# Patient Record
Sex: Female | Born: 1985 | Race: White | Hispanic: No | Marital: Married | Smoking: Never smoker
Health system: Southern US, Community
[De-identification: ages and names within clinical notes are randomized; demographics above are authoritative.]

## PROBLEM LIST (undated history)

## (undated) ENCOUNTER — Inpatient Hospital Stay (HOSPITAL_COMMUNITY): Payer: Self-pay

## (undated) DIAGNOSIS — IMO0002 Reserved for concepts with insufficient information to code with codable children: Secondary | ICD-10-CM

## (undated) DIAGNOSIS — O09299 Supervision of pregnancy with other poor reproductive or obstetric history, unspecified trimester: Secondary | ICD-10-CM

## (undated) DIAGNOSIS — O1490 Unspecified pre-eclampsia, unspecified trimester: Secondary | ICD-10-CM

## (undated) DIAGNOSIS — R87629 Unspecified abnormal cytological findings in specimens from vagina: Secondary | ICD-10-CM

## (undated) DIAGNOSIS — B977 Papillomavirus as the cause of diseases classified elsewhere: Secondary | ICD-10-CM

## (undated) DIAGNOSIS — F419 Anxiety disorder, unspecified: Secondary | ICD-10-CM

## (undated) HISTORY — DX: Unspecified pre-eclampsia, unspecified trimester: O14.90

## (undated) HISTORY — DX: Papillomavirus as the cause of diseases classified elsewhere: B97.7

## (undated) HISTORY — DX: Supervision of pregnancy with other poor reproductive or obstetric history, unspecified trimester: O09.299

## (undated) HISTORY — PX: WISDOM TOOTH EXTRACTION: SHX21

## (undated) HISTORY — PX: NO PAST SURGERIES: SHX2092

## (undated) HISTORY — DX: Unspecified abnormal cytological findings in specimens from vagina: R87.629

## (undated) HISTORY — DX: Anxiety disorder, unspecified: F41.9

## (undated) HISTORY — DX: Reserved for concepts with insufficient information to code with codable children: IMO0002

---
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
## 2007-02-06 ENCOUNTER — Other Ambulatory Visit: Admission: RE | Admit: 2007-02-06 | Discharge: 2007-02-06 | Payer: Self-pay | Admitting: Otolaryngology

## 2007-07-27 IMAGING — CT CT CHEST W/O CM
2 of 8 series · 14 of 38 positions shown, 18 images · IV contrast (CONTRAST)
Comparison: NONE

CLINICAL DATA: Shortness of breath.  Pain with deep inhalation 
times  12 hours. 

CT CHEST WITH INTRAVENOUS CONTRAST FOR EVALUATION OF PULMONARY 
EMBOLI
TECHNIQUE: Axial scans were obtained from the apices through the 
hemidiaphragms after administration of intravenous contrast 
material.  Coronal and oblique reconstructed images were 
performed.

[Series 3: arterial 2x2 · axial · arterial · 0.61mm/px · z∈[+29,+271]mm · 13 of 144 slices shown, 17 images]
[im 12/144  mediastinal]
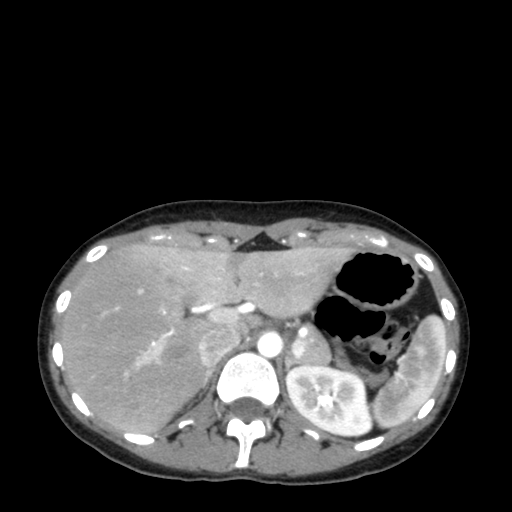
[im 12/144  lung]
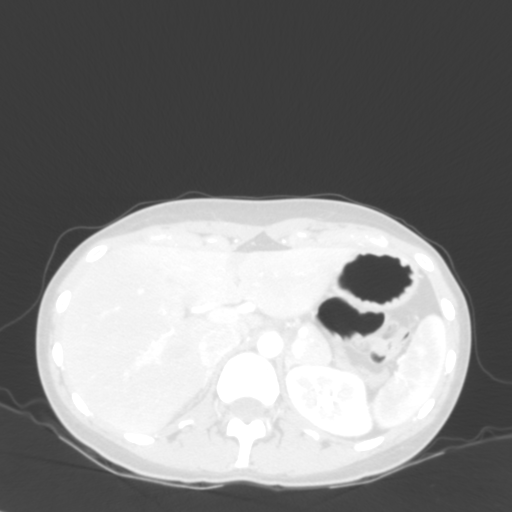
[im 23/144  lung]
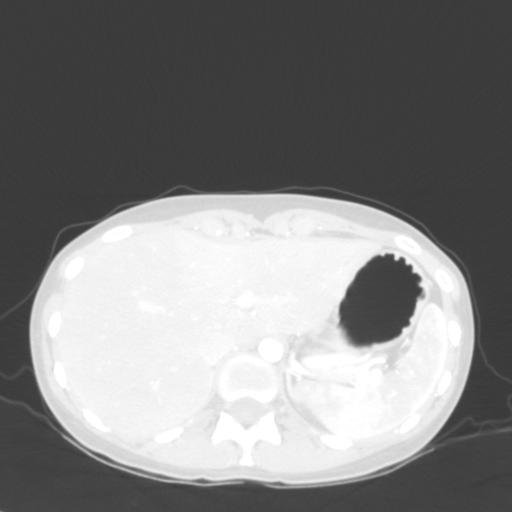
[im 34/144  lung]
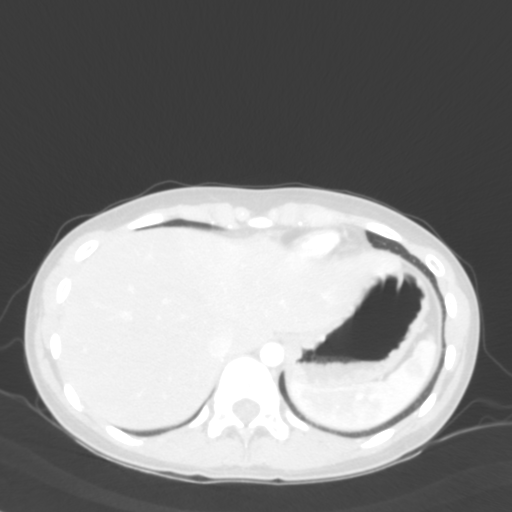
[im 45/144  lung]
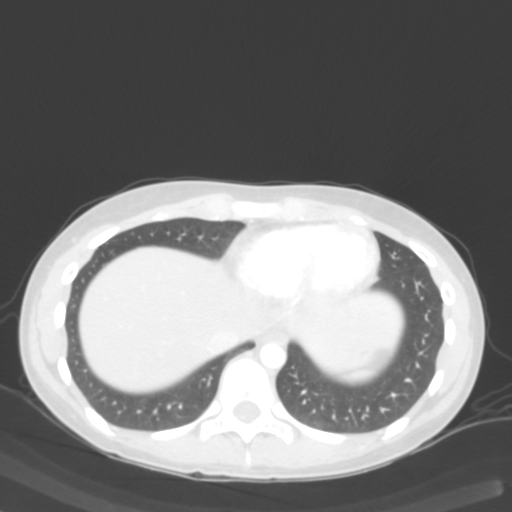
[im 56/144  mediastinal]
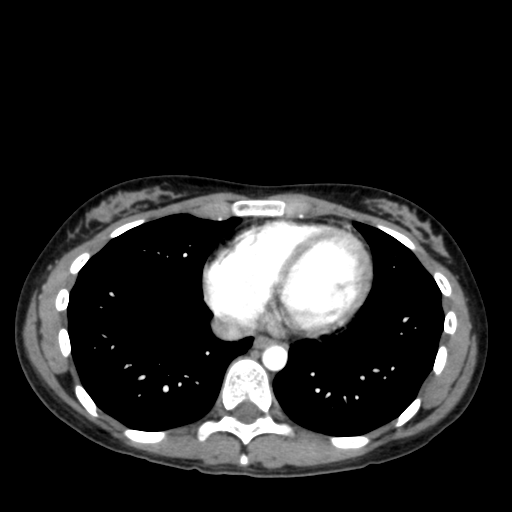
[im 56/144  lung]
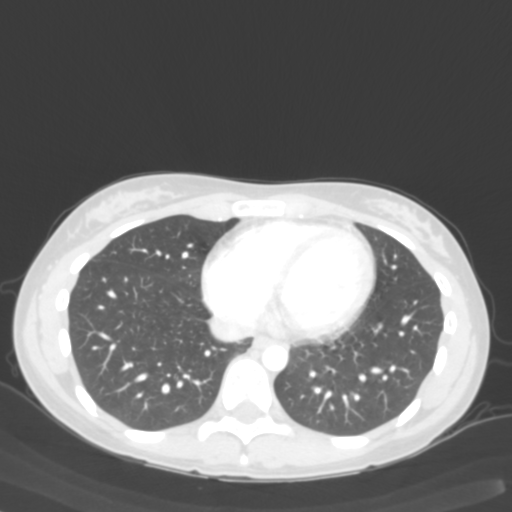
[im 67/144  lung]
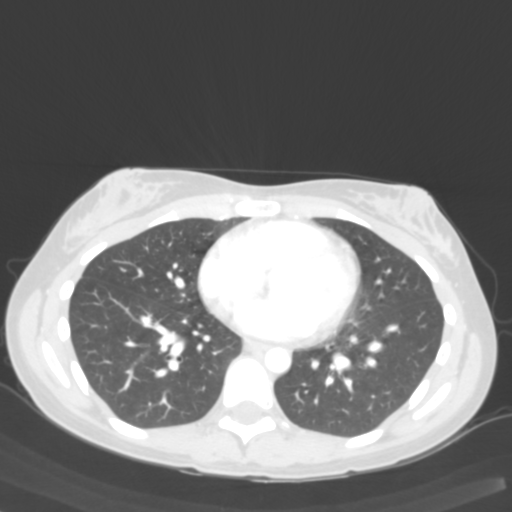
[im 71/144  lung]
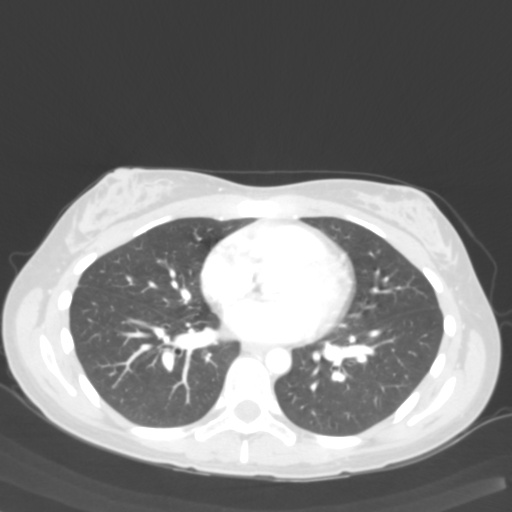
[im 78/144  lung]
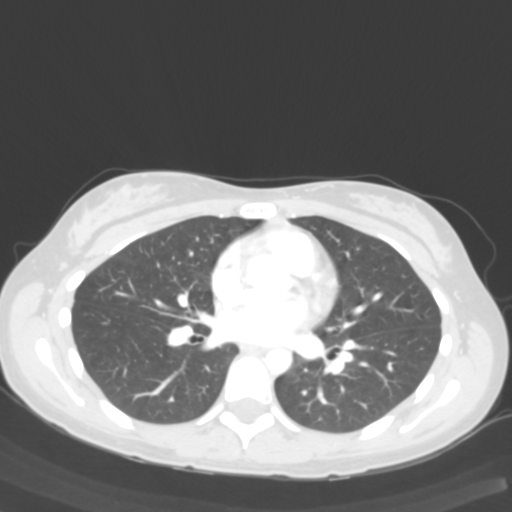
[im 89/144  mediastinal]
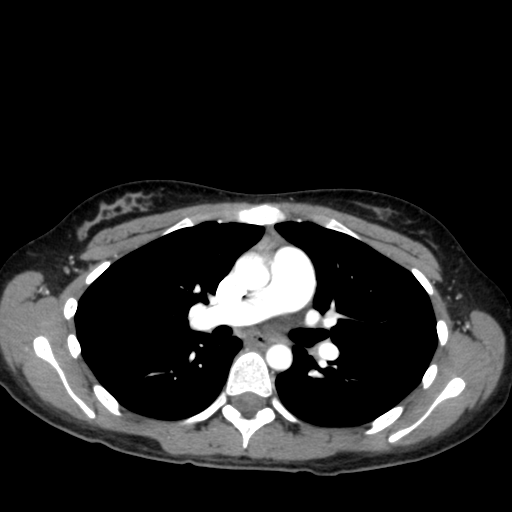
[im 89/144  lung]
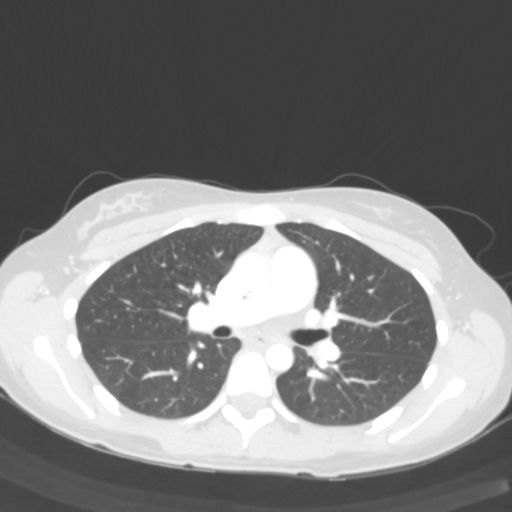
[im 100/144  lung]
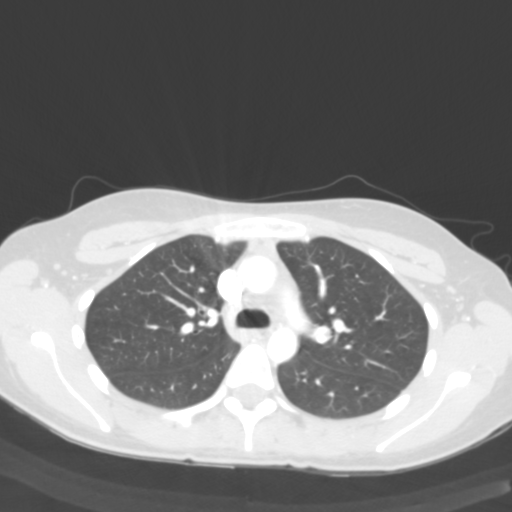
[im 111/144  lung]
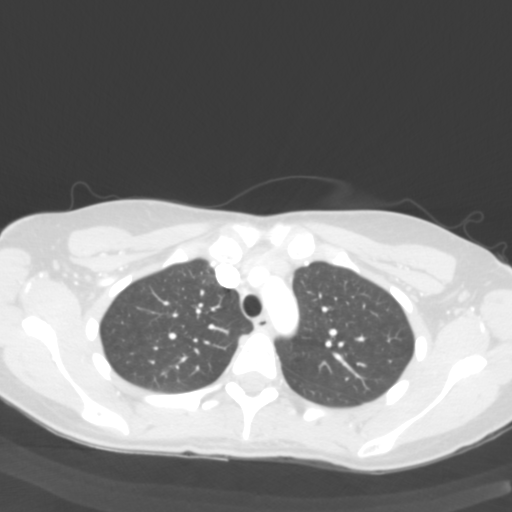
[im 122/144  lung]
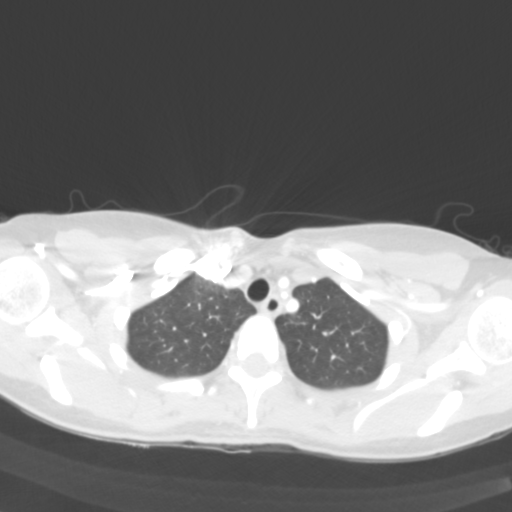
[im 133/144  mediastinal]
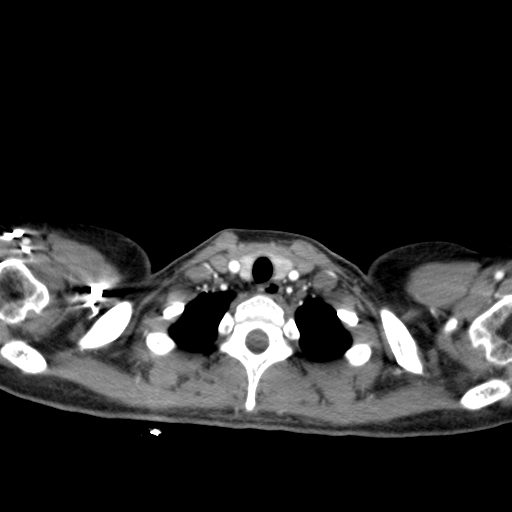
[im 133/144  lung]
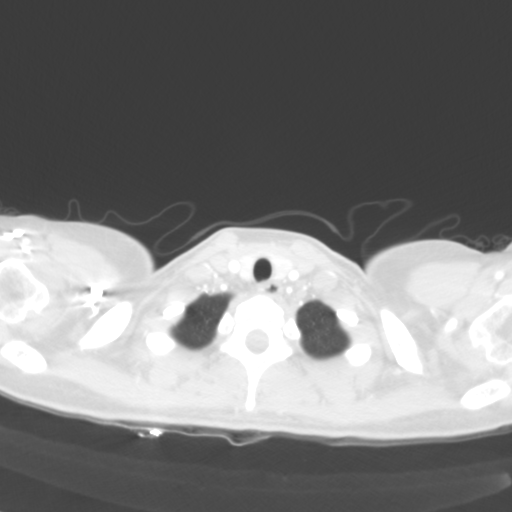

[coronals · coronal · 0.61mm/px · 1 of 71 slices shown]
[im 36/71  lung]
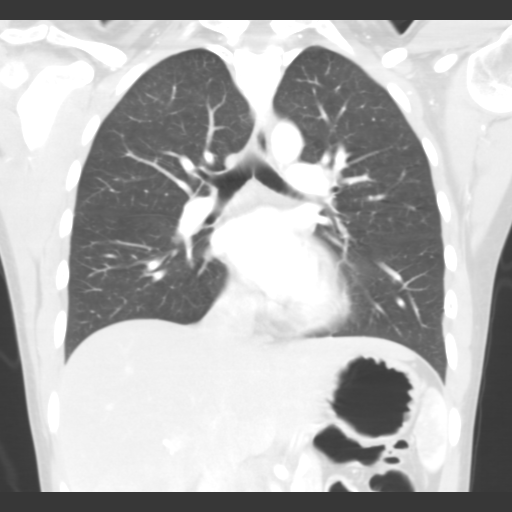

[14 of 38 positions shown; findings below may reference images not displayed]

FINDINGS: No intraluminal filling defects are identified to 
suggest pulmonary thromboembolism.  No hilar or mediastinal 
lymphadenopathy is identified.  Heart size is normal.  Lungs are 
clear of infiltrates, masses, nodules.  No pleural effusions are 
identified.  Those portions of the abdominal viscera visualized 
are grossly normal.
IMPRESSION: Normal CTA chest. Rogatien Guea M.D.

## 2008-03-13 ENCOUNTER — Other Ambulatory Visit: Admission: RE | Admit: 2008-03-13 | Discharge: 2008-03-13 | Payer: Self-pay | Admitting: Gynecology

## 2008-04-03 ENCOUNTER — Ambulatory Visit (HOSPITAL_COMMUNITY): Admission: RE | Admit: 2008-04-03 | Discharge: 2008-04-03 | Payer: Self-pay | Admitting: Family Medicine

## 2008-04-03 IMAGING — CT CT HEAD W/O CM
1 series · 16 of 30 positions shown, 20 images · non-contrast
Comparison: No priors

CLINICAL DATA: Right sided headache/facial and hand paresthesia

CT HEAD WITHOUT CONTRAST
TECHNIQUE: Contiguous axial images were obtained from the base of
the skull through the vertex without contrast.

[Series 2: headseq 4.8 h45s · axial · 0.43mm/px · z∈[-149,-21]mm · 16 of 30 slices shown, 20 images]
[im 2/30  brain]
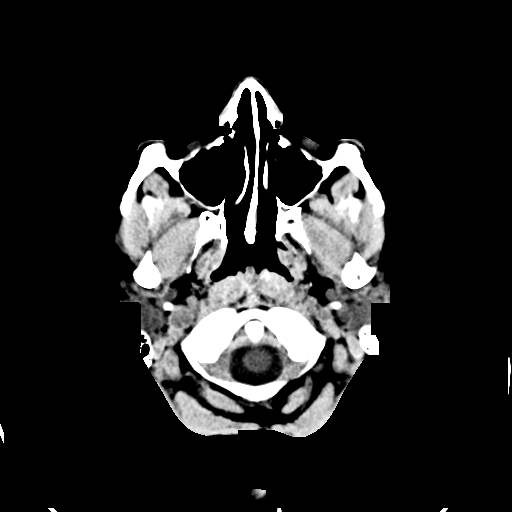
[im 2/30  bone]
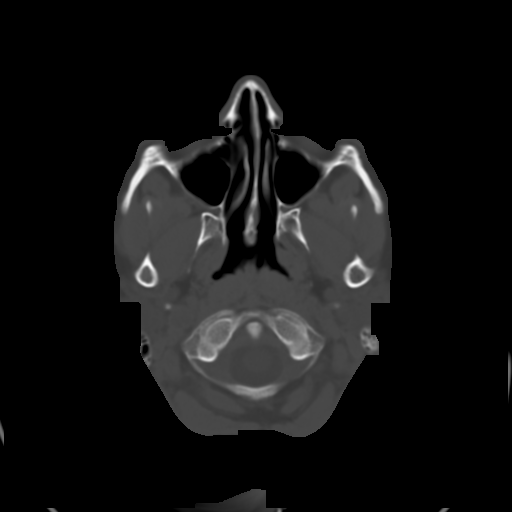
[im 4/30  brain]
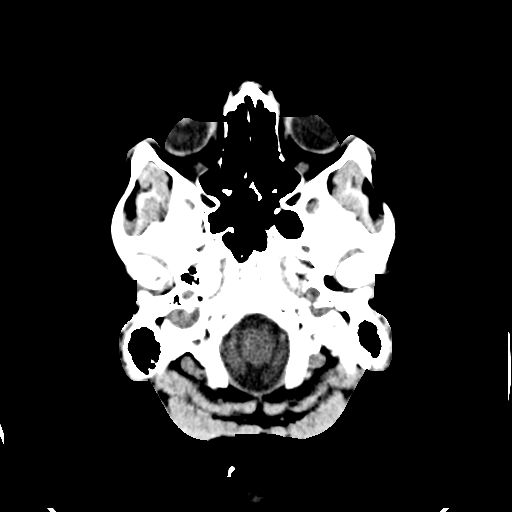
[im 6/30  brain]
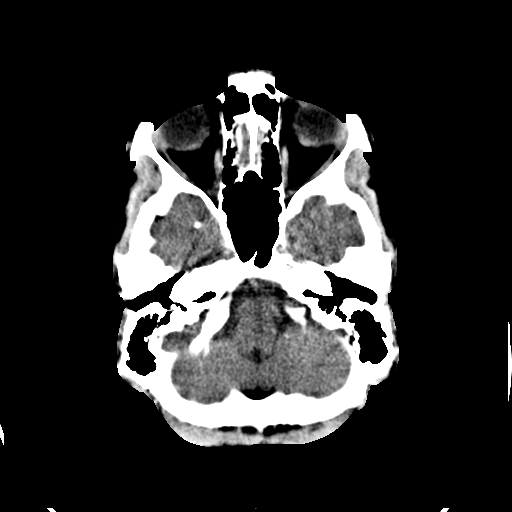
[im 8/30  brain]
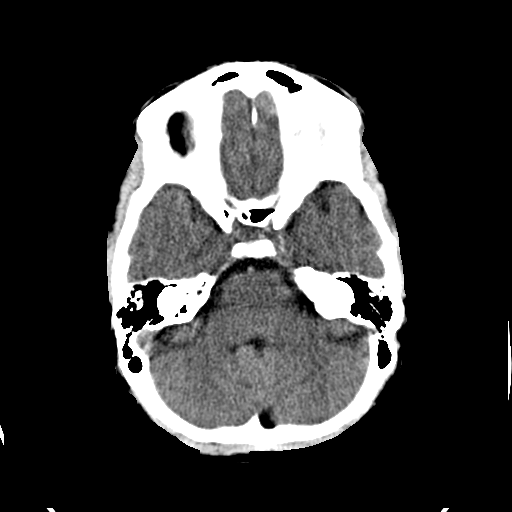
[im 9/30  brain]
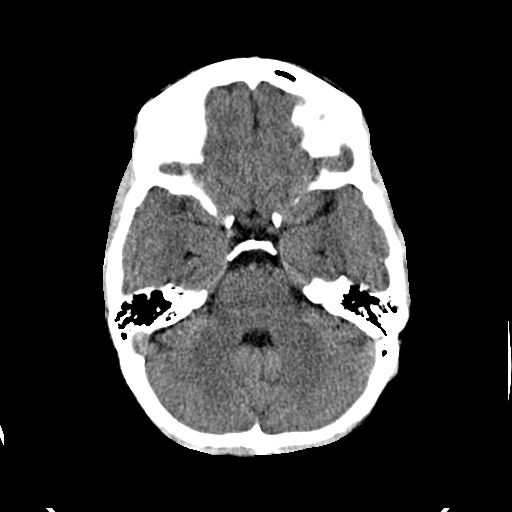
[im 9/30  bone]
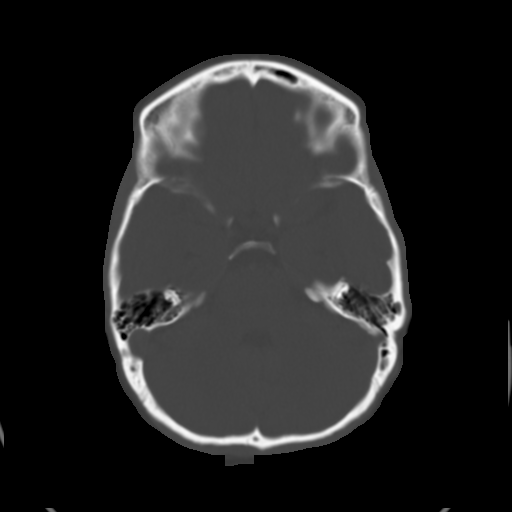
[im 11/30  brain]
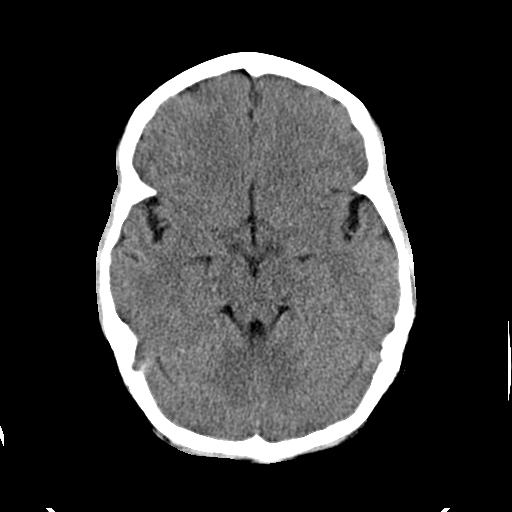
[im 13/30  brain]
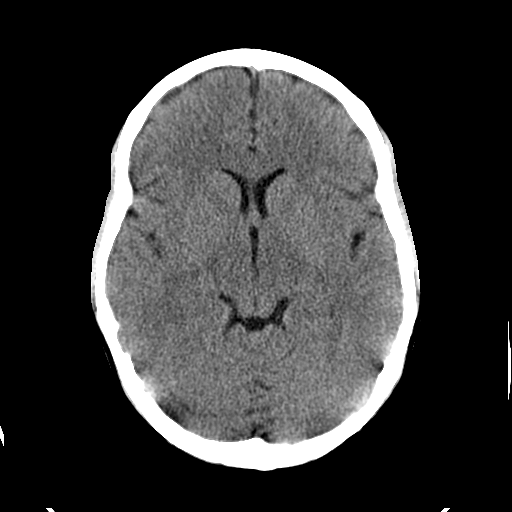
[im 15/30  brain]
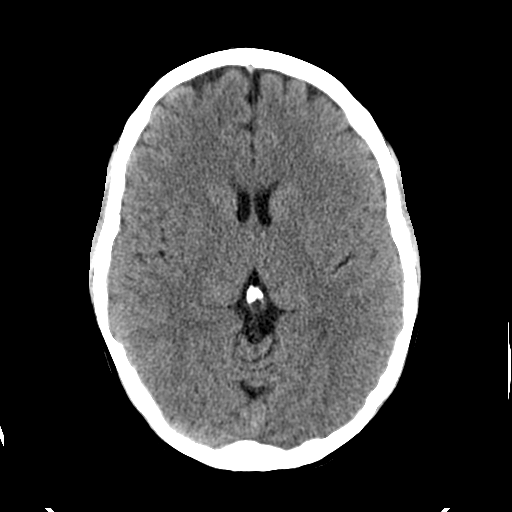
[im 16/30  brain]
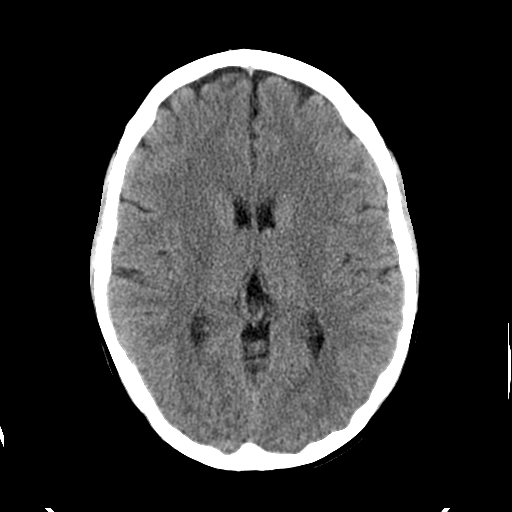
[im 16/30  bone]
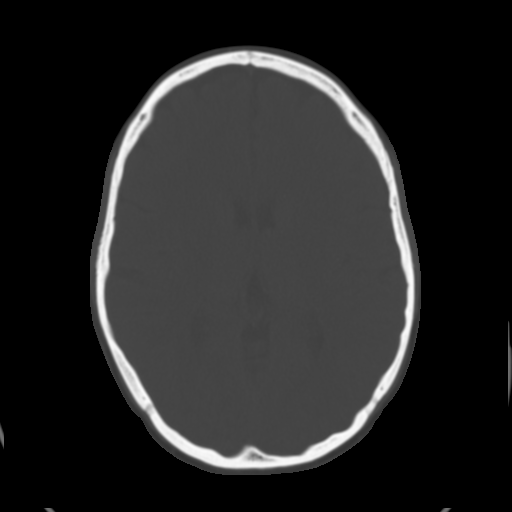
[im 18/30  brain]
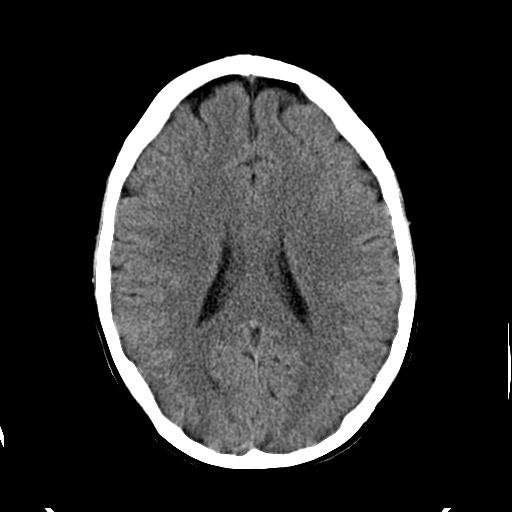
[im 20/30  brain]
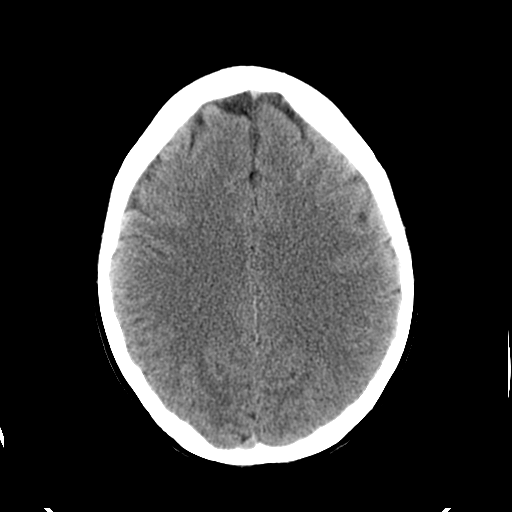
[im 22/30  brain]
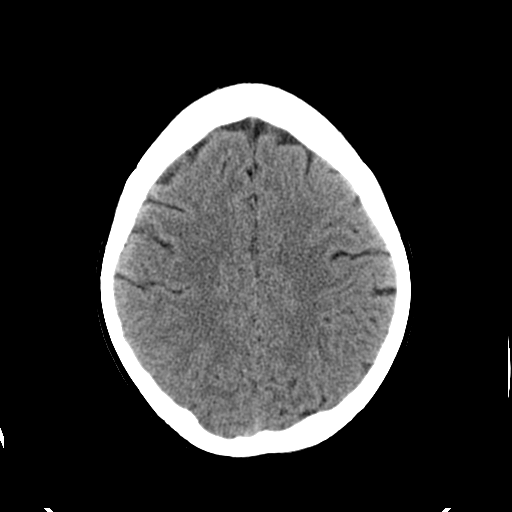
[im 23/30  brain]
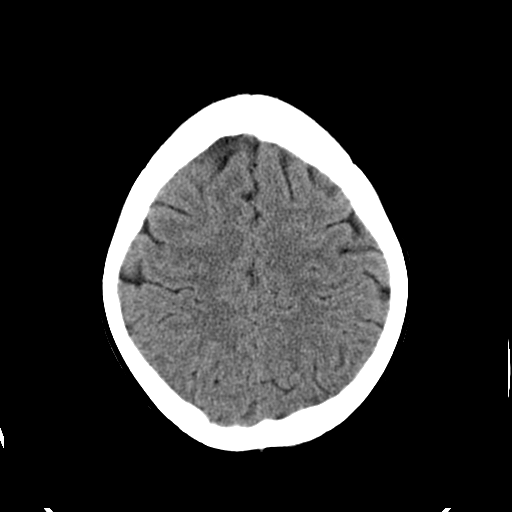
[im 23/30  bone]
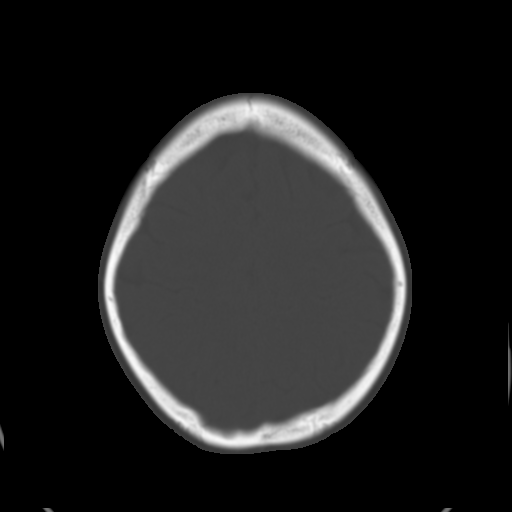
[im 25/30  brain]
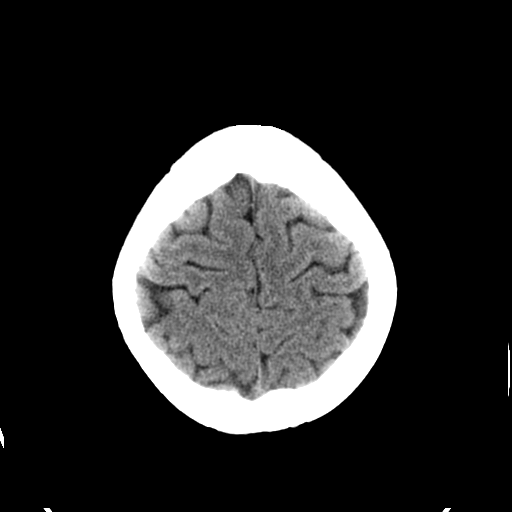
[im 27/30  brain]
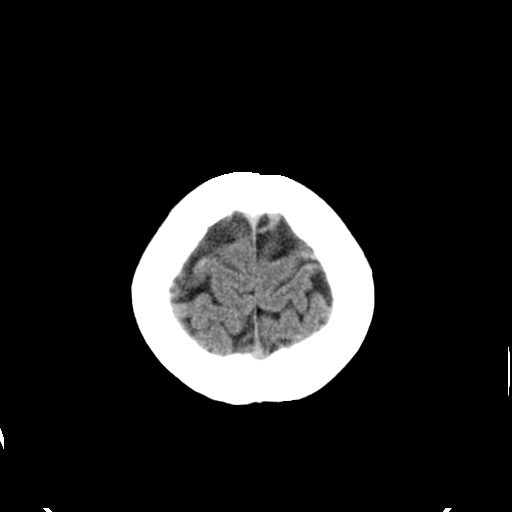
[im 29/30  brain]
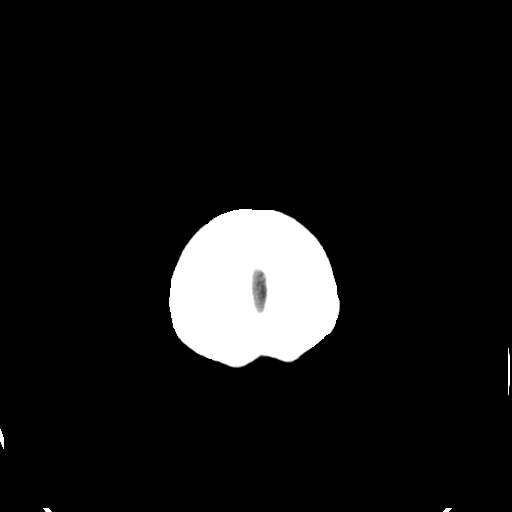

[16 of 30 positions shown; findings below may reference images not displayed]

FINDINGS: [Ventricular size and CSF spaces within normal limits. No
evidence for acute infarct or bleed.  No mass effect. Calvarium
intact.  No fluid in the sinuses visualized.
IMPRESSION: No acute or focal abnormality.

## 2008-06-20 ENCOUNTER — Ambulatory Visit (HOSPITAL_COMMUNITY): Admission: RE | Admit: 2008-06-20 | Discharge: 2008-06-20 | Payer: Self-pay | Admitting: Nephrology

## 2008-06-20 IMAGING — CT CT ANGIO ABDOMEN
3 of 9 series · 12 of 46 positions shown, 18 images · IV contrast (APPLIED)
Comparison: None

CTA ABDOMEN

CLINICAL DATA: Elevated blood pressure 10 years.

CT ANGIOGRAPHY  ABDOMEN AND PELVIS
TECHNIQUE: Multidetector CT imaging through the abdomen and pelvis
using the standard protocol during bolus administration of
intravenous contrast. Multiplanar reconstructed images obtained and
reviewed to evaluate the vascular anatomy. 100 ml Omnipaque 350

[Series 5: abd/ w/o 5.0 b30f st · axial · non-contrast · 0.64mm/px · z∈[-600,-335]mm · 4 of 89 slices shown]
[im 18/89  soft-tissue]
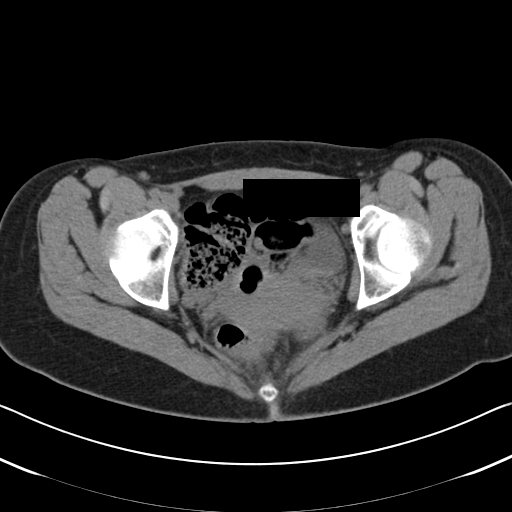
[im 36/89  soft-tissue]
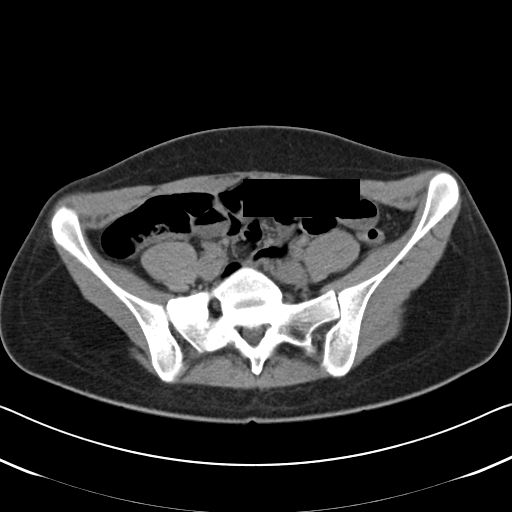
[im 53/89  soft-tissue]
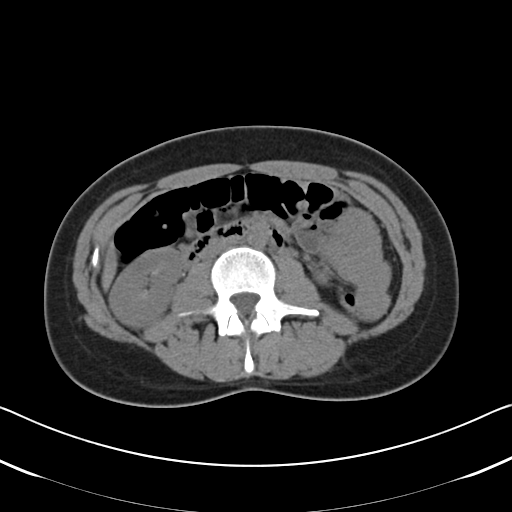
[im 71/89  soft-tissue]
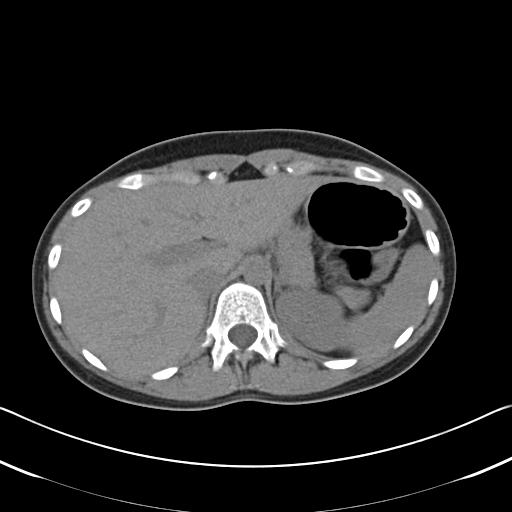

[Series 8: abd with 5.0 b30f st · axial · 0.66mm/px · z∈[-615,-320]mm · 5 of 89 slices shown, 10 images]
[im 15/89  soft-tissue]
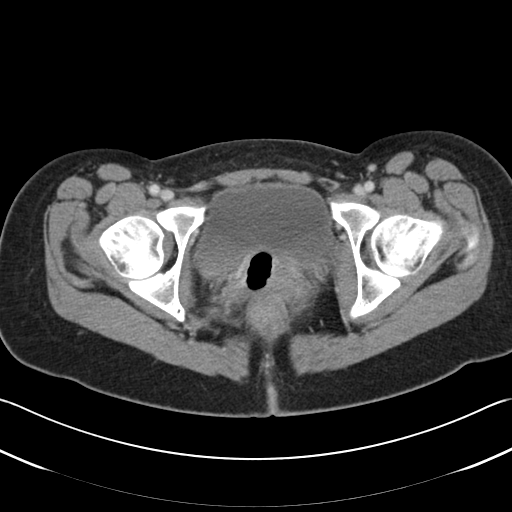
[im 15/89  bone]
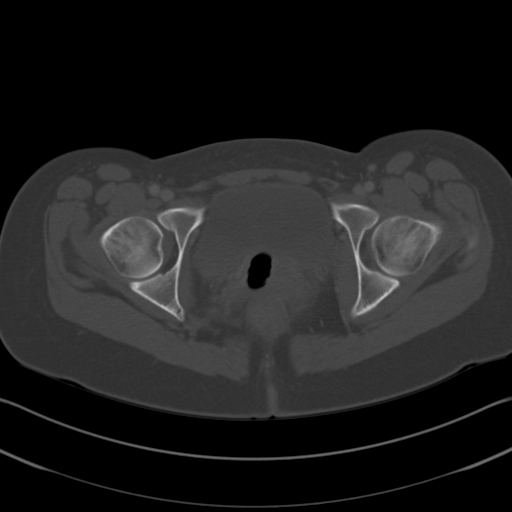
[im 30/89  soft-tissue]
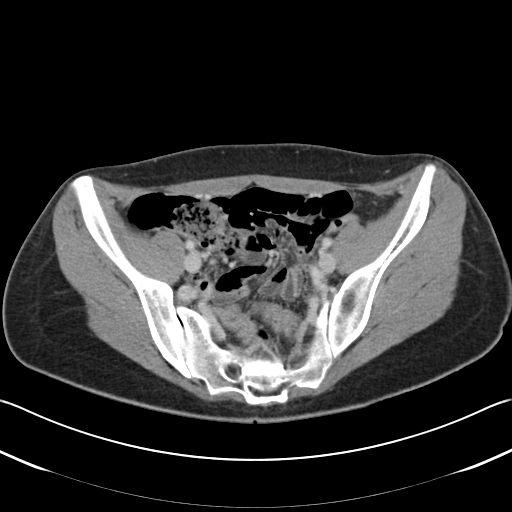
[im 30/89  lung]
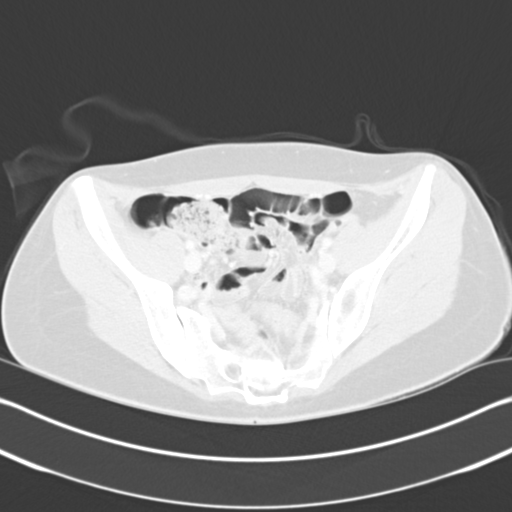
[im 45/89  soft-tissue]
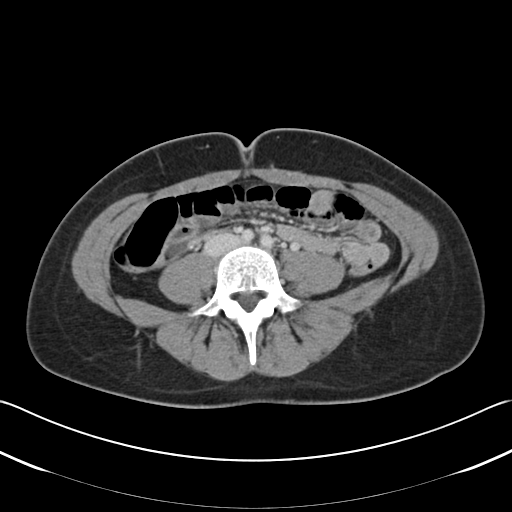
[im 45/89  lung]
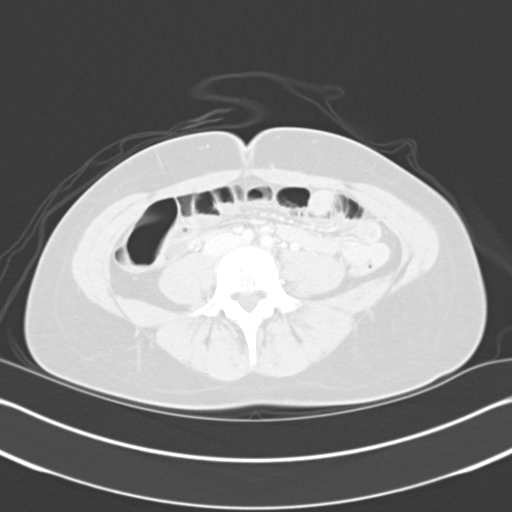
[im 59/89  soft-tissue]
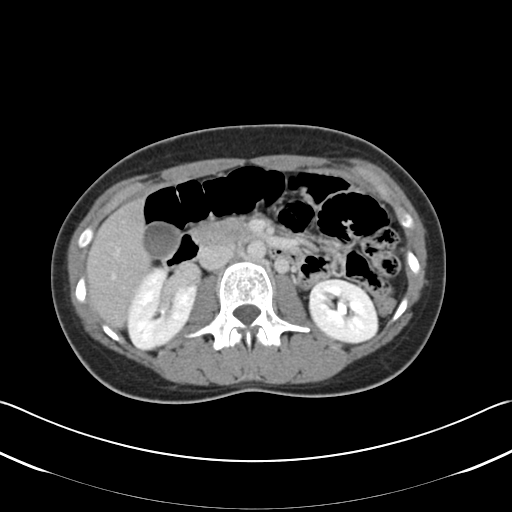
[im 59/89  lung]
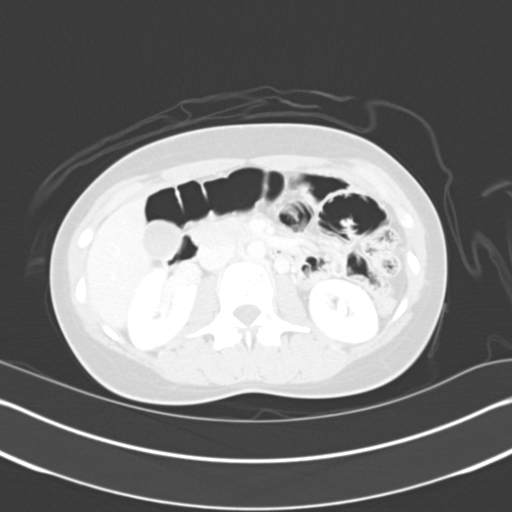
[im 74/89  soft-tissue]
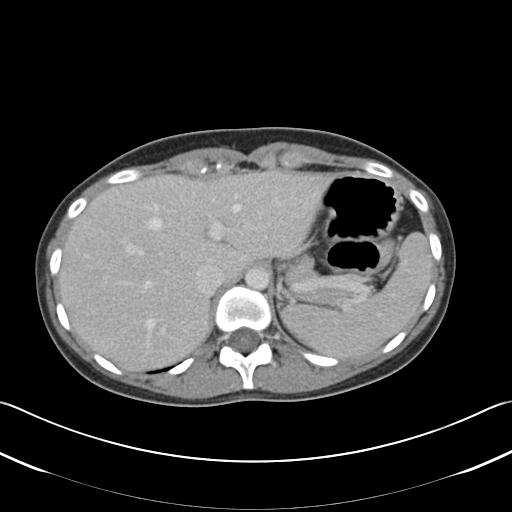
[im 74/89  lung]
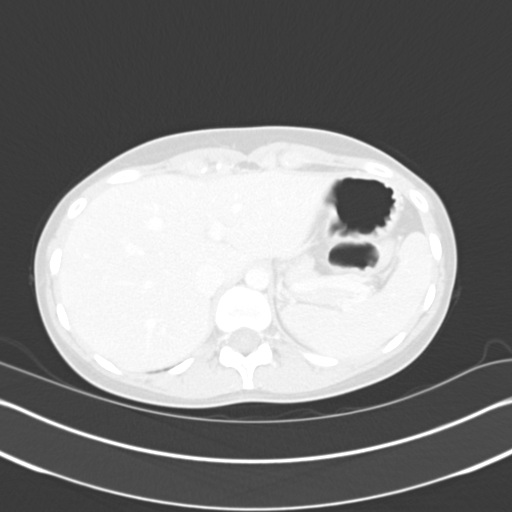

[Series 605: mpr cor ven · coronal · 0.87mm/px · 3 of 82 slices shown, 4 images]
[im 21/82  soft-tissue]
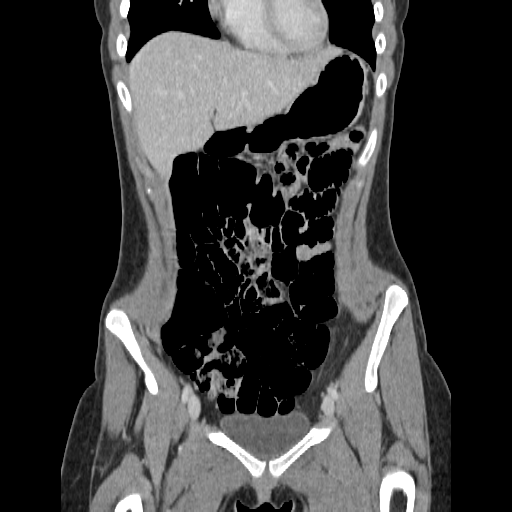
[im 41/82  soft-tissue]
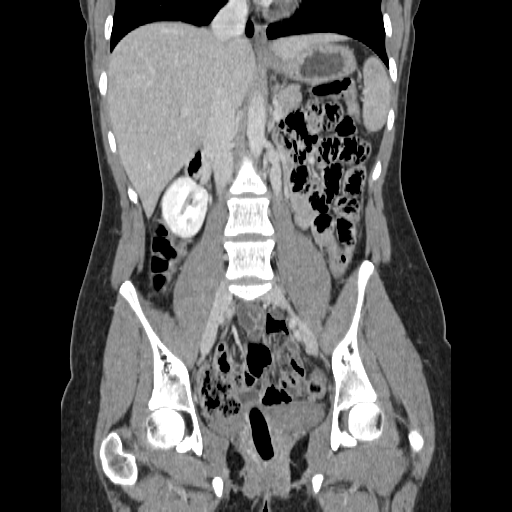
[im 41/82  bone]
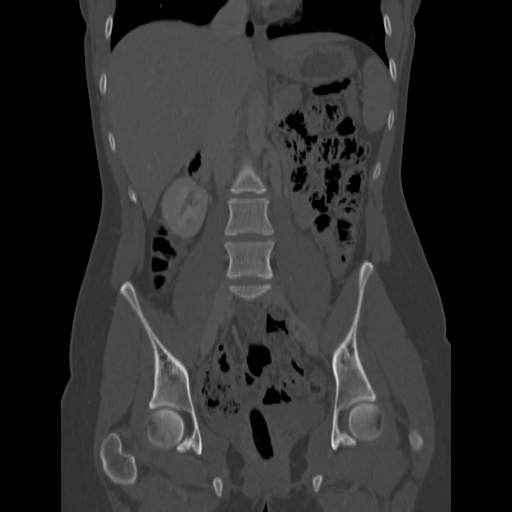
[im 61/82  soft-tissue]
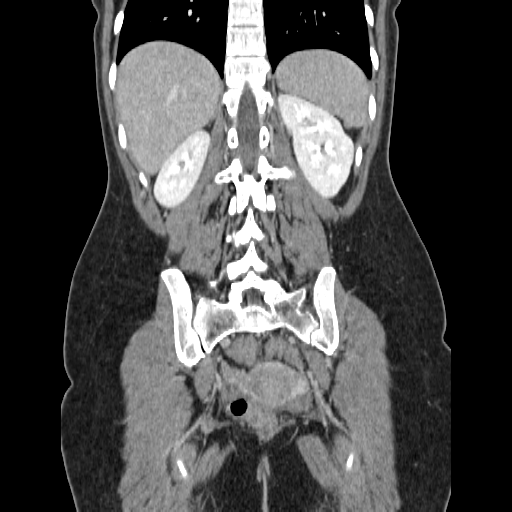

[12 of 46 positions shown; findings below may reference images not displayed]

FINDINGS: Single renal arteries are widely patent.  There is no
beading of the vessels to suggest vasculitis or fibromuscular
dysplasia.  The aorta is nonaneurysmal and patent.  The celiac
axis, SMA, and IMA are patent.  There is a prominent left ovarian
vein which fills with contrast consistent with venous
insufficiency.  The liver, gallbladder, spleen, kidneys, adrenal
glands, and pancreas are within normal limits.  Renal veins are
patent.  The IVC and portal vein are patent.  Negative free fluid
or abnormal adenopathy.  An L4-5 central disc herniation is
present.
IMPRESSION: No evidence of renal artery stenosis.

Venous insufficiency in the left ovarian vein.  This can be
associated with pelvic congestion syndrome.

L4-5 disc herniation.

CTA PELVIS
FINDINGS: Bilateral common iliac, external iliac, and internal
iliac arteries are patent.  Contrast filling the left ovarian vein
is noted again consistent with venous insufficiency.  The bladder,
uterus, and adnexa are within normal limits.  A tampon is present
in the vagina.  The right side of L5 is sacralized with a
pseudoarthrosis.
IMPRESSION: No arterial occlusive disease of the iliac arteries.

## 2008-12-31 ENCOUNTER — Ambulatory Visit: Payer: Self-pay | Admitting: Women's Health

## 2009-01-05 ENCOUNTER — Ambulatory Visit: Payer: Self-pay | Admitting: Women's Health

## 2009-03-09 ENCOUNTER — Ambulatory Visit: Payer: Self-pay | Admitting: Women's Health

## 2009-03-25 ENCOUNTER — Ambulatory Visit: Payer: Self-pay | Admitting: Women's Health

## 2009-03-25 ENCOUNTER — Other Ambulatory Visit: Admission: RE | Admit: 2009-03-25 | Discharge: 2009-03-25 | Payer: Self-pay | Admitting: Gynecology

## 2009-03-25 ENCOUNTER — Encounter: Payer: Self-pay | Admitting: Women's Health

## 2009-04-15 ENCOUNTER — Emergency Department (HOSPITAL_COMMUNITY): Admission: EM | Admit: 2009-04-15 | Discharge: 2009-04-15 | Payer: Self-pay | Admitting: Emergency Medicine

## 2009-04-15 IMAGING — CR DG NECK SOFT TISSUE
2 series · 2 of 2 positions shown · non-contrast
Comparison: None

CLINICAL DATA: Swelling

NECK SOFT TISSUES - 1+ VIEW

[w soft tissue neck *]
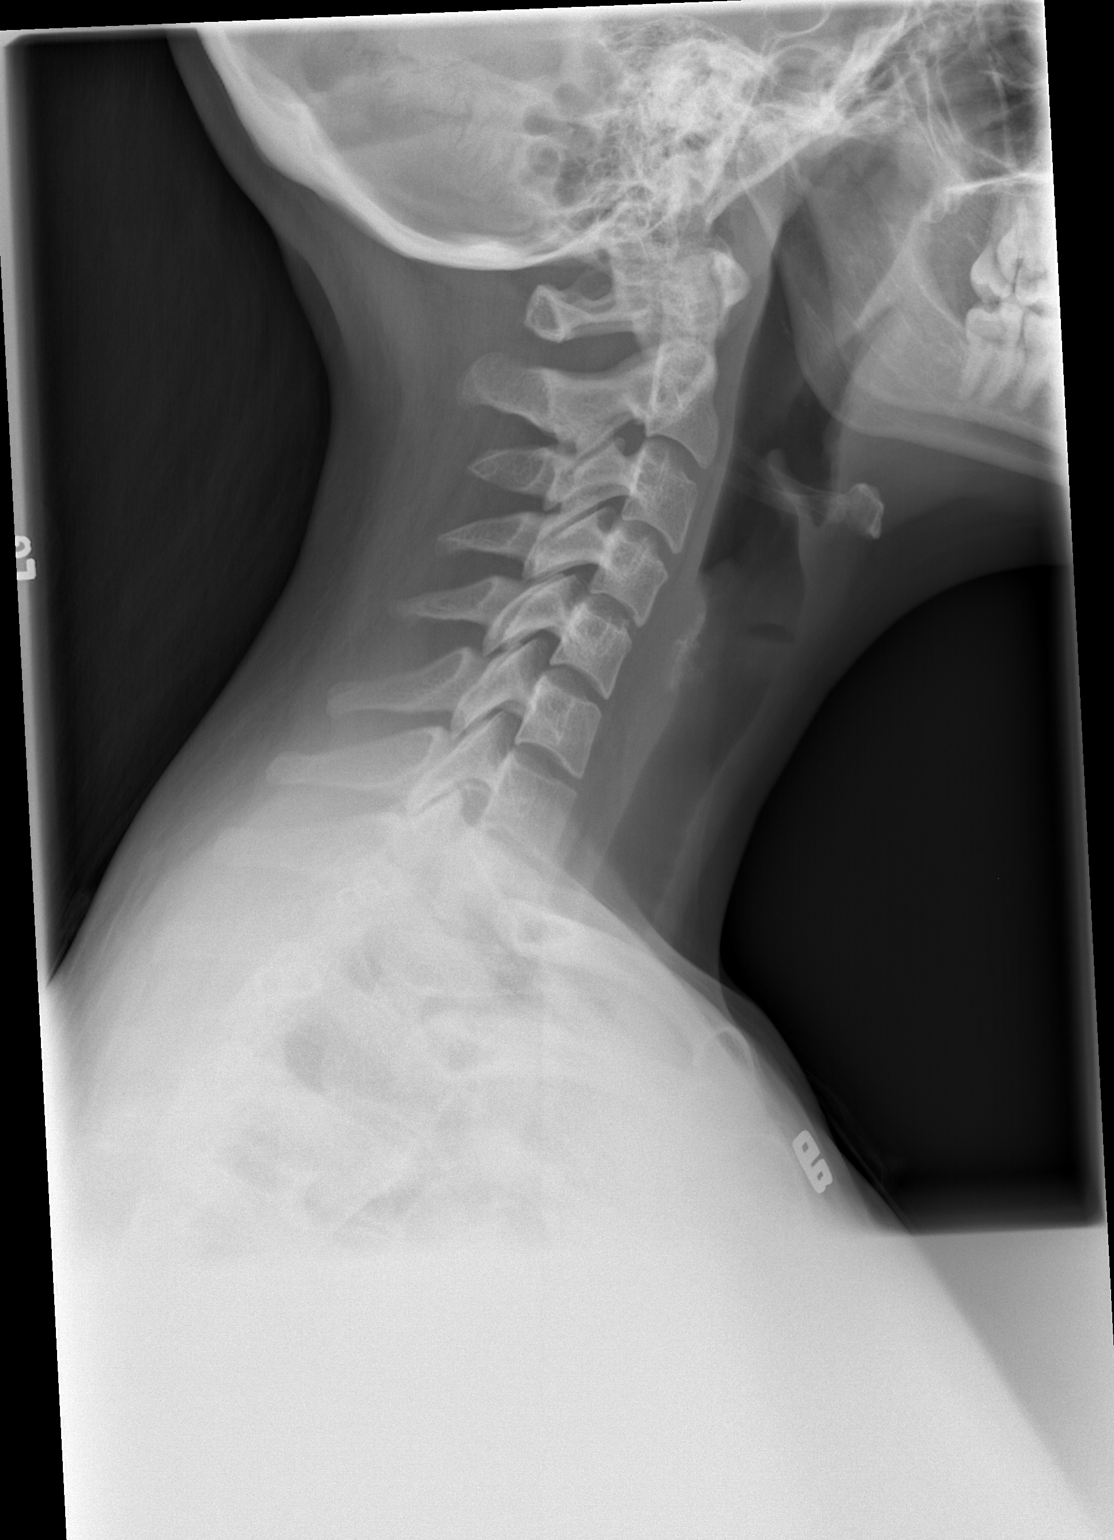

[w soft tissue neck]
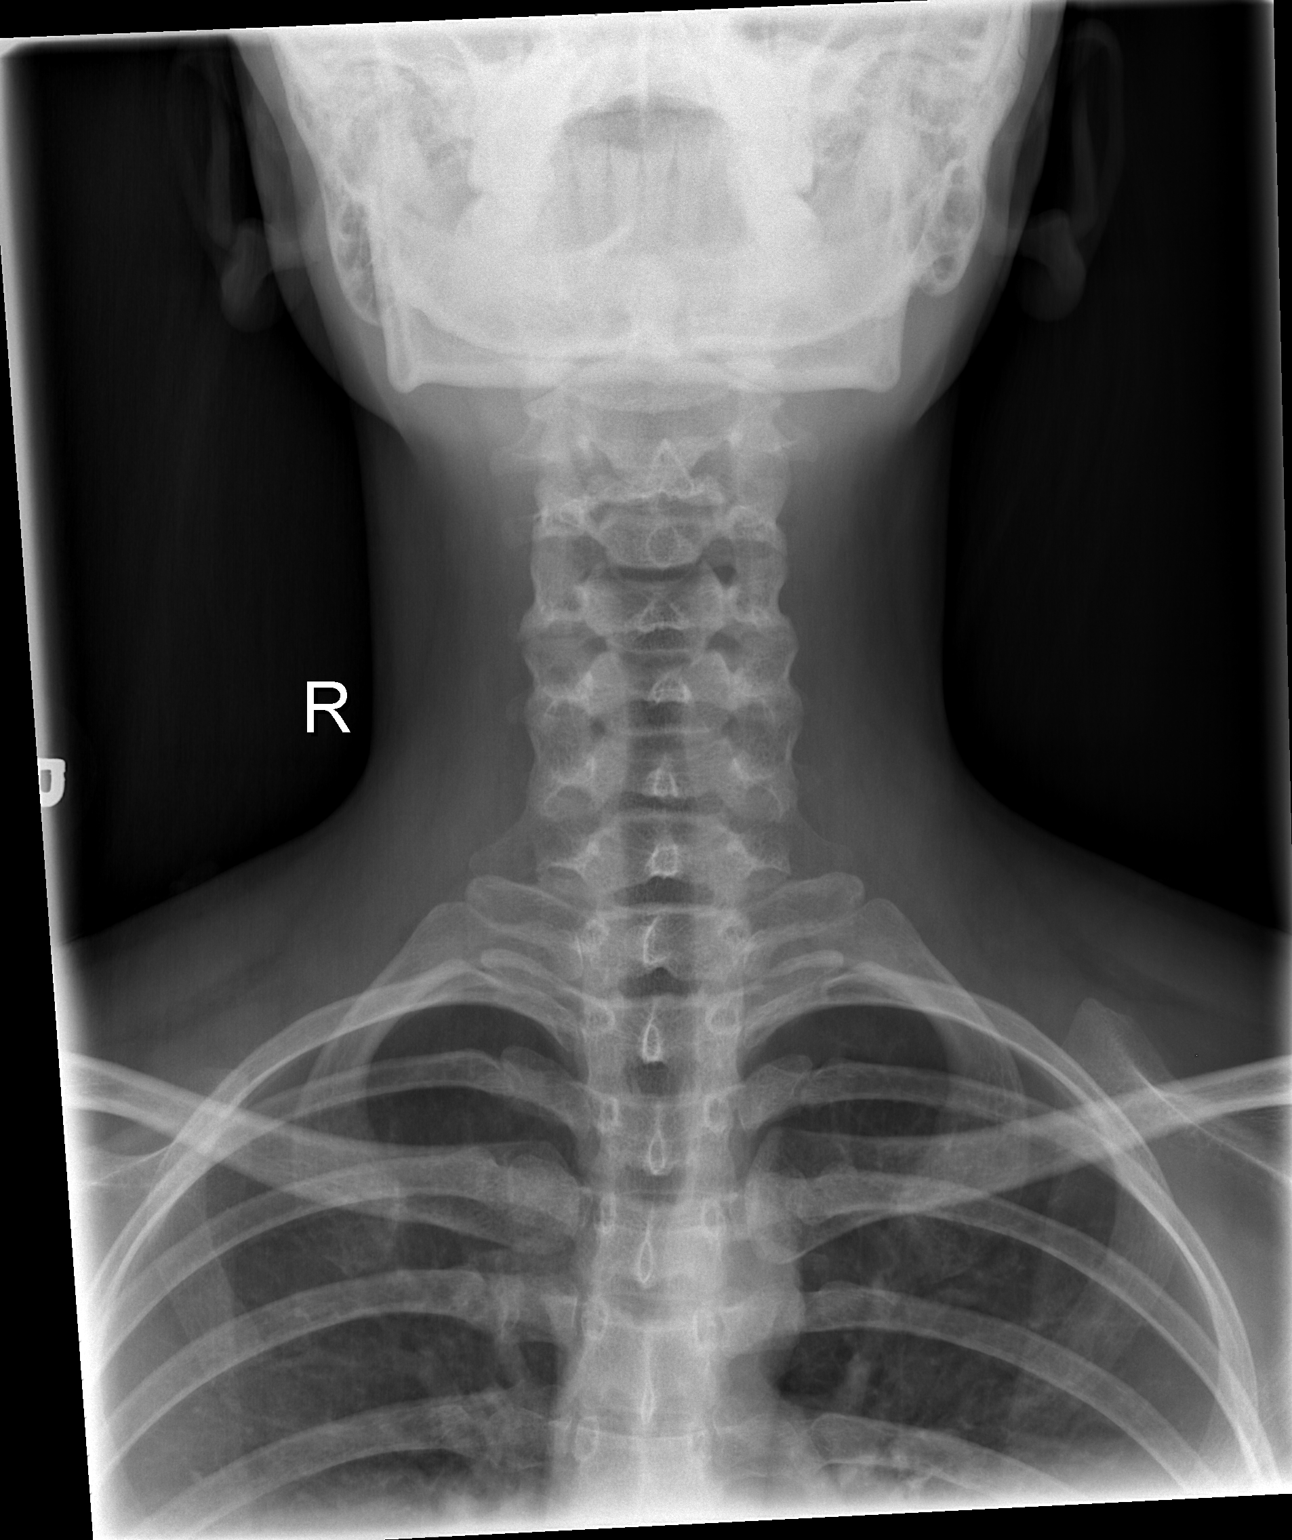

[2 of 2 positions shown; findings below may reference images not displayed]

FINDINGS: Aryepiglottic folds unremarkable.  No prevertebral soft
tissue swelling or retropharyngeal gas.  The airway is
unremarkable.  Visualized bones unremarkable.
IMPRESSION: Negative.

## 2009-04-29 ENCOUNTER — Emergency Department (HOSPITAL_COMMUNITY): Admission: EM | Admit: 2009-04-29 | Discharge: 2009-04-30 | Payer: Self-pay | Admitting: Emergency Medicine

## 2009-04-29 IMAGING — CR DG FOOT COMPLETE 3+V*R*
3 series · 3 of 3 positions shown · non-contrast
Comparison: None

CLINICAL DATA: Foot injury.  Lateral foot pain and swelling.

RIGHT FOOT COMPLETE - 3+ VIEW

[t foot ap right]
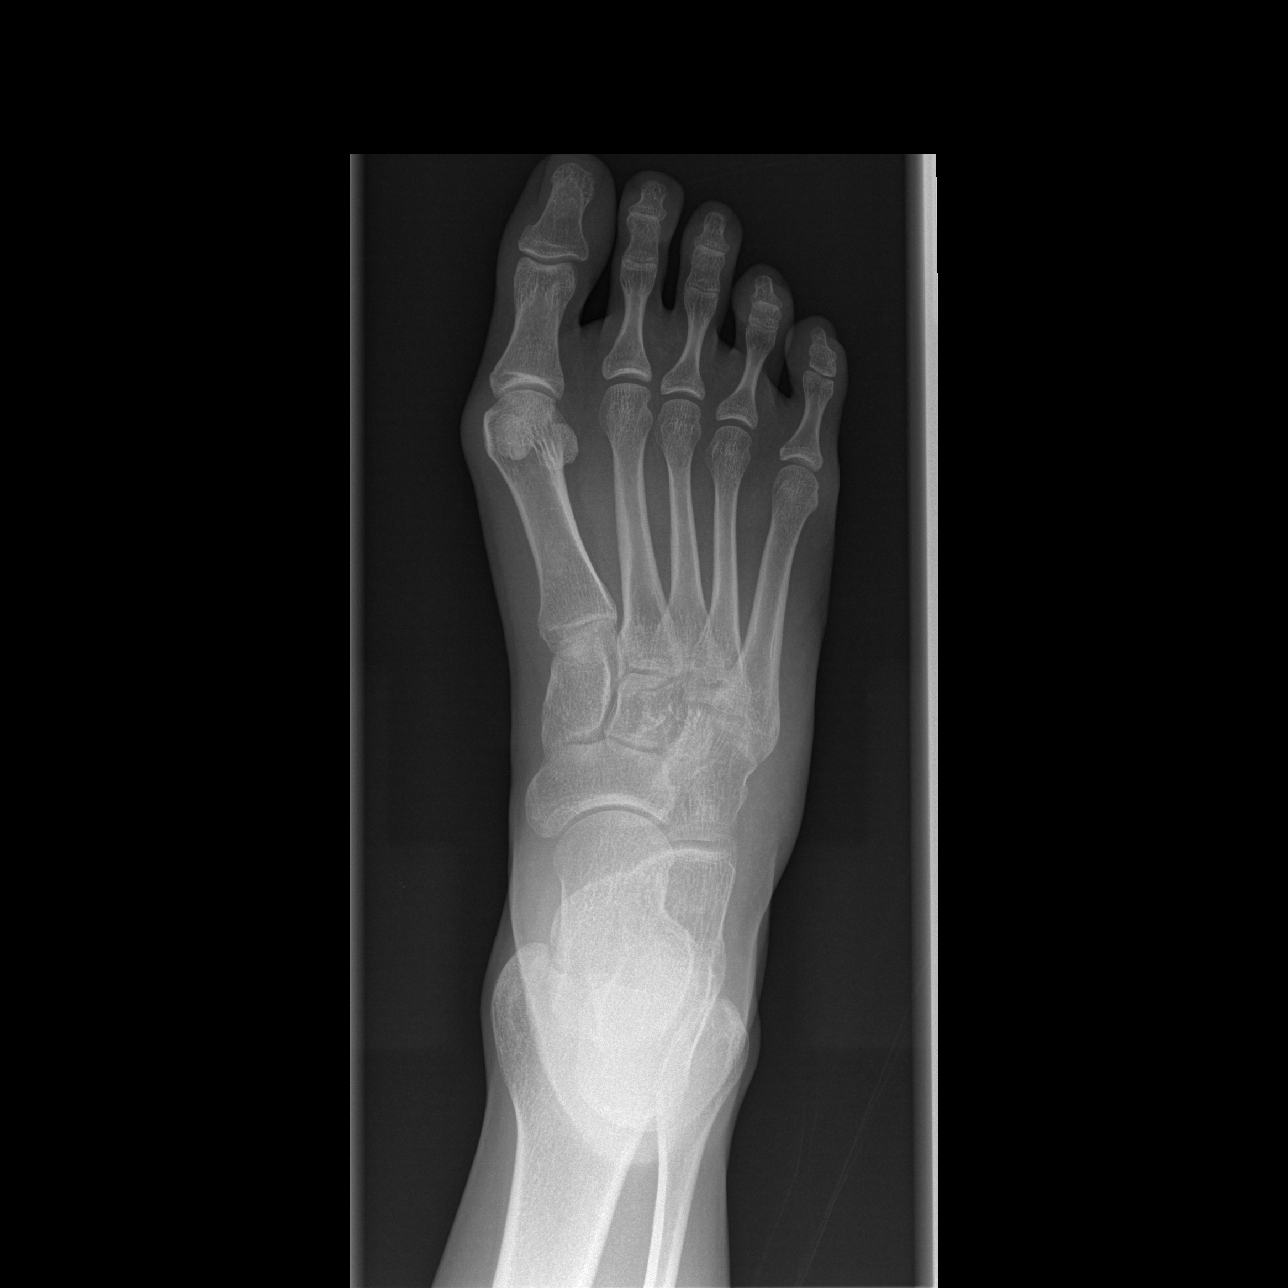

[t foot oblique right]
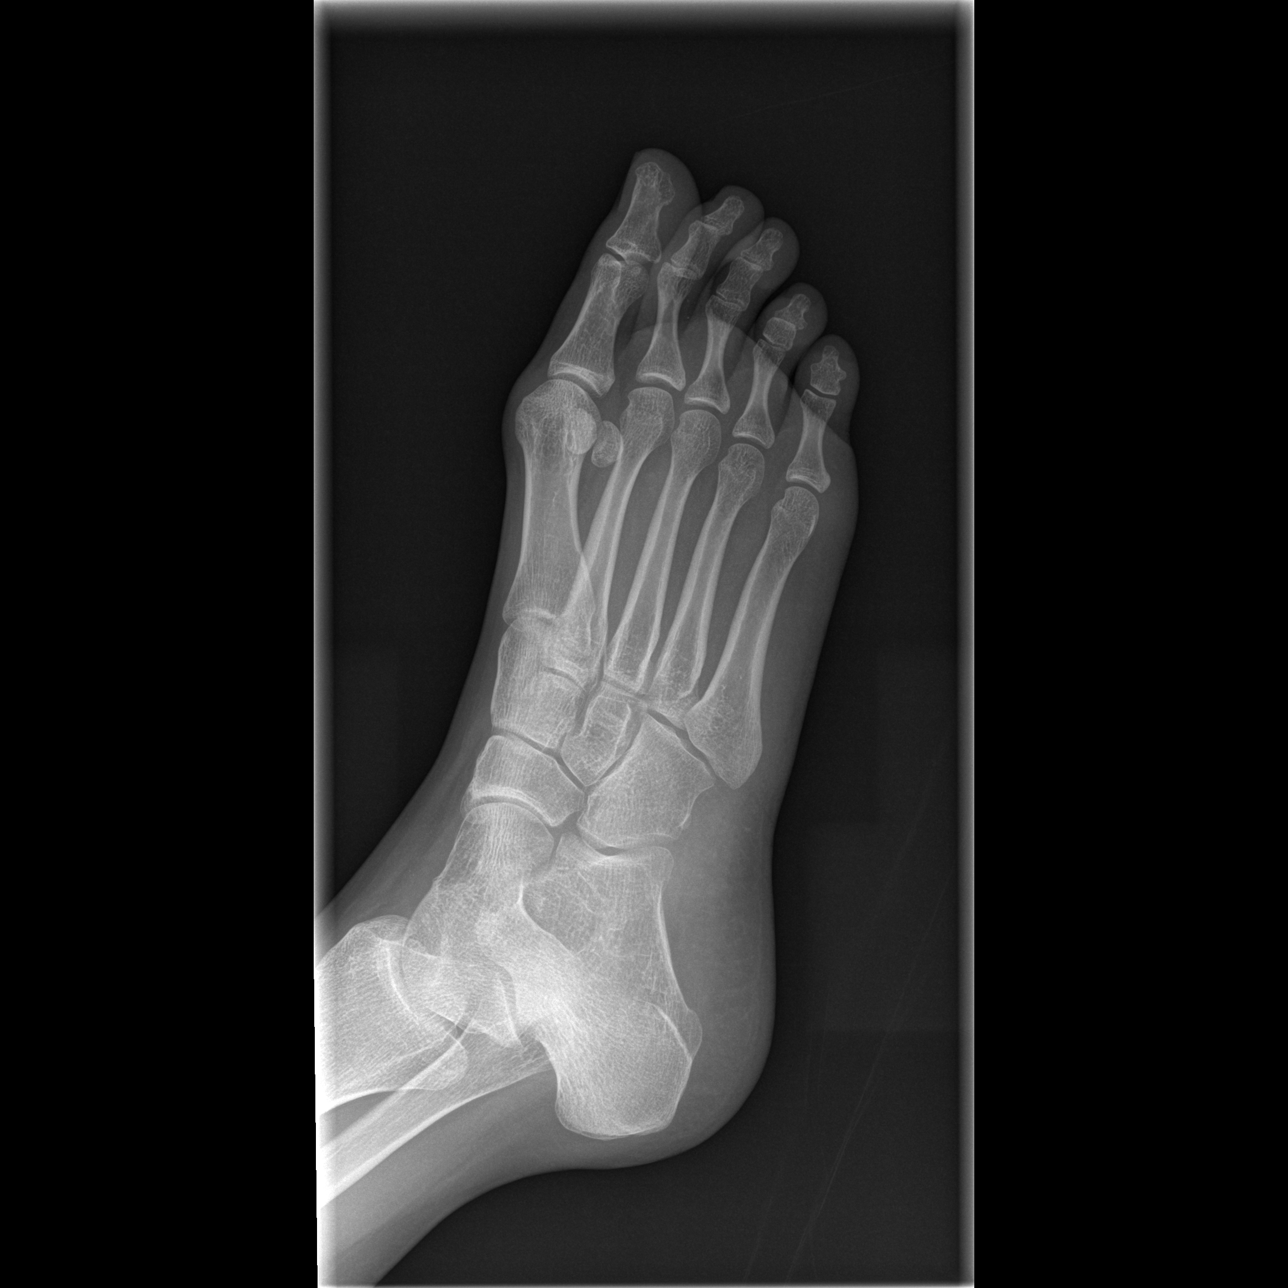

[t foot lat right]
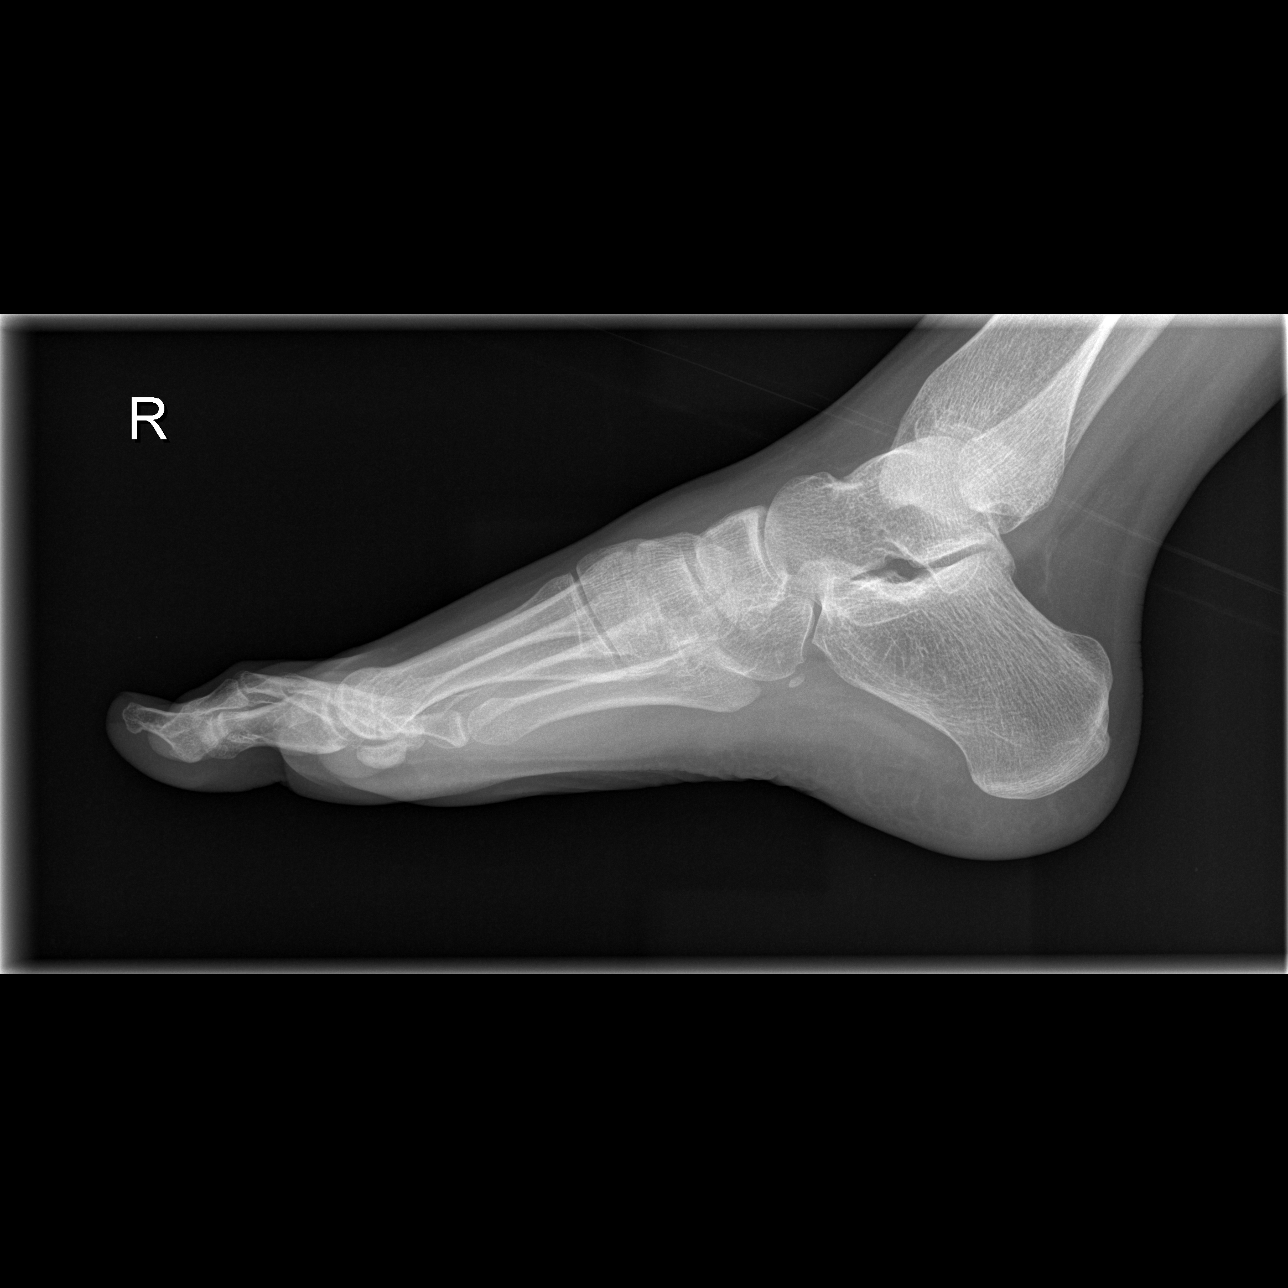

[3 of 3 positions shown; findings below may reference images not displayed]

FINDINGS: There is no evidence of fracture or dislocation.  Mild
hallux valgus deformity with bunion formation noted.
IMPRESSION: 1.  No acute findings.
2.  Mild bunion.

## 2009-05-27 ENCOUNTER — Encounter: Admission: RE | Admit: 2009-05-27 | Discharge: 2009-07-06 | Payer: Self-pay | Admitting: Sports Medicine

## 2009-07-14 ENCOUNTER — Emergency Department (HOSPITAL_COMMUNITY): Admission: EM | Admit: 2009-07-14 | Discharge: 2009-07-14 | Payer: Self-pay | Admitting: Emergency Medicine

## 2010-03-29 ENCOUNTER — Ambulatory Visit: Payer: Self-pay | Admitting: Women's Health

## 2010-03-29 ENCOUNTER — Other Ambulatory Visit: Admission: RE | Admit: 2010-03-29 | Discharge: 2010-03-29 | Payer: Self-pay | Admitting: Gynecology

## 2010-07-13 ENCOUNTER — Ambulatory Visit: Payer: Self-pay | Admitting: Gynecology

## 2010-09-28 ENCOUNTER — Ambulatory Visit (HOSPITAL_COMMUNITY)
Admission: RE | Admit: 2010-09-28 | Discharge: 2010-09-28 | Payer: Self-pay | Source: Home / Self Care | Attending: Psychiatry | Admitting: Psychiatry

## 2011-01-02 ENCOUNTER — Emergency Department (HOSPITAL_COMMUNITY): Payer: Medicaid Other

## 2011-01-02 ENCOUNTER — Emergency Department (HOSPITAL_COMMUNITY)
Admission: EM | Admit: 2011-01-02 | Discharge: 2011-01-02 | Disposition: A | Discharge status: Home / Self Care | Payer: Medicaid Other | Attending: Emergency Medicine | Admitting: Emergency Medicine

## 2011-01-02 DIAGNOSIS — N949 Unspecified condition associated with female genital organs and menstrual cycle: Secondary | ICD-10-CM | POA: Insufficient documentation

## 2011-01-02 DIAGNOSIS — R109 Unspecified abdominal pain: Secondary | ICD-10-CM | POA: Insufficient documentation

## 2011-01-02 DIAGNOSIS — I10 Essential (primary) hypertension: Secondary | ICD-10-CM | POA: Insufficient documentation

## 2011-01-02 DIAGNOSIS — F411 Generalized anxiety disorder: Secondary | ICD-10-CM | POA: Insufficient documentation

## 2011-01-02 LAB — CBC
MCH: 30.3 pg (ref 26.0–34.0)
MCV: 92.5 fL (ref 78.0–100.0)
Platelets: 281 10*3/uL (ref 150–400)
RDW: 13.7 % (ref 11.5–15.5)
WBC: 8.6 10*3/uL (ref 4.0–10.5)

## 2011-01-02 LAB — URINALYSIS, ROUTINE W REFLEX MICROSCOPIC
Glucose, UA: NEGATIVE mg/dL
Ketones, ur: NEGATIVE mg/dL
Protein, ur: NEGATIVE mg/dL

## 2011-01-02 LAB — WET PREP, GENITAL: Yeast Wet Prep HPF POC: NONE SEEN

## 2011-01-02 LAB — BASIC METABOLIC PANEL
BUN: 11 mg/dL (ref 6–23)
Calcium: 9.2 mg/dL (ref 8.4–10.5)
Creatinine, Ser: 0.77 mg/dL (ref 0.4–1.2)
GFR calc non Af Amer: >60 mL/min (ref >60)
Potassium: 3.9 mEq/L (ref 3.5–5.1)

## 2011-01-02 LAB — DIFFERENTIAL
Eosinophils Absolute: 0.2 10*3/uL (ref 0.0–0.7)
Eosinophils Relative: 2 % (ref 0–5)
Lymphs Abs: 2.8 10*3/uL (ref 0.7–4.0)

## 2011-01-02 LAB — POCT PREGNANCY, URINE: Preg Test, Ur: NEGATIVE

## 2011-01-02 IMAGING — CT CT ABD-PELV W/ CM
2 of 4 series · 17 of 46 positions shown, 19 images · IV contrast (APPLIED)
Comparison: 06/20/2008

CLINICAL DATA: Abdominal pain.  Pelvic pain for 2 weeks.  History
of alcohol and can this use.

CT ABDOMEN AND PELVIS WITH CONTRAST
TECHNIQUE: Multidetector CT imaging of the abdomen and pelvis was
performed following the standard protocol during bolus
administration of intravenous contrast.
Contrast: 125 ml [id number]-555

[Series 2: abd_pel 5.0 b40f st · axial · 0.72mm/px · z∈[-484,-44]mm · 14 of 96 slices shown, 16 images]
[im 4/96  soft-tissue]
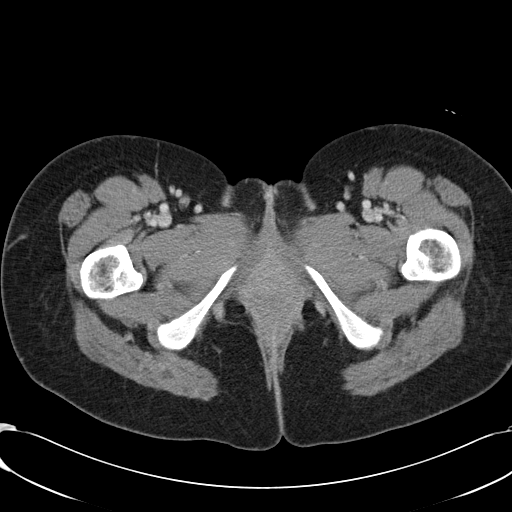
[im 4/96  bone]
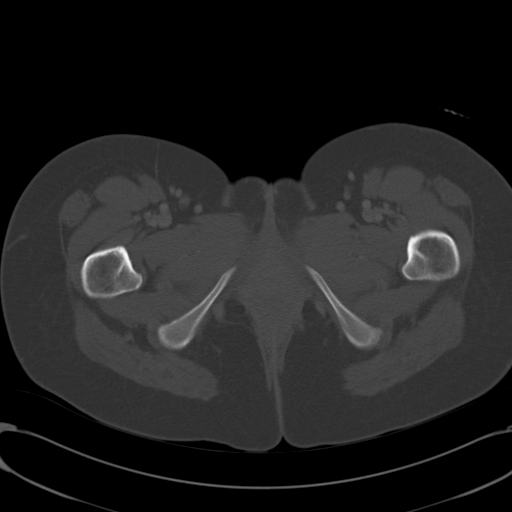
[im 12/96  soft-tissue]
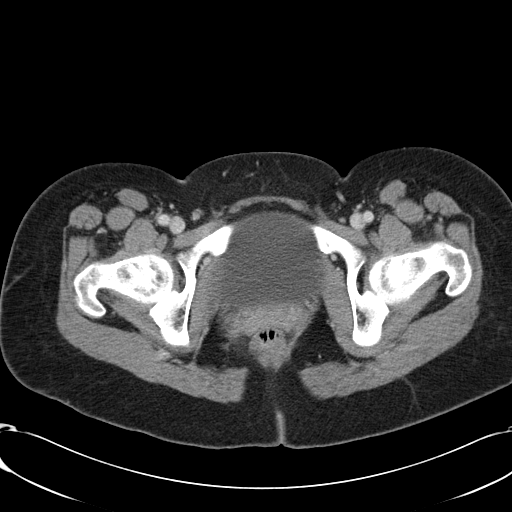
[im 20/96  soft-tissue]
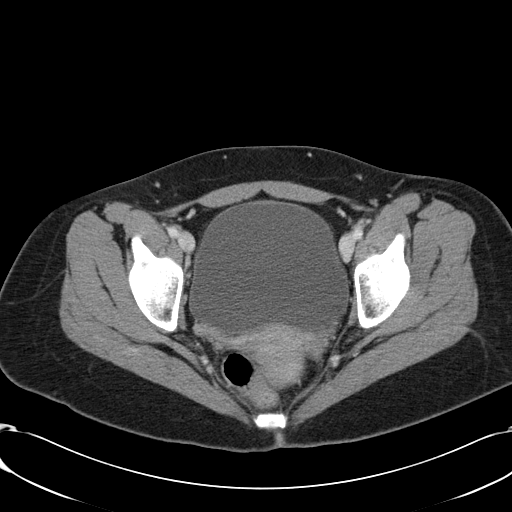
[im 27/96  soft-tissue]
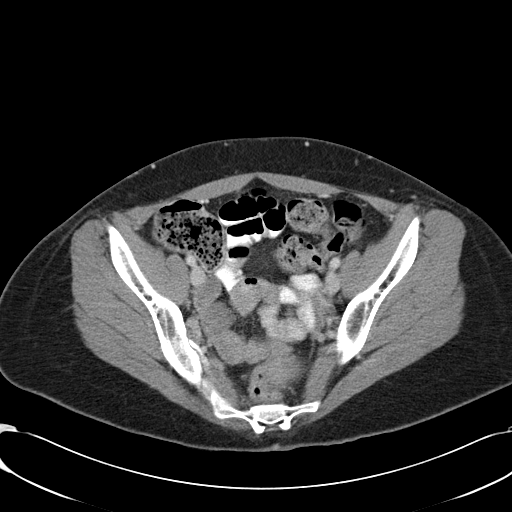
[im 31/96  soft-tissue]
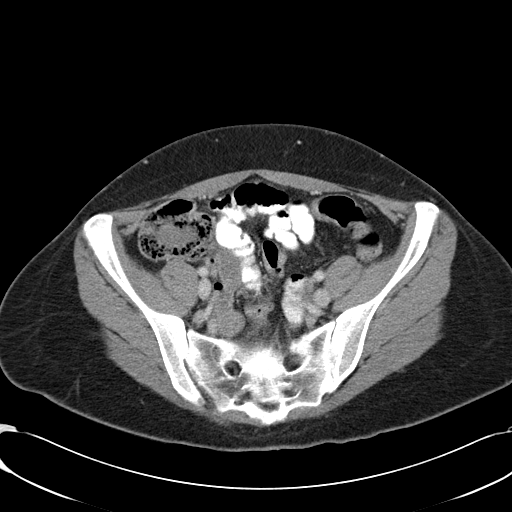
[im 39/96  soft-tissue]
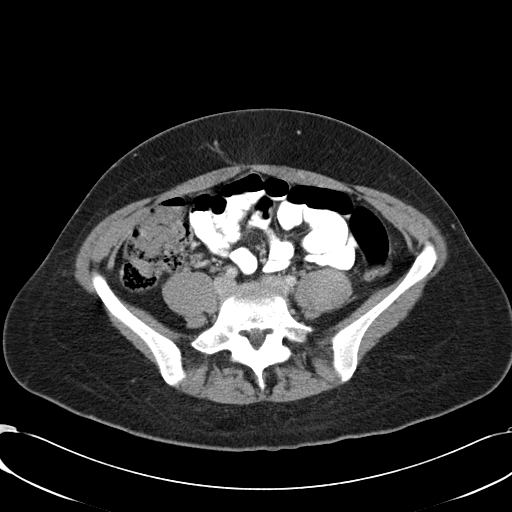
[im 46/96  soft-tissue]
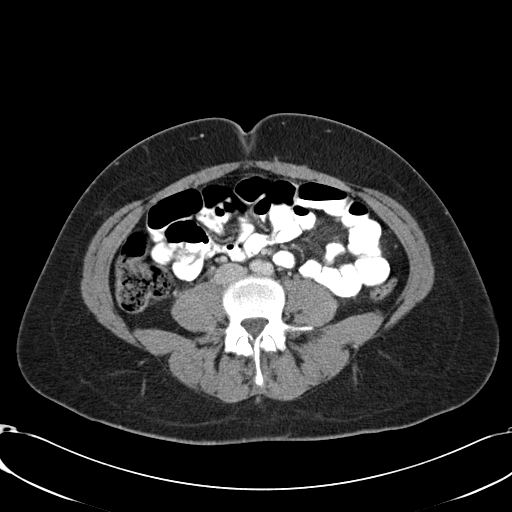
[im 50/96  soft-tissue]
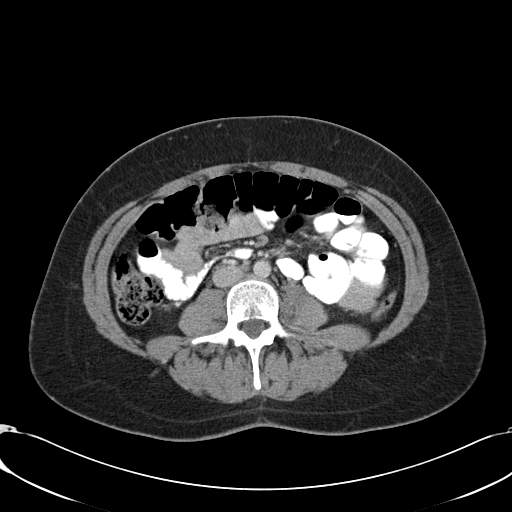
[im 58/96  soft-tissue]
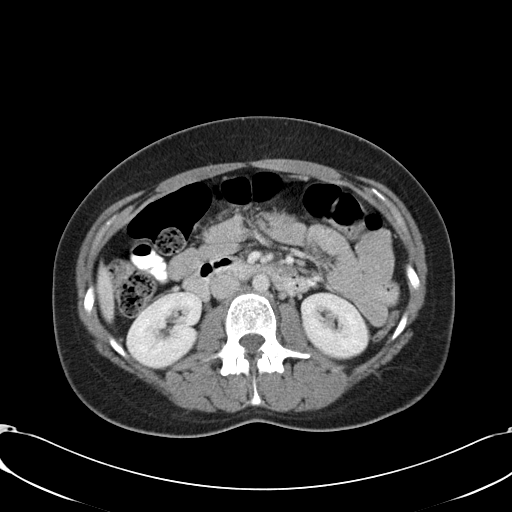
[im 58/96  bone]
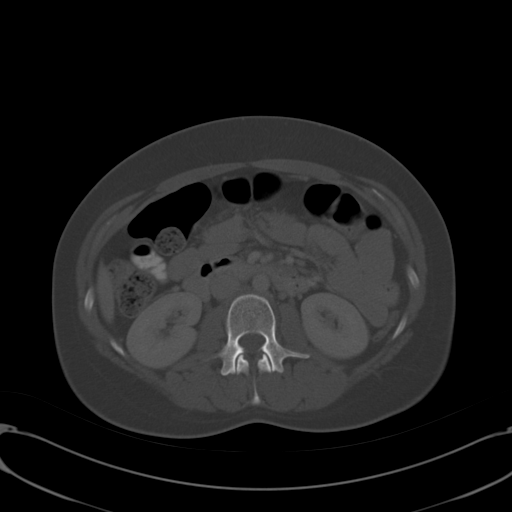
[im 65/96  soft-tissue]
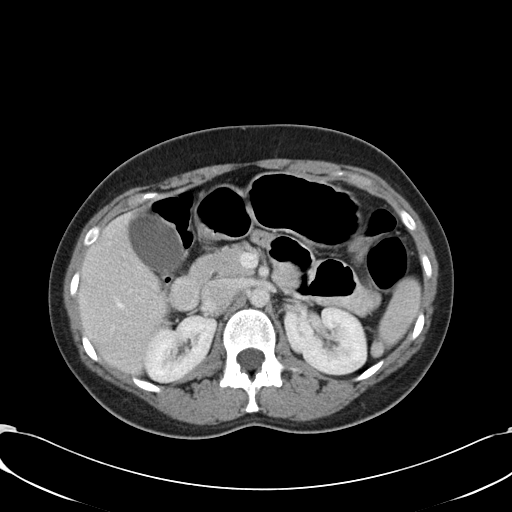
[im 73/96  soft-tissue]
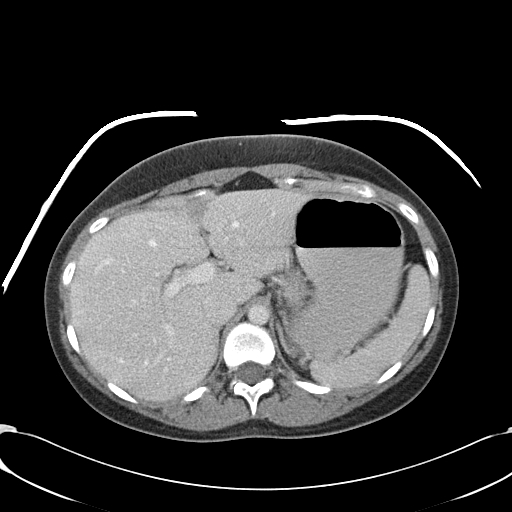
[im 77/96  soft-tissue]
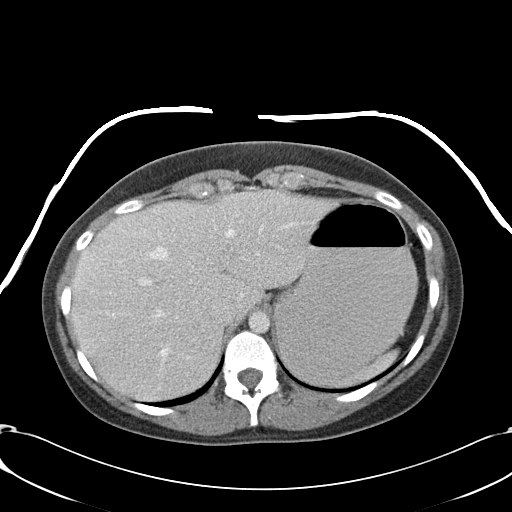
[im 84/96  soft-tissue]
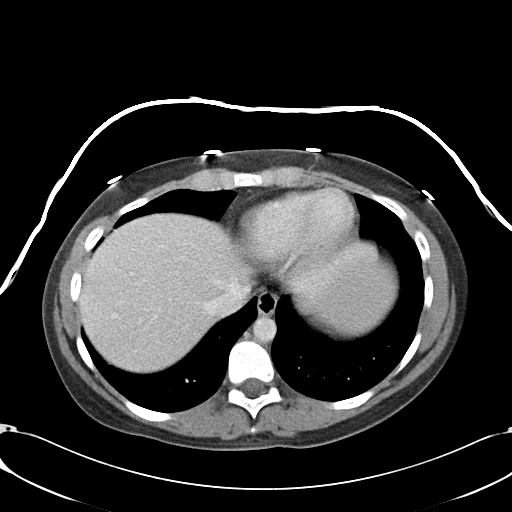
[im 92/96  soft-tissue]
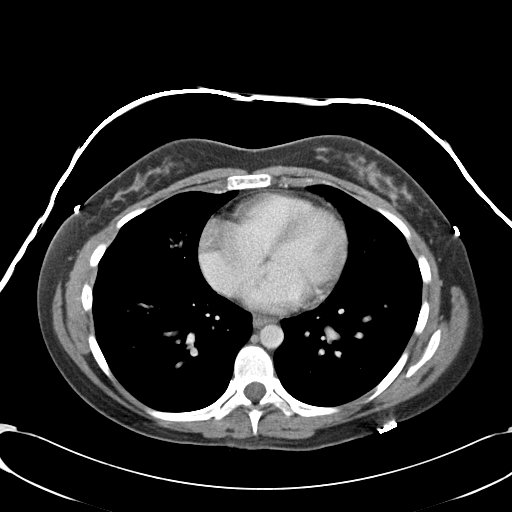

[Series 602: coronal abdomen · coronal · 0.97mm/px · 3 of 108 slices shown]
[im 36/108  soft-tissue]
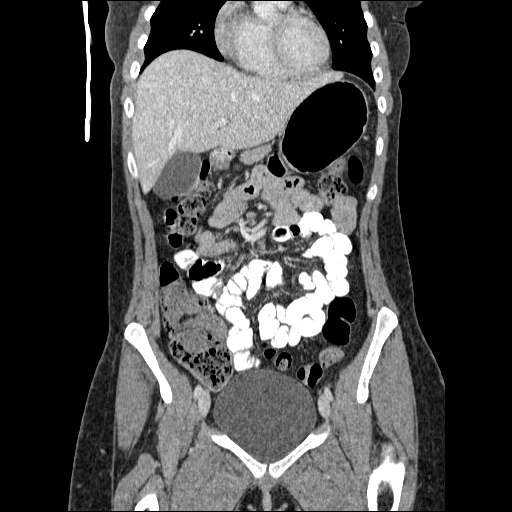
[im 48/108  soft-tissue]
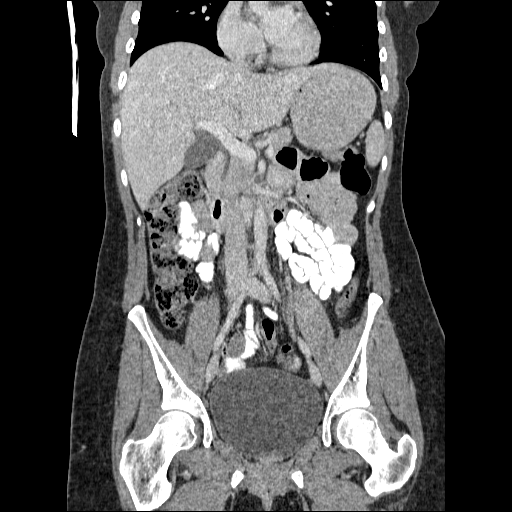
[im 60/108  soft-tissue]
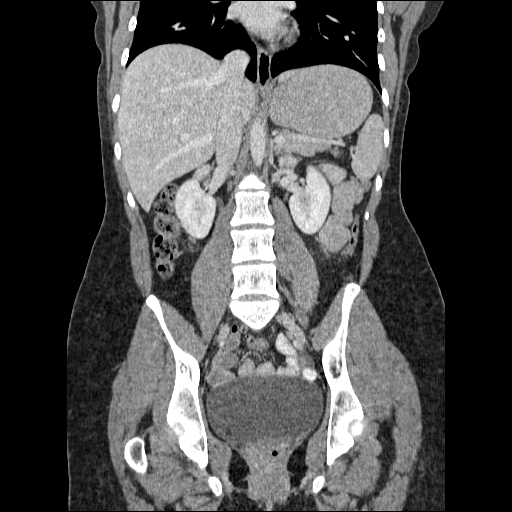

[17 of 46 positions shown; findings below may reference images not displayed]

FINDINGS: The lung bases are clear.  The liver, spleen,
gallbladder, pancreas, bile ducts, aorta, stomach, and small bowel
are unremarkable.  Small accessory spleen.  No adrenal gland
nodules.  The kidneys are symmetrical without solid mass or
hydronephrosis.  Normal homogeneous nephrograms.  No
retroperitoneal lymphadenopathy.  No abdominal ascites.

Pelvis:  Uterus and adnexal structures are not significantly
enlarged.  No pelvic lymphadenopathy.  No pelvic free or loculated
fluid collections.  The appendix is normal.
IMPRESSION: No acute abnormality demonstrated in the abdomen or pelvis.

## 2011-01-02 MED ORDER — IOHEXOL 300 MG/ML  SOLN
125.0000 mL | Freq: Once | INTRAMUSCULAR | Status: AC | PRN
  Administered 2011-01-02: 125 mL via INTRAVENOUS

## 2011-01-10 ENCOUNTER — Ambulatory Visit: Payer: Medicaid Other | Admitting: Women's Health

## 2011-01-14 ENCOUNTER — Ambulatory Visit: Payer: Medicaid Other | Admitting: Women's Health

## 2011-03-04 NOTE — H&P (Signed)
Gabrielle Zavala, Gabrielle Zavala                 ACCOUNT NO.:  [id number]   MEDICAL RECORD NO.:  [id number]          PATIENT TYPE:  MAT   LOCATION:  [facility]                          FACILITY:  [facility]   PHYSICIAN:  Timothy P. Fontaine, M.D.DATE OF BIRTH:  1986-04-19   DATE OF ADMISSION:  08/19/2006  DATE OF DISCHARGE:                                HISTORY & PHYSICAL   CHIEF COMPLAINT:  Immediate postpartum headache, elevated blood pressure.   HISTORY OF PRESENT ILLNESS:  A 25 year old G74, P54 female status post a  vaginal delivery August 14, 2006 who was admitted at 36 weeks being  followed for gestational diabetes with elevated blood pressures in the  150/100 range.  Her PIH labs were obtained and were normal.  She  subsequently underwent induction due to her elevated blood pressure.  Progressed through labor on magnesium sulfate and underwent a spontaneous  vaginal delivery of a female infant.  The patient's postpartum course was  uncomplicated.  She had her magnesium sulfate discontinued postpartum and  her blood pressures at the time of discharge were in the 130/80 range.  The  patient did well until today when her neighbor took her blood pressure.  It  was in the 150/100 range and she subsequently developed a pounding frontal  headache and presents for evaluation.   The patient denies visual changes, swelling, epigastric pain, dizziness or  other symptoms.   PAST MEDICAL HISTORY:  Uncomplicated.   PAST SURGICAL HISTORY:  Uncomplicated.   ALLERGIES:  No medications.   CURRENT MEDICATIONS:  Vitamins.   REVIEW OF SYSTEMS:  Noncontributory.   FAMILY HISTORY:  Noncontributory.   SOCIAL HISTORY:  Noncontributory.   PHYSICAL EXAMINATION:  Blood pressure 150 to 160/100 to 110 range.  Vital  signs otherwise stable, noting pulse of 80.  Respirations normal.  HEENT is  normal.  Funduscopic exam without gross evidence of papilledema.  Lungs are  clear.  Cardiac regular rate.  No murmurs, gallops, or  rubs.  Abdominal exam  is benign.  Fundus difficult to palpate.  Nontender.  Extremities without  edema.  DTRs 1+ without clonus.   ASSESSMENT/PLAN:  A 25 year old G1, P64 female postpartum within the week of  delivery admitted with elevated blood pressures.  Underwent induction and  delivery on magnesium.  Subsequently discontinued.  Discharged to home with  blood pressures in the 130s/80s range.  Her pregnancy-induced hypertension  labs were normal during her hospitalization.  She presents now with  complaints of a headache and elevated blood pressure without other signs or  symptoms of such as visual changes, epigastric pain, peripheral edema.  We  will treat blood pressure now with labetalol 10 mg IV.  Repeat as needed to  decrease diastolic less than 100.  We will place on oral labetalol for  continued management and we will plan on maintaining her on an oral  hypertensive over the next several weeks through her postpartum period.  The  pregnancy-induced hypertension labs were obtained.  If  abnormal, consider magnesium sulfate.  Cath urinalysis was also obtained.  We will treat headache p.r.n. with narcotic  medication.  Plan was discussed  with the patient and her mother who both agree and understand that she will  be hospitalized for blood pressure control, possible magnesium sulfate.      Timothy P. Fontaine, M.D.  Electronically Signed     TPF/MEDQ  D:  08/19/2006  T:  08/19/2006  Job:  [id number]

## 2011-03-04 NOTE — Discharge Summary (Signed)
[name], [name]                 ACCOUNT NO.:  [id number]   MEDICAL RECORD NO.:  [id number]          PATIENT TYPE:  INP   LOCATION:  9308                          FACILITY:  [facility]   PHYSICIAN:  Timothy P. Fontaine, M.D.DATE OF BIRTH:  Jun 13, 1986   DATE OF ADMISSION:  08/19/2006  DATE OF DISCHARGE:  08/20/2006                                 DISCHARGE SUMMARY   DISCHARGE DIAGNOSES:  1. Hypertensive.  2. Immediately postpartum.   PROCEDURE:  None.   HOSPITAL COURSE:  The patient presents having undergone spontaneous vaginal  delivery August 14, 2006.  Pregnancy delivery complicated by elevated blood  pressure treated with magnesium sulfate.  The patient's discharge blood  pressures were in the 130/80 range.  She subsequently went home, had blood  pressures checked and was noted to have a blood pressure of 160/100 up to  180/110.  She presents for evaluation.  At the time of presentation, she was  complaining of a frontal, throbbing headache and serial blood pressures were  elevated as high as being 165/114.  The patient received IV labetalol and  subsequent oral labetalol which decreased her blood pressures and her  headache resolved.  PIH lab values were all obtained.  She had no  proteinuria and her labs were all normal, noting a uric acid of 3.9.  She  did have a mildly elevated SGPT of 54 upper limits were 35.  Every other lab  was normal.  The patient was observed over the evening and her blood  pressures were noted to be in the 130/80, 126/83, 124/82.  Highest during  the evening was 148/96.  At the time of discharge, the patient feels good,  is having no headaches and otherwise is doing well with routine postpartum  issues.  The patient will be discharged home on labetalol 100 mg b.i.d.  She  will have blood pressures checked at home.  I reviewed signs and symptoms of  increased blood pressure, a.s.a.p. call precautions reviewed, 160/100 she  will call.  If in the mid to high  90s, she will monitor initially and then  call if persistent diastolics.  Assuming her blood pressures remained  acceptable, then she will be seen in one week following discharge in the  office.  She has a good support system at home with her mother and the next  door neighbor who is a nurse who was monitoring her blood pressures.  The  patient's questions were answered and she is comfortable with discharge.      Timothy P. Fontaine, M.D.  Electronically Signed     TPF/MEDQ  D:  08/20/2006  T:  08/20/2006  Job:  [id number]

## 2011-03-04 NOTE — H&P (Signed)
Gabrielle Zavala, [name]                 ACCOUNT NO.:  [id number]   MEDICAL RECORD NO.:  [id number]          PATIENT TYPE:  INP   LOCATION:  9156                          FACILITY:  [facility]   PHYSICIAN:  Juan H. Lily Peer, M.D.DATE OF BIRTH:  [birth year]   DATE OF ADMISSION:  08/12/2006  DATE OF DISCHARGE:                                HISTORY & PHYSICAL   CHIEF COMPLAINT:  Contractions.   HISTORY:  The patient is a 25 year old gravida 1, para 0 who is currently 64-  1/[redacted] weeks gestation and had been seen in the office __________  for routine  non-stress test because of her gestational diabetes and was found to be  contracting.  Her cervix is long, closed and posterior.  She had been given  a shot of terbutaline 0.25 subcutaneously and resolved her contractions and  she was sent home on terbutaline 2.5 mg p.o. q. 4 hours.  She returned today  to the emergency room.  It had been 6 hours since she had taken her last  terbutaline.  She was found to be contracting every 3-5 minutes apart.  She  was given her oral dose of 2.5 mg p.o. terbutaline.  She was started on IV  fluids of LR, given a 500 mL bolus and will be followed by 125 mL per hour.  Her cervix was long and closed with no change.  The patient was somewhat  emotional and distraught.  Her blood pressure, initially, was found to be  elevated at 156/100.  Her last blood pressure __________  and PIH labs were  obtained, which were all normal range.  Her urinalysis was without any  evidence of proteinuria.  She denied any visual disturbances, any headache,  occasional right abdominal side wall discomfort but did not appear to be in  any acute distress.   PHYSICAL EXAMINATION:  VITAL SIGNS:  Temperature:  97.8.  Pulse:  83.  Respirations:  20.  Blood pressure:  As described above.  LUNGS:  Clear to auscultation.  No rhonchi or wheezes.  HEART:  Regular rate and rhythm.  No murmurs, rubs or gallops.  ABDOMEN:  Soft, nontender.  No rebound or  guarding.  PELVIC:  Had previously been done by nurse in [facility] and reported the cervix to  be long, closed and posterior, which coincides with my exam from yesterday.  EXTREMITIES:  DTR 1+.  Negative clonus.  No edema.   PRENATAL LABS:  She is A-positive , negative antibodies screen, rubella  immune, hepatitis B surface antigen and HIV were negative.  Normal maternal  alpha-fetoprotein, negative GC and Chlamydia culture.  The patient with  evidence of gestational diabetes based on screening in the office.   ASSESSMENT:  A 25 year old gravida 1, para 0 at 35-1/[redacted] weeks gestation with  premature contractions, anxiety and apprehension initially contributing to  her elevated blood pressure.  Upon relaxing her blood pressure returned back  to normal.  Re-assuring fetal heart rate tracing.  Contractions were found  to be every 3-5 minutes.  Will admit overnight with IV hydration.  Will  continue her p.o.  terbutaline at 2.5 q. 4 hours.  If she breaks through we  will allow her to deliver.  Because of the right abdominal wall discomfort,  an ultrasound was done.  The fetus was in the vertex presentation.  Amniotic  fluid volume was normal and placenta was intact and no evidence of  abruption.  Will keep over night with the IV hydration and p.o. terbutaline  as described above and will continue to monitor overnight  and if she continues to do well we will let her go home tomorrow.  If she  breaks through and delivers at this point at 35-1/[redacted] weeks gestation will go  ahead and let her deliver at this point.  Will also be checking her CBGs 2  hour post-prandial.      Juan H. Lily Peer, M.D.  Electronically Signed     JHF/MEDQ  D:  08/12/2006  T:  08/13/2006  Job:  [id number]

## 2011-05-12 ENCOUNTER — Ambulatory Visit (INDEPENDENT_AMBULATORY_CARE_PROVIDER_SITE_OTHER): Payer: Medicaid Other | Admitting: Women's Health

## 2011-05-12 ENCOUNTER — Other Ambulatory Visit: Payer: Self-pay | Admitting: Women's Health

## 2011-05-12 ENCOUNTER — Encounter: Payer: Self-pay | Admitting: Women's Health

## 2011-05-12 VITALS — BP 140/80 | Ht 66.5 in | Wt 143.0 lb

## 2011-05-12 DIAGNOSIS — N9489 Other specified conditions associated with female genital organs and menstrual cycle: Secondary | ICD-10-CM

## 2011-05-12 DIAGNOSIS — Z113 Encounter for screening for infections with a predominantly sexual mode of transmission: Secondary | ICD-10-CM

## 2011-05-12 DIAGNOSIS — L659 Nonscarring hair loss, unspecified: Secondary | ICD-10-CM

## 2011-05-12 DIAGNOSIS — N9089 Other specified noninflammatory disorders of vulva and perineum: Secondary | ICD-10-CM

## 2011-05-12 DIAGNOSIS — Z01419 Encounter for gynecological examination (general) (routine) without abnormal findings: Secondary | ICD-10-CM

## 2011-05-12 DIAGNOSIS — Z309 Encounter for contraceptive management, unspecified: Secondary | ICD-10-CM

## 2011-05-12 DIAGNOSIS — B373 Candidiasis of vulva and vagina: Secondary | ICD-10-CM

## 2011-05-12 DIAGNOSIS — B3731 Acute candidiasis of vulva and vagina: Secondary | ICD-10-CM

## 2011-05-12 LAB — HEPATITIS B SURFACE ANTIGEN: Hepatitis B Surface Ag: NEGATIVE

## 2011-05-12 LAB — HIV ANTIBODY (ROUTINE TESTING W REFLEX): HIV: NONREACTIVE

## 2011-05-12 LAB — RPR

## 2011-05-12 MED ORDER — FLUCONAZOLE 150 MG PO TABS: 150.0000 mg | ORAL_TABLET | Freq: Once | ORAL | Status: AC

## 2011-05-12 MED ORDER — NYSTATIN-TRIAMCINOLONE 100000-0.1 UNIT/GM-% EX CREA: TOPICAL_CREAM | CUTANEOUS | Status: DC

## 2011-05-12 MED ORDER — NORETHIN-ETH ESTRAD-FE BIPHAS 1 MG-10 MCG / 10 MCG PO TABS: 1.0000 | ORAL_TABLET | Freq: Every day | ORAL | Status: DC

## 2011-05-12 MED ORDER — NORETHIN ACE-ETH ESTRAD-FE 1-20 MG-MCG PO TABS: 1.0000 | ORAL_TABLET | Freq: Every day | ORAL | Status: DC

## 2011-05-12 NOTE — Progress Notes (Signed)
Addended byCammie Mcgee T on: 05/12/2011 12:14 PM   Modules accepted: Orders

## 2011-05-12 NOTE — Progress Notes (Signed)
Gabrielle Zavala Sep 10, 1986 [id number]    History:    The patient presents for annual exam.  25 yo swf G1P1  presents for an annual exam with multiple complaints she states she is just not feeling well today. Having some abdominal cramps. Has had some long-term issues with anxiety and ADD primary care does take care of his medications. She has had some abdominal cramping but she denies diarrhea or constipation. Requested and noted to be written for work absence- one was given to her. She is currently on Juneld 1-20 having a monthly 5-7 day cycle denies any bleeding between her periods.   Past medical history, past surgical history, family history and social history were all reviewed and documented in the EPIC chart.   ROS:  A 14 point ROS was performed and pertinent positives and negatives are included in the history.  Exam:  Filed Vitals:   05/12/11 1029  BP: 140/80    General appearance:  Normal Head/Neck:  Normal, without cervical or supraclavicular adenopathy. Thyroid:  Symmetrical, normal in size, without palpable masses or nodularity. Respiratory  Effort:  Normal  Auscultation:  Clear without wheezing or rhonchi Cardiovascular  Auscultation:  Regular rate, without rubs, murmurs or gallops  Edema/varicosities:  Not grossly evident Abdominal  Masses/tenderness:  Soft,nontender, without masses, guarding or rebound.  Liver/spleen:  No organomegaly noted  Hernia:  None appreciated  Occult test:   Skin  Inspection:  Grossly normal  Palpation:  Grossly normal Neurologic/psychiatric  Orientation:  Normal with appropriate conversation.  Mood/affect:  Normal  Genitourinary     Breasts: Examined lying and sitting.     Right: Without masses, retractions,         discharge or axillary adenopathy.     Left: Without masses, retractions,         discharge or axillary adenopathy.   Inguinal/mons:  Normal without inguinal adenopathy  External genitalia: Erythemic patches, dry , on  both labias  BUS/Urethra/Skene's glands:  Normal  Bladder:  Normal  Vagina:  Normal  Cervix:  Normal  Uterus:  antverted, normal in size, shape and contour.  Midline and mobile  Adnexa/parametria:     Rt: Without masses or tenderness.   Lt: Without masses or tenderness.  Anus and perineum: Normal  Digital rectal exam: Normal sphincter tone without palpated masses or       tenderness.   Assessment/Plan:  25 y.o. year old female for annual exam.   Contracepting on Loloestrin 24 without complaint.  condom use until permanent partner. encouraged exercise, multivitamin daily encouraged. Blood pressure after office visit was 132/80, states has had some increased blood pressure at primary care. Her mother has had long-term history of hypertension. Did review risks of blood clots strokes with birth control pills. Reviewed Implanon Depo-Provera, IUDs, she declines all. States would prefer to stay on birth control pills.  She has had one partner for greater than one year but requested an STD check. Will check a CBC TSH UA Pap GC chlamydia HIV hepatitis and RPR. She complained of hair loss. She does struggle with anxiety and does take an occasional Xanax for that. Did review hazards of addiction.    Harrington Challenger MD, 11:27 AM 05/12/2011

## 2011-05-13 LAB — HEPATITIS C ANTIBODY: HCV Ab: NEGATIVE

## 2011-05-13 NOTE — Progress Notes (Signed)
Addended by: Harrington Challenger on: 05/13/2011 10:18 AM   Modules accepted: Orders

## 2011-05-18 HISTORY — PX: COLPOSCOPY: SHX161

## 2011-05-19 ENCOUNTER — Telehealth: Payer: Self-pay | Admitting: Women's Health

## 2011-05-19 NOTE — Telephone Encounter (Signed)
Telephone call to patient to inform TSH normal and CBC normal.

## 2011-05-19 NOTE — Telephone Encounter (Signed)
Telephone call to patient in regards to her Pap smear. Pap showed ascus with negative high-risk HPV   no endocervical cells were noted on the Pap. 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 Instructed to return to the office to repeat the Pap. Switched to appointments.

## 2011-05-20 NOTE — Progress Notes (Signed)
Addended by: Cammie Mcgee T on: 05/20/2011 08:23 AM   Modules accepted: Orders

## 2011-06-01 ENCOUNTER — Ambulatory Visit: Payer: Medicaid Other | Admitting: Women's Health

## 2011-06-09 ENCOUNTER — Ambulatory Visit (INDEPENDENT_AMBULATORY_CARE_PROVIDER_SITE_OTHER): Payer: Medicaid Other | Admitting: Women's Health

## 2011-06-09 ENCOUNTER — Encounter: Payer: Self-pay | Admitting: Women's Health

## 2011-06-09 ENCOUNTER — Other Ambulatory Visit (HOSPITAL_COMMUNITY)
Admission: RE | Admit: 2011-06-09 | Discharge: 2011-06-09 | Disposition: A | Discharge status: Home / Self Care | Payer: Medicaid Other | Source: Ambulatory Visit | Attending: Gynecology | Admitting: Gynecology

## 2011-06-09 VITALS — BP 130/80

## 2011-06-09 DIAGNOSIS — F419 Anxiety disorder, unspecified: Secondary | ICD-10-CM

## 2011-06-09 DIAGNOSIS — F909 Attention-deficit hyperactivity disorder, unspecified type: Secondary | ICD-10-CM

## 2011-06-09 DIAGNOSIS — R87619 Unspecified abnormal cytological findings in specimens from cervix uteri: Secondary | ICD-10-CM

## 2011-06-09 DIAGNOSIS — O24419 Gestational diabetes mellitus in pregnancy, unspecified control: Secondary | ICD-10-CM

## 2011-06-09 DIAGNOSIS — Z01419 Encounter for gynecological examination (general) (routine) without abnormal findings: Secondary | ICD-10-CM | POA: Insufficient documentation

## 2011-06-09 DIAGNOSIS — O1490 Unspecified pre-eclampsia, unspecified trimester: Secondary | ICD-10-CM

## 2011-06-09 DIAGNOSIS — O99810 Abnormal glucose complicating pregnancy: Secondary | ICD-10-CM

## 2011-06-09 DIAGNOSIS — IMO0002 Reserved for concepts with insufficient information to code with codable children: Secondary | ICD-10-CM

## 2011-06-09 DIAGNOSIS — F411 Generalized anxiety disorder: Secondary | ICD-10-CM

## 2011-06-09 NOTE — Progress Notes (Signed)
  Pesents for repeat Pap.  Pap from 05/12/2011 was ascus with no endocervical cells, negative high-risk HPV. 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 She is without complaints today.  External genitalia is within normal limits, speculum exam cervix is pink without lesion or discharge, repeat Pap was taken and will triage based on those results.

## 2011-06-13 ENCOUNTER — Telehealth: Payer: Self-pay | Admitting: Women's Health

## 2011-06-13 ENCOUNTER — Telehealth: Payer: Self-pay | Admitting: Gynecology

## 2011-06-13 NOTE — Telephone Encounter (Signed)
Pap smear shows low-grade SIL. Patient needs to make a colposcopy appointment

## 2011-06-13 NOTE — Telephone Encounter (Signed)
Telephone call to [facility] regarding her Pap smear. Pap returned LG SIL CIN-1 reviewed will need a C&B with Dr. Audie Box. Switched to appointments to schedule. last 5 years have been normal Paps.

## 2011-06-14 NOTE — Telephone Encounter (Signed)
Pt informed with the below by [place] on 06/13/11, c&b appointment on 06/15/11.

## 2011-06-15 ENCOUNTER — Ambulatory Visit (INDEPENDENT_AMBULATORY_CARE_PROVIDER_SITE_OTHER): Payer: Medicaid Other | Admitting: Gynecology

## 2011-06-15 ENCOUNTER — Encounter: Payer: Self-pay | Admitting: Gynecology

## 2011-06-15 DIAGNOSIS — N870 Mild cervical dysplasia: Secondary | ICD-10-CM

## 2011-06-15 NOTE — Progress Notes (Signed)
Colposcopy note  Patient presents with first abnormal Pap smear in July showing ASCUS negative high risk HPV. There were no endocervical cells. She was asked to come back for repeat Pap smear. Repeat Pap smear showed low-grade SIL and she presents for colposcopy.  Colposcopy was adequate after acetic acid cleanse with an area of white epithelium at 12:00 transformation zone. No other abnormalities were seen. A representative biopsy was taken. Silver nitrate was applied and patient tolerated well.  I reviewed with the patient the issues of dysplasia, high-grade versus low-grade, progression, regression, HPV association. Patient will followup for biopsy results, if low grade or normal then plan repeat Pap smear in 6 months and we'll follow expectantly. If high-grade and I discussed various treatment options and we'll rediscuss. I stressed the need for followup and she understands the issues of progression to cervical cancer.

## 2011-06-17 ENCOUNTER — Telehealth: Payer: Self-pay | Admitting: Gynecology

## 2011-06-17 NOTE — Telephone Encounter (Signed)
Tell patient biopsy consistent with mild atypia as with Pap smear. Recommend repeat Pap smear in 6 months we will follow expectantly at this time.

## 2011-06-17 NOTE — Telephone Encounter (Signed)
Lm for pt to call regarding below note.

## 2011-06-17 NOTE — Telephone Encounter (Signed)
Pt informed with the below note recall letter in computer.

## 2011-07-05 ENCOUNTER — Emergency Department (HOSPITAL_COMMUNITY)
Admission: EM | Admit: 2011-07-05 | Discharge: 2011-07-06 | Disposition: A | Discharge status: Home / Self Care | Payer: Medicaid Other | Attending: Emergency Medicine | Admitting: Emergency Medicine

## 2011-07-05 ENCOUNTER — Emergency Department (HOSPITAL_COMMUNITY): Payer: Medicaid Other

## 2011-07-05 DIAGNOSIS — S93409A Sprain of unspecified ligament of unspecified ankle, initial encounter: Secondary | ICD-10-CM | POA: Insufficient documentation

## 2011-07-05 DIAGNOSIS — X500XXA Overexertion from strenuous movement or load, initial encounter: Secondary | ICD-10-CM | POA: Insufficient documentation

## 2011-07-05 DIAGNOSIS — M25579 Pain in unspecified ankle and joints of unspecified foot: Secondary | ICD-10-CM | POA: Insufficient documentation

## 2011-07-05 DIAGNOSIS — Y9229 Other specified public building as the place of occurrence of the external cause: Secondary | ICD-10-CM | POA: Insufficient documentation
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
## 2011-07-05 IMAGING — CR DG ANKLE COMPLETE 3+V*L*
3 series · 3 of 3 positions shown · non-contrast
Comparison: None.

CLINICAL DATA: 24-year-old female status post fall with pain.

LEFT ANKLE COMPLETE - 3+ VIEW

[t ankle ap left]
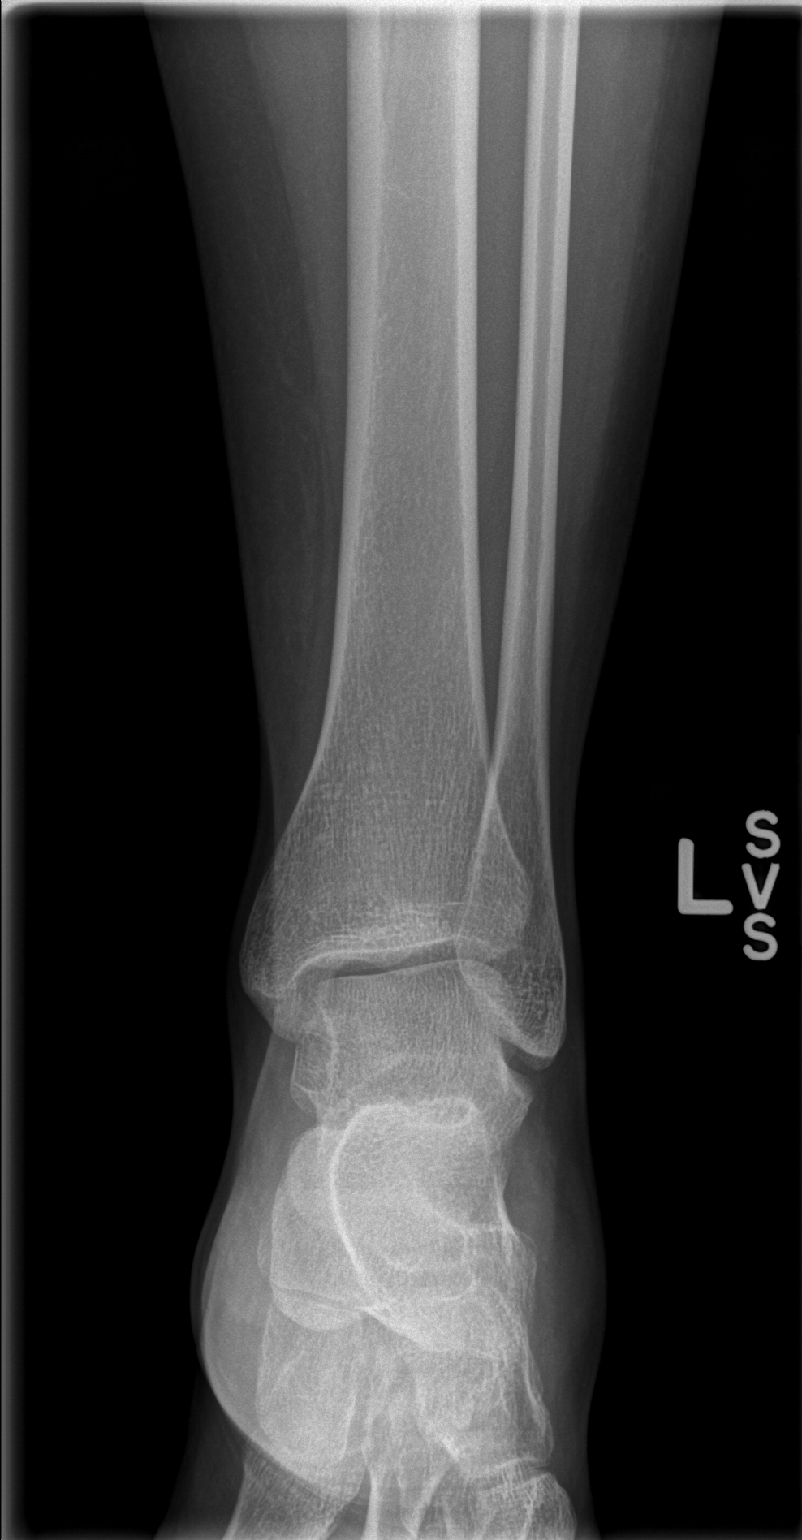

[t ankle obl left]
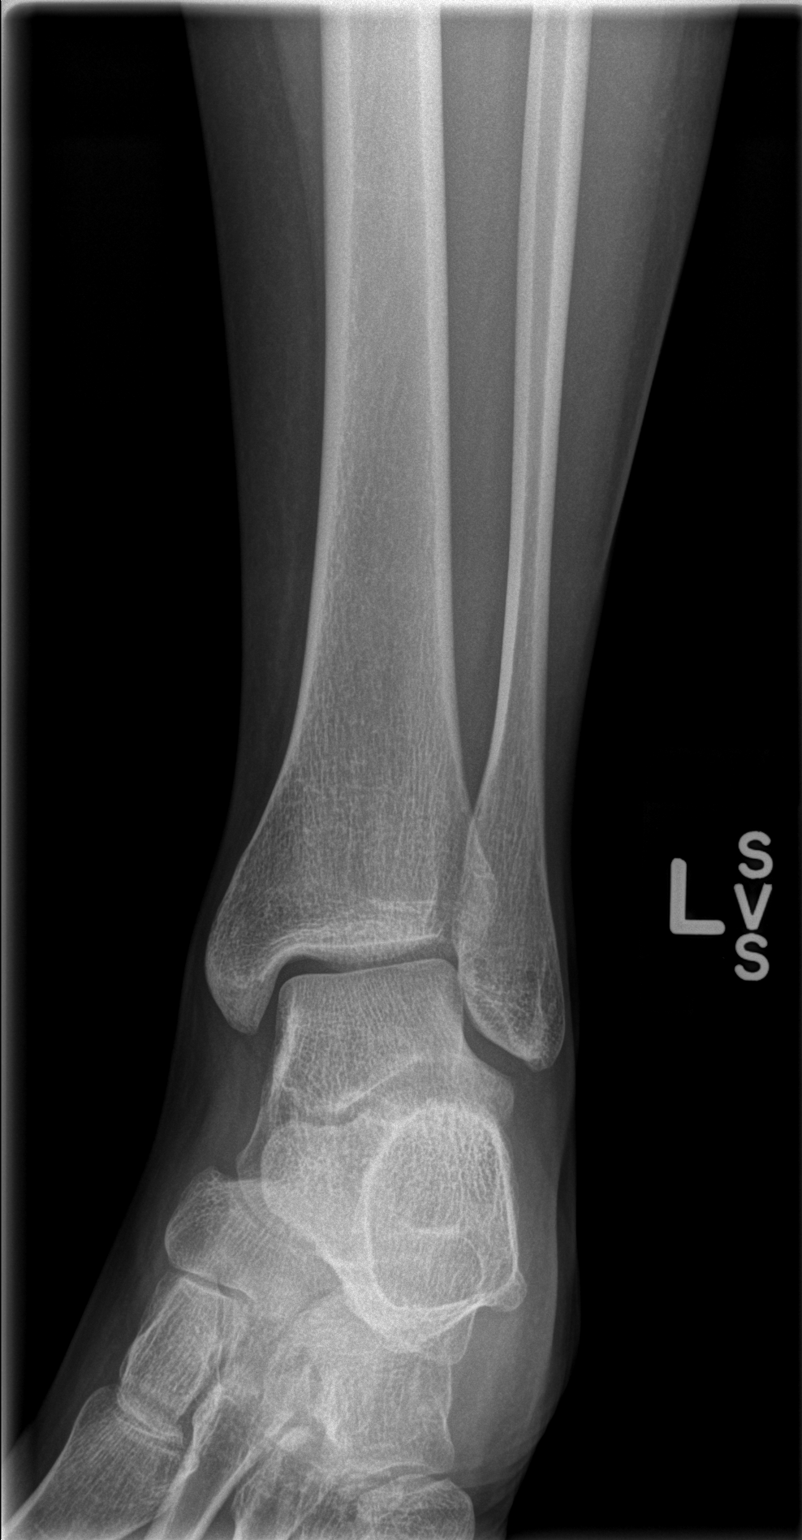

[t ankle lat left]
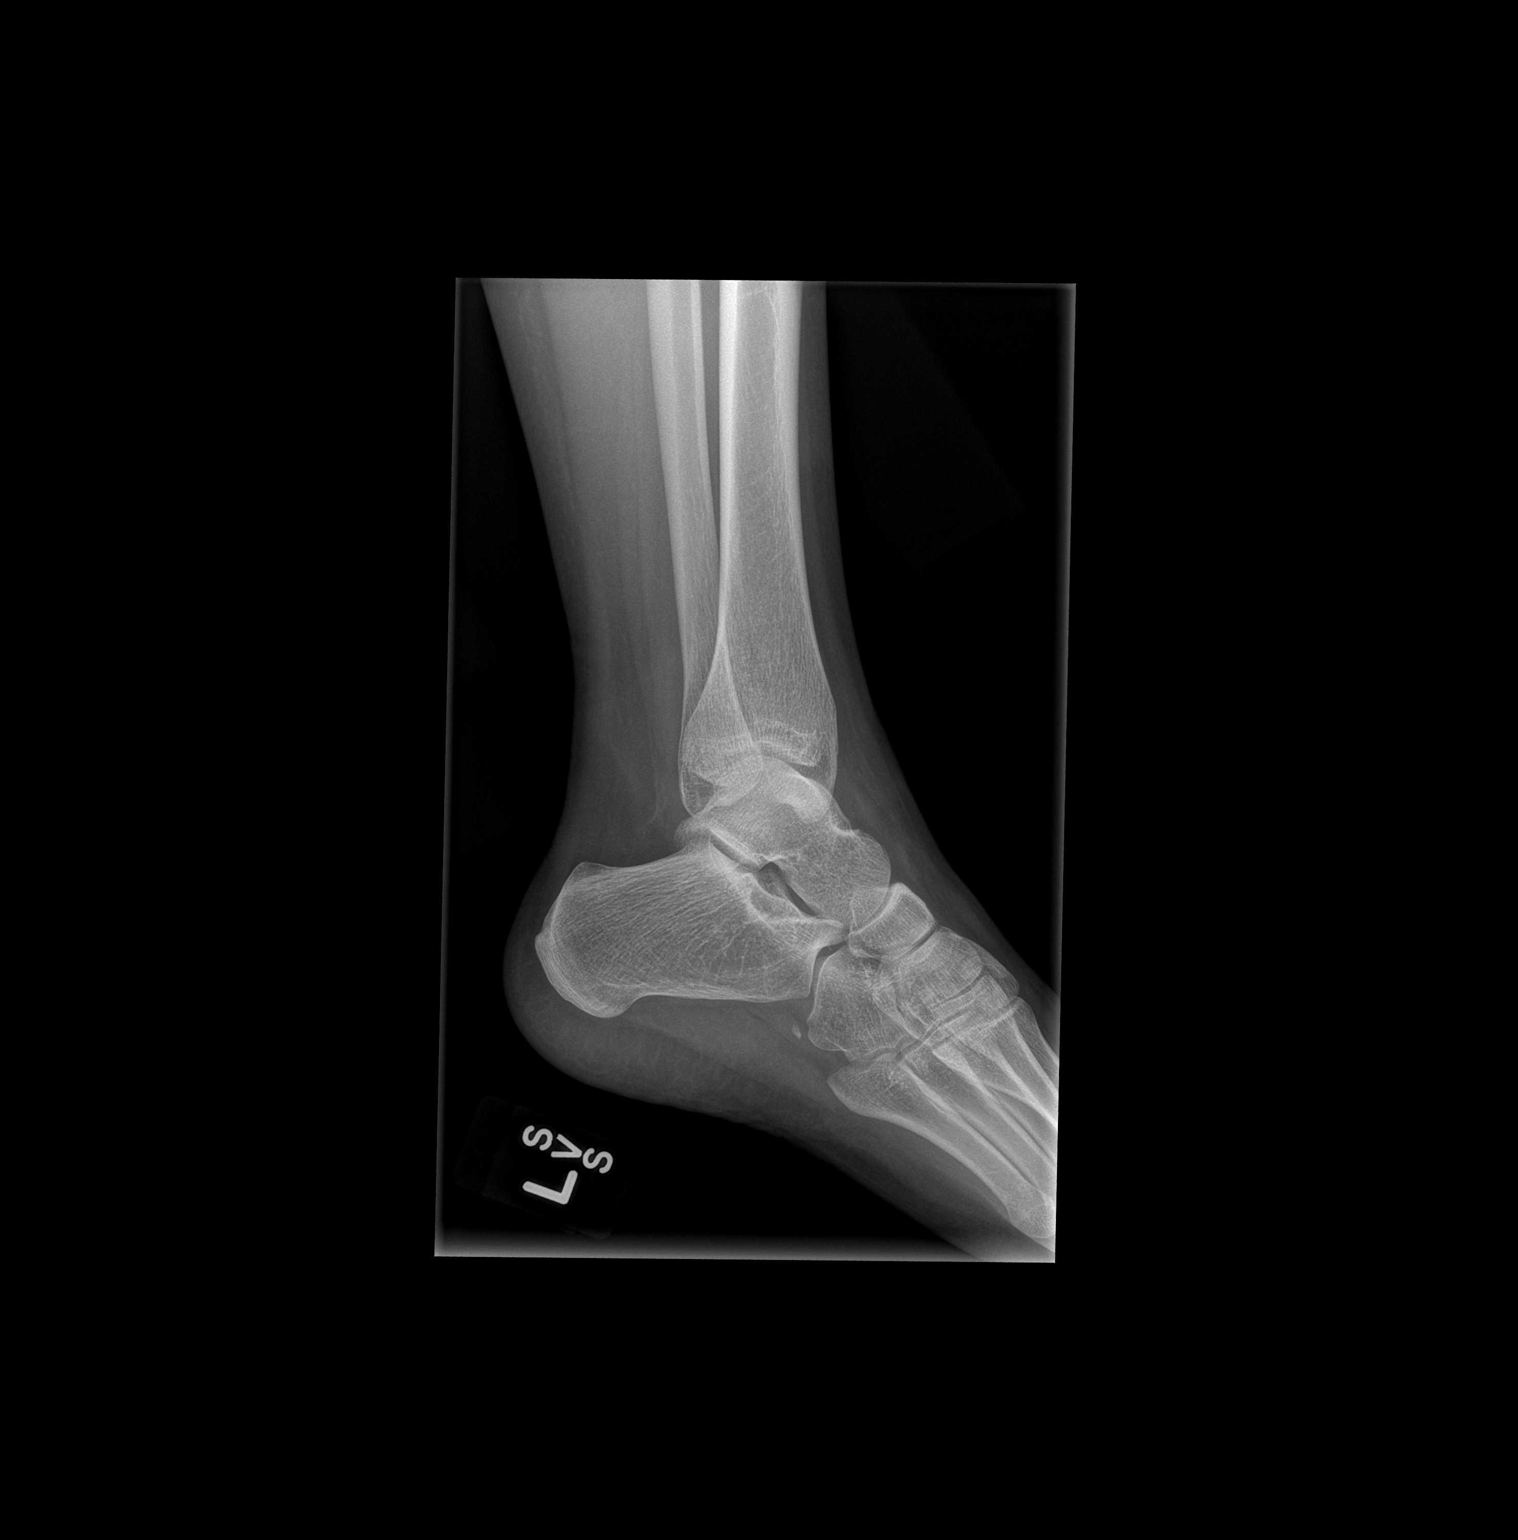

[3 of 3 positions shown; findings below may reference images not displayed]

FINDINGS: No joint effusion identified.  Calcaneus intact.  Mortise
joint alignment preserved.  Talar dome intact.  No acute fracture
identified.  Small accessory ossicle adjacent to the cuboid.
IMPRESSION: No acute fracture or dislocation identified about the left ankle.

## 2011-08-24 ENCOUNTER — Telehealth: Payer: Self-pay | Admitting: *Deleted

## 2011-08-24 NOTE — Telephone Encounter (Signed)
Lm on patient's voice mail for patient to call to schedule appointment with [place].  Was c/o bleeding off and on for a month now.

## 2011-08-31 NOTE — Telephone Encounter (Signed)
Patient never called back to schedule appointment to be evaluated. Filed chart back.

## 2011-11-30 ENCOUNTER — Encounter: Payer: Self-pay | Admitting: Internal Medicine

## 2011-11-30 ENCOUNTER — Ambulatory Visit: Payer: Self-pay | Admitting: Internal Medicine

## 2011-11-30 VITALS — BP 142/85 | HR 93 | Temp 97.9°F | Resp 20 | Ht 64.5 in | Wt 173.6 lb

## 2011-11-30 DIAGNOSIS — Z6829 Body mass index (BMI) 29.0-29.9, adult: Secondary | ICD-10-CM

## 2011-11-30 DIAGNOSIS — F988 Other specified behavioral and emotional disorders with onset usually occurring in childhood and adolescence: Secondary | ICD-10-CM

## 2011-11-30 DIAGNOSIS — F419 Anxiety disorder, unspecified: Secondary | ICD-10-CM

## 2011-11-30 DIAGNOSIS — F909 Attention-deficit hyperactivity disorder, unspecified type: Secondary | ICD-10-CM

## 2011-11-30 MED ORDER — AMPHETAMINE-DEXTROAMPHET ER 30 MG PO CP24: ORAL_CAPSULE | ORAL | Status: DC

## 2011-11-30 MED ORDER — AMPHETAMINE-DEXTROAMPHETAMINE 30 MG PO TABS: ORAL_TABLET | ORAL | Status: DC

## 2011-11-30 MED ORDER — ALPRAZOLAM 1 MG PO TABS: 1.0000 mg | ORAL_TABLET | Freq: Two times a day (BID) | ORAL | Status: DC

## 2011-11-30 NOTE — Progress Notes (Signed)
  Subjective:    Patient ID: Gabrielle Zavala, female    DOB: 09/30/1986, 26 y.o.   MRN: [id number]  HPIf/u add and anxiety Doing well.back in school. Anatomy,physiology.etc 26yo in pre-k anx contr by prn xanax-never more than 2 a day--no insom Adder30am..occasionally 1/2 pm    Review of Systemsnote wt gain     Objective:   Physical Examcvr and neuro wnl        Assessment & Plan:  ADD Anxiety Overweight   meds refilled Diet + exercise F/u 6 months

## 2012-01-03 ENCOUNTER — Ambulatory Visit (INDEPENDENT_AMBULATORY_CARE_PROVIDER_SITE_OTHER): Payer: Medicaid Other | Admitting: Gynecology

## 2012-01-03 ENCOUNTER — Encounter: Payer: Self-pay | Admitting: Gynecology

## 2012-01-03 ENCOUNTER — Other Ambulatory Visit (HOSPITAL_COMMUNITY)
Admission: RE | Admit: 2012-01-03 | Discharge: 2012-01-03 | Disposition: A | Discharge status: Home / Self Care | Payer: Medicaid Other | Source: Ambulatory Visit | Attending: Gynecology | Admitting: Gynecology

## 2012-01-03 DIAGNOSIS — Z01419 Encounter for gynecological examination (general) (routine) without abnormal findings: Secondary | ICD-10-CM | POA: Insufficient documentation

## 2012-01-03 DIAGNOSIS — M545 Low back pain: Secondary | ICD-10-CM

## 2012-01-03 DIAGNOSIS — N870 Mild cervical dysplasia: Secondary | ICD-10-CM

## 2012-01-03 DIAGNOSIS — N912 Amenorrhea, unspecified: Secondary | ICD-10-CM

## 2012-01-03 DIAGNOSIS — N898 Other specified noninflammatory disorders of vagina: Secondary | ICD-10-CM

## 2012-01-03 LAB — WET PREP FOR TRICH, YEAST, CLUE

## 2012-01-03 LAB — TSH: TSH: 2.68 u[IU]/mL (ref 0.350–4.500)

## 2012-01-03 MED ORDER — FLUCONAZOLE 150 MG PO TABS: 150.0000 mg | ORAL_TABLET | Freq: Once | ORAL | Status: AC

## 2012-01-03 NOTE — Progress Notes (Signed)
Patient presents with several issues as noted below.  Exam with Sherrilyn Rist chaperone present Spine straight no CVA tenderness Abdomen soft nontender without masses guarding rebound organomegaly Pelvic external BUS vagina with midline 12:00 hymenal tag extending down to the posterior fourchette. Thick white discharge.  Cervix normal, Pap done. Uterus retroverted grossly normal in size midline mobile nontender. Adnexa without masses or tenderness. Lower extremities without gross neurologic findings.  Assessment and plan: 1. Low-grade SIL. Patient had ASCUS, negative high risk HPV Pap smear July 2012. Follow up Pap smear August 2012 showed low-grade SIL. Colposcopy and biopsy showed low-grade SIL. She's here for her first follow up Pap smear. Pap was done, patient will call for results.  If normal or low-grade plan repeat in July when she is due for her annual. 2. Amenorrhea. Patient could not afford birth control pills for several months and stopped them and did not have any menses. She restarted them 2 days ago after checking a urine pregnancy test at home. She does relate some history of irregular menses before initiating birth control pills years ago. Exam today is normal. We'll check baseline TSH, FSH, I prolactin and qualitative hCG. Assuming negative then she'll continue on her oral contraceptives and we'll see how she does. If amenorrhea or irregular cycles continue she will represent. 3. Low back pain. Patient has a one-month history of low back pain. Does not remember any injury, straining or precipitating event. She has no sciatica-type symptoms with radiation down the leg or neurologic distal defects.  Will check pelvic ultrasound rule out pelvic process as well as due to her amenorrhea. Assuming negative then we'll treat as muscle spasm initially. She did make an appointment to see her primary but cannot get in to see them for one month per her history. 4. 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 Approximately 2 cm  in length. Patient states it does not bother her and we'll continue just to observe as this has been present for years. It does bother her I told her that we could easily remove this in the office. 5. Vaginal discharge. Heavy white discharge noted on exam. Patient does relate noting this historically. Wet prep is unremarkable but clinically is very suspicious for yeast. We'll treat her with Diflucan 150x1 dose follow up if symptoms persist or recur.

## 2012-01-03 NOTE — Patient Instructions (Signed)
Follow up for lab work and ultrasound as scheduled 

## 2012-01-04 LAB — URINALYSIS W MICROSCOPIC + REFLEX CULTURE
Casts: NONE SEEN
Hgb urine dipstick: NEGATIVE
Leukocytes, UA: NEGATIVE
Nitrite: NEGATIVE
Specific Gravity, Urine: 1.013 (ref 1.005–1.030)
pH: 7.5 (ref 5.0–8.0)

## 2012-01-05 ENCOUNTER — Ambulatory Visit (INDEPENDENT_AMBULATORY_CARE_PROVIDER_SITE_OTHER): Payer: Medicaid Other

## 2012-01-05 ENCOUNTER — Ambulatory Visit (INDEPENDENT_AMBULATORY_CARE_PROVIDER_SITE_OTHER): Payer: Medicaid Other | Admitting: Gynecology

## 2012-01-05 ENCOUNTER — Encounter: Payer: Self-pay | Admitting: Gynecology

## 2012-01-05 DIAGNOSIS — N8300 Follicular cyst of ovary, unspecified side: Secondary | ICD-10-CM

## 2012-01-05 DIAGNOSIS — M545 Low back pain, unspecified: Secondary | ICD-10-CM

## 2012-01-05 DIAGNOSIS — N912 Amenorrhea, unspecified: Secondary | ICD-10-CM

## 2012-01-05 DIAGNOSIS — N854 Malposition of uterus: Secondary | ICD-10-CM

## 2012-01-05 MED ORDER — IBUPROFEN 800 MG PO TABS: 800.0000 mg | ORAL_TABLET | Freq: Three times a day (TID) | ORAL | Status: AC | PRN

## 2012-01-05 MED ORDER — CYCLOBENZAPRINE HCL 10 MG PO TABS: 10.0000 mg | ORAL_TABLET | Freq: Three times a day (TID) | ORAL | Status: DC | PRN

## 2012-01-05 NOTE — Patient Instructions (Signed)
Follow up in 6 months for your annual exam and repeat Pap smear.

## 2012-01-05 NOTE — Progress Notes (Signed)
Patient also for ultrasound due to her low back pain and amenorrhea. Ultrasound today shows endometrial echo is 4.1 mm. The uterus has normal echotexture, right and left ovaries visualized with physiologic changes, cul-de-sac is negative.  Reviewed this with the patient. Her FSH, prolactin, TSH and hCG were also negative. She is on her oral contraceptives and will continue. Her Pap smear was normal. She continues with some low back pain ahead and treat her with ibuprofen 800 mg #30 one by mouth every 8 hours and Flexeril 10 mg #30 one by mouth at bedtime up to 3 times daily as needed. If her back pain continues she knows to follow up with her primary for further evaluation. Otherwise she will see me in 6 months at her annual with repeat Pap smear

## 2012-01-27 ENCOUNTER — Encounter: Payer: Self-pay | Admitting: Women's Health

## 2012-02-06 ENCOUNTER — Telehealth: Payer: Self-pay | Admitting: Internal Medicine

## 2012-02-06 DIAGNOSIS — F909 Attention-deficit hyperactivity disorder, unspecified type: Secondary | ICD-10-CM

## 2012-02-06 MED ORDER — AMPHETAMINE-DEXTROAMPHETAMINE 30 MG PO TABS: ORAL_TABLET | ORAL | Status: DC

## 2012-02-06 NOTE — Telephone Encounter (Signed)
Pt brought in script for Adderall XR and this was switched out to Adderall 30 mg #45 one in am 1/2 in pm as needed.  Pt given new prescription.

## 2012-03-05 ENCOUNTER — Telehealth: Payer: Self-pay

## 2012-03-05 DIAGNOSIS — F909 Attention-deficit hyperactivity disorder, unspecified type: Secondary | ICD-10-CM

## 2012-03-05 MED ORDER — AMPHETAMINE-DEXTROAMPHETAMINE 30 MG PO TABS: ORAL_TABLET | ORAL | Status: DC

## 2012-03-05 NOTE — Telephone Encounter (Signed)
PT IN NEED OF HER ADDERALL. PLEASE CALL [phone]

## 2012-03-05 NOTE — Telephone Encounter (Signed)
Will review paper chart.

## 2012-03-05 NOTE — Telephone Encounter (Signed)
Notified pt Rx is ready. 

## 2012-03-05 NOTE — Telephone Encounter (Signed)
rx printed.  Please advise patient that she was due for OV 12/2011.  She'll need to see Dr. Merla Riches for next fill.

## 2012-03-17 DIAGNOSIS — Z0271 Encounter for disability determination: Secondary | ICD-10-CM

## 2012-04-02 ENCOUNTER — Telehealth: Payer: Self-pay

## 2012-04-02 MED ORDER — AMPHETAMINE-DEXTROAMPHETAMINE 30 MG PO TABS: ORAL_TABLET | ORAL | Status: DC

## 2012-04-02 NOTE — Telephone Encounter (Signed)
Rx filled and ready for pickup. May fill on/after 04/04/12

## 2012-04-02 NOTE — Telephone Encounter (Signed)
PT STATES SHE RAN OUT OF HER ADDERALL AND IN NEED OF A REFILL PLEASE CALL [phone] WHEN READY FOR PICK UP

## 2012-04-02 NOTE — Telephone Encounter (Signed)
Pt has appt on 05/09/2012

## 2012-04-03 NOTE — Telephone Encounter (Signed)
Spoke with pt advised  Rx ready to pick up. 

## 2012-05-01 ENCOUNTER — Encounter (HOSPITAL_COMMUNITY): Payer: Self-pay | Admitting: Emergency Medicine

## 2012-05-01 ENCOUNTER — Emergency Department (HOSPITAL_COMMUNITY)
Admission: EM | Admit: 2012-05-01 | Discharge: 2012-05-01 | Disposition: A | Discharge status: Home / Self Care | Payer: No Typology Code available for payment source | Attending: Emergency Medicine | Admitting: Emergency Medicine

## 2012-05-01 DIAGNOSIS — F909 Attention-deficit hyperactivity disorder, unspecified type: Secondary | ICD-10-CM | POA: Insufficient documentation

## 2012-05-01 DIAGNOSIS — M549 Dorsalgia, unspecified: Secondary | ICD-10-CM | POA: Insufficient documentation

## 2012-05-01 DIAGNOSIS — F411 Generalized anxiety disorder: Secondary | ICD-10-CM | POA: Insufficient documentation

## 2012-05-01 DIAGNOSIS — Z803 Family history of malignant neoplasm of breast: Secondary | ICD-10-CM | POA: Insufficient documentation

## 2012-05-01 DIAGNOSIS — Y92410 Unspecified street and highway as the place of occurrence of the external cause: Secondary | ICD-10-CM | POA: Insufficient documentation

## 2012-05-01 DIAGNOSIS — Z79899 Other long term (current) drug therapy: Secondary | ICD-10-CM | POA: Insufficient documentation

## 2012-05-01 MED ORDER — CYCLOBENZAPRINE HCL 10 MG PO TABS
5.0000 mg | ORAL_TABLET | Freq: Once | ORAL | Status: AC
  Administered 2012-05-01: 5 mg via ORAL
  Filled 2012-05-01: qty 1

## 2012-05-01 MED ORDER — OXYCODONE-ACETAMINOPHEN 5-325 MG PO TABS
1.0000 | ORAL_TABLET | Freq: Once | ORAL | Status: AC
  Administered 2012-05-01: 1 via ORAL
  Filled 2012-05-01: qty 1

## 2012-05-01 MED ORDER — OXYCODONE-ACETAMINOPHEN 5-325 MG PO TABS: 1.0000 | ORAL_TABLET | Freq: Four times a day (QID) | ORAL | Status: AC | PRN

## 2012-05-01 MED ORDER — CYCLOBENZAPRINE HCL 10 MG PO TABS: 10.0000 mg | ORAL_TABLET | Freq: Two times a day (BID) | ORAL | Status: AC | PRN
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
## 2012-05-01 NOTE — ED Notes (Signed)
Pt states she has pain in back of neck. Pt denies pain upon palpation of neck.
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
## 2012-05-02 NOTE — ED Provider Notes (Signed)
Medical screening examination/treatment/procedure(s) were performed by non-physician practitioner and as supervising physician I was immediately available for consultation/collaboration.   Gwyneth Sprout, MD 05/02/12 2300

## 2012-05-04 ENCOUNTER — Telehealth: Payer: Self-pay

## 2012-05-04 NOTE — Telephone Encounter (Signed)
PT STATES IN NEED OF HER ADDERALL. PLEASE CALL [phone] AND SHE HAVE AN APPT WITH DR Sherin Quarry

## 2012-05-05 NOTE — Telephone Encounter (Signed)
PATIENT STATES SHE CALLED Friday TO GET A REFILL ON HER ADDERALL 30MG . SHE HAS AN APPOINTMENT WITH DR. Merla Riches ON July 24TH, BUT SHE IS COMPLETELY OUT. SHE IS CALLING BACK TO CHECK THE STATUS OF HER REQUEST PLEASE. BEST PHONE [phone]  MBC

## 2012-05-05 NOTE — Telephone Encounter (Signed)
Looks like ok for refill, needs OV for more

## 2012-05-06 MED ORDER — AMPHETAMINE-DEXTROAMPHETAMINE 30 MG PO TABS: 15.0000 mg | ORAL_TABLET | Freq: Two times a day (BID) | ORAL | Status: DC

## 2012-05-06 NOTE — Telephone Encounter (Signed)
Rx in pickup drawer and patient notified. 

## 2012-05-06 NOTE — Telephone Encounter (Signed)
RX done at TL desk 

## 2012-05-09 ENCOUNTER — Encounter: Payer: Self-pay | Admitting: Internal Medicine

## 2012-05-09 ENCOUNTER — Ambulatory Visit: Payer: Self-pay | Admitting: Internal Medicine

## 2012-05-09 VITALS — BP 146/96 | HR 83 | Temp 98.5°F | Resp 16 | Ht 65.0 in | Wt 163.0 lb

## 2012-05-09 DIAGNOSIS — S335XXA Sprain of ligaments of lumbar spine, initial encounter: Secondary | ICD-10-CM

## 2012-05-09 DIAGNOSIS — M542 Cervicalgia: Secondary | ICD-10-CM

## 2012-05-09 DIAGNOSIS — M545 Low back pain, unspecified: Secondary | ICD-10-CM

## 2012-05-09 DIAGNOSIS — F419 Anxiety disorder, unspecified: Secondary | ICD-10-CM

## 2012-05-09 DIAGNOSIS — S161XXA Strain of muscle, fascia and tendon at neck level, initial encounter: Secondary | ICD-10-CM

## 2012-05-09 DIAGNOSIS — S39012A Strain of muscle, fascia and tendon of lower back, initial encounter: Secondary | ICD-10-CM

## 2012-05-09 DIAGNOSIS — F988 Other specified behavioral and emotional disorders with onset usually occurring in childhood and adolescence: Secondary | ICD-10-CM

## 2012-05-09 MED ORDER — MELOXICAM 15 MG PO TABS: 15.0000 mg | ORAL_TABLET | Freq: Every day | ORAL | Status: DC

## 2012-05-09 MED ORDER — AMPHETAMINE-DEXTROAMPHETAMINE 30 MG PO TABS: 15.0000 mg | ORAL_TABLET | Freq: Two times a day (BID) | ORAL | Status: DC

## 2012-05-09 MED ORDER — HYDROCODONE-ACETAMINOPHEN 7.5-325 MG PO TABS: 1.0000 | ORAL_TABLET | Freq: Four times a day (QID) | ORAL | Status: AC | PRN

## 2012-05-09 MED ORDER — ALPRAZOLAM 1 MG PO TABS: 1.0000 mg | ORAL_TABLET | Freq: Two times a day (BID) | ORAL | Status: DC

## 2012-05-09 NOTE — Patient Instructions (Addendum)
Take Mobic daily Take Hydrocodone every 6 hrs but only as needed for pain Use Flexeril at bedtime Continue Chiropractic treatments Recheck in 3 weeks if not well Medications for your chronic problems filled today also/may call in one month for refill for 2 more months of ADD medication
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
## 2012-05-17 ENCOUNTER — Telehealth: Payer: Self-pay | Admitting: *Deleted

## 2012-05-17 NOTE — Telephone Encounter (Signed)
Please call and instruct to keep her annual exam appointment and will discuss irregular cycle at that time.

## 2012-05-17 NOTE — Telephone Encounter (Signed)
Pt informed with the below note. 

## 2012-05-17 NOTE — Telephone Encounter (Signed)
Pt calling c/o vaginal bleeding x 1 month then stopped for 4 days and started back again, mild flow. Currently taking lo Loestrin pills takes daily on time. Pt said her cycles have been irregular lately. Pt has OV 05/24/12 annual. Call back # if needed [phone]

## 2012-05-21 ENCOUNTER — Other Ambulatory Visit: Payer: Self-pay | Admitting: Women's Health

## 2012-05-24 ENCOUNTER — Ambulatory Visit (INDEPENDENT_AMBULATORY_CARE_PROVIDER_SITE_OTHER): Payer: Medicaid Other | Admitting: Women's Health

## 2012-05-24 ENCOUNTER — Encounter: Payer: Self-pay | Admitting: Women's Health

## 2012-05-24 VITALS — BP 126/70 | Ht 65.5 in | Wt 166.0 lb

## 2012-05-24 DIAGNOSIS — Z309 Encounter for contraceptive management, unspecified: Secondary | ICD-10-CM

## 2012-05-24 DIAGNOSIS — Z113 Encounter for screening for infections with a predominantly sexual mode of transmission: Secondary | ICD-10-CM

## 2012-05-24 DIAGNOSIS — IMO0001 Reserved for inherently not codable concepts without codable children: Secondary | ICD-10-CM

## 2012-05-24 DIAGNOSIS — Z01419 Encounter for gynecological examination (general) (routine) without abnormal findings: Secondary | ICD-10-CM

## 2012-05-24 LAB — CBC WITH DIFFERENTIAL/PLATELET
Eosinophils Relative: 2 % (ref 0–5)
HCT: 39.1 % (ref 36.0–46.0)
Hemoglobin: 13 g/dL (ref 12.0–15.0)
Lymphocytes Relative: 27 % (ref 12–46)
Lymphs Abs: 2.3 10*3/uL (ref 0.7–4.0)
MCV: 90.5 fL (ref 78.0–100.0)
Monocytes Absolute: 0.6 10*3/uL (ref 0.1–1.0)
Monocytes Relative: 7 % (ref 3–12)
Neutro Abs: 5.4 10*3/uL (ref 1.7–7.7)
RDW: 13.1 % (ref 11.5–15.5)
WBC: 8.5 10*3/uL (ref 4.0–10.5)

## 2012-05-24 LAB — RPR

## 2012-05-24 MED ORDER — NORGESTIMATE-ETH ESTRADIOL 0.25-35 MG-MCG PO TABS: 1.0000 | ORAL_TABLET | Freq: Every day | ORAL | Status: DC

## 2012-05-24 NOTE — Patient Instructions (Addendum)

## 2012-05-24 NOTE — Progress Notes (Signed)
Gabrielle Zavala 1985-12-18 [id number]    History:    The patient presents for annual exam.  Contraceptives on Lo Loestrin 24 FE having problems with spotting throughout cycle for the last 2-3 months prior to that regular monthly cycle. 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 Did not receive gardasil. Had a normal TSH, prolactin, FSH and ultrasound 3/13. ADD on Adderall per primary care.  Past medical history, past surgical history, family history and social history were all reviewed and documented in the EPIC chart. Son Whitney Post age 54 doing well. 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  History of GDM an elevated blood pressures with pregnancy.   ROS:  A  ROS was performed and pertinent positives and negatives are included in the history.  Exam:  Filed Vitals:   05/24/12 1432  BP: 126/70    General appearance:  Normal Head/Neck:  Normal, without cervical or supraclavicular adenopathy. Thyroid:  Symmetrical, normal in size, without palpable masses or nodularity. Respiratory  Effort:  Normal  Auscultation:  Clear without wheezing or rhonchi Cardiovascular  Auscultation:  Regular rate, without rubs, murmurs or gallops  Edema/varicosities:  Not grossly evident Abdominal  Soft,nontender, without masses, guarding or rebound.  Liver/spleen:  No organomegaly noted  Hernia:  None appreciated  Skin  Inspection:  Grossly normal  Palpation:  Grossly normal Neurologic/psychiatric  Orientation:  Normal with appropriate conversation.  Mood/affect:  Normal  Genitourinary    Breasts: Examined lying and sitting.     Right: Without masses, retractions, discharge or axillary adenopathy.     Left: Without masses, retractions, discharge or axillary adenopathy.   Inguinal/mons:  Normal without inguinal adenopathy  External genitalia:  Normal  BUS/Urethra/Skene's glands:  Normal  Bladder:  Normal  Vagina:  Normal  Cervix:  Normal  Uterus:  normal in size, shape and contour.   Midline and mobile  Adnexa/parametria:     Rt: Without masses or tenderness.   Lt: Without masses or tenderness.  Anus and perineum: Normal    Assessment/Plan:  26 y.o. S. WF G1 P1 for annual exam with complaint of midcycle bleeding for several months, normal TSH, prolactin, FSH and ultrasound 12/2011.     STD screen DUB Contraception management CIN-1/HPV C&B-8/12, normal Pap 3/13  Plan: No Pap, reviewed new screening guidelines will repeat next year. SBE's, exercise, calcium rich diet, MVI daily encouraged. Reviewed importance of weight loss and exercise in relationship to health and prevention of diabetes, history of GDM. Had labs at primary care, GC/Chlamydia, HIV, hepatitis B/C., RPR. Contraception options reviewed, prefers a pill will try Ortho-Cyclen prescription, proper use, slight risk for blood clots and strokes reviewed instructed to start after completing current pack of pills call if spotting continues. Condoms encouraged until permanent partner.    Harrington Challenger [facility], 3:32 PM 05/24/2012

## 2012-05-25 LAB — URINALYSIS W MICROSCOPIC + REFLEX CULTURE
Bacteria, UA: NONE SEEN
Bilirubin Urine: NEGATIVE
Casts: NONE SEEN
Crystals: NONE SEEN
Glucose, UA: NEGATIVE mg/dL
Ketones, ur: NEGATIVE mg/dL
Specific Gravity, Urine: <1.005 — ABNORMAL LOW (ref 1.005–1.030)
Squamous Epithelial / LPF: NONE SEEN
Urobilinogen, UA: 0.2 mg/dL (ref 0.0–1.0)
pH: 6.5 (ref 5.0–8.0)

## 2012-05-25 LAB — HEPATITIS B SURFACE ANTIGEN: Hepatitis B Surface Ag: NEGATIVE

## 2012-05-25 LAB — GC/CHLAMYDIA PROBE AMP, GENITAL
Chlamydia, DNA Probe: NEGATIVE
GC Probe Amp, Genital: NEGATIVE

## 2012-07-04 ENCOUNTER — Telehealth: Payer: Self-pay

## 2012-07-04 DIAGNOSIS — F988 Other specified behavioral and emotional disorders with onset usually occurring in childhood and adolescence: Secondary | ICD-10-CM

## 2012-07-04 NOTE — Telephone Encounter (Signed)
Pt states time for adderall refill,states dr Merla Riches says he will authorize 3 refills this time for her???   Best phone [phone]

## 2012-07-05 MED ORDER — AMPHETAMINE-DEXTROAMPHETAMINE 30 MG PO TABS: 15.0000 mg | ORAL_TABLET | Freq: Two times a day (BID) | ORAL | Status: DC

## 2012-07-05 MED ORDER — AMPHETAMINE-DEXTROAMPHETAMINE 30 MG PO TABS: ORAL_TABLET | ORAL | Status: DC

## 2012-07-05 NOTE — Telephone Encounter (Signed)
She is okay for 3 refills which I just printed--not scheduled to be there til Sat 10am-might be by afternoon9/20 but unsure Meds ordered this encounter  Medications  . amphetamine-dextroamphetamine (ADDERALL) 30 MG tablet    Sig: Take 0.5-1 tablets (15-30 mg total) by mouth 2 (two) times daily. 1 tablet in the morning, and 0.5 tablet in the evening/ for 07/06/12 or after    Dispense:  45 tablet    Refill:  0  . amphetamine-dextroamphetamine (ADDERALL) 30 MG tablet    Sig: Take one tablet in the morning and 0.5 tablet in the afternoon for 08/05/2012 or after    Dispense:  45 tablet    Refill:  0  . amphetamine-dextroamphetamine (ADDERALL) 30 MG tablet    Sig: Take 0.5-1 tablets (15-30 mg total) by mouth 2 (two) times daily. 1 tablet in the morning, and 0.5 tablet in the evening/ for 09/05/12 or after    Dispense:  45 tablet    Refill:  0

## 2012-07-05 NOTE — Telephone Encounter (Signed)
Dr Merla Riches, do you want to RF adderall x 3? At 7/24 OV you wrote 1 RF to be filled after 06/06/12 and said she can call back for 2 RFs.

## 2012-07-05 NOTE — Telephone Encounter (Signed)
Marylene Land signed RFs printed by Dr Merla Riches and called pt to notify ready for p/up. Advised pt to RTC before the last RF is gone. Pt agreed.

## 2012-07-31 ENCOUNTER — Emergency Department (HOSPITAL_COMMUNITY)
Admission: EM | Admit: 2012-07-31 | Discharge: 2012-08-01 | Disposition: A | Discharge status: Other Acute Inpatient Hospital | Payer: Medicaid Other | Source: Home / Self Care

## 2012-07-31 ENCOUNTER — Ambulatory Visit (HOSPITAL_COMMUNITY)
Admission: EM | Admit: 2012-07-31 | Discharge: 2012-07-31 | Disposition: A | Discharge status: Home / Self Care | Payer: Medicaid Other | Attending: Psychiatry | Admitting: Psychiatry

## 2012-07-31 ENCOUNTER — Encounter (HOSPITAL_COMMUNITY): Payer: Self-pay | Admitting: *Deleted

## 2012-07-31 ENCOUNTER — Encounter (HOSPITAL_COMMUNITY): Payer: Self-pay | Admitting: Emergency Medicine

## 2012-07-31 DIAGNOSIS — F411 Generalized anxiety disorder: Secondary | ICD-10-CM | POA: Insufficient documentation

## 2012-07-31 DIAGNOSIS — F1310 Sedative, hypnotic or anxiolytic abuse, uncomplicated: Secondary | ICD-10-CM | POA: Insufficient documentation

## 2012-07-31 DIAGNOSIS — F988 Other specified behavioral and emotional disorders with onset usually occurring in childhood and adolescence: Secondary | ICD-10-CM | POA: Insufficient documentation

## 2012-07-31 DIAGNOSIS — F1020 Alcohol dependence, uncomplicated: Secondary | ICD-10-CM | POA: Insufficient documentation

## 2012-07-31 DIAGNOSIS — F909 Attention-deficit hyperactivity disorder, unspecified type: Secondary | ICD-10-CM | POA: Insufficient documentation

## 2012-07-31 DIAGNOSIS — Z79899 Other long term (current) drug therapy: Secondary | ICD-10-CM | POA: Insufficient documentation

## 2012-07-31 DIAGNOSIS — F1010 Alcohol abuse, uncomplicated: Secondary | ICD-10-CM | POA: Insufficient documentation

## 2012-07-31 DIAGNOSIS — F410 Panic disorder [episodic paroxysmal anxiety] without agoraphobia: Secondary | ICD-10-CM | POA: Insufficient documentation

## 2012-07-31 LAB — CBC
HCT: 40.7 % (ref 36.0–46.0)
Platelets: 314 10*3/uL (ref 150–400)
RBC: 4.59 MIL/uL (ref 3.87–5.11)
RDW: 12.7 % (ref 11.5–15.5)
WBC: 8.2 10*3/uL (ref 4.0–10.5)

## 2012-07-31 LAB — PREGNANCY, URINE: Preg Test, Ur: NEGATIVE

## 2012-07-31 NOTE — ED Notes (Signed)
Pt alert, arrives from home, family sent for pt to go to rehab, pt c/o "panic attack", pt tearful in triage, denies SI/HI, resp even unlabored, skin pwd
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
## 2012-07-31 NOTE — ED Notes (Signed)
Pt wanded by security; changed into scrubs; ring given to mother; pt states can't get nose ring out

## 2012-08-01 ENCOUNTER — Inpatient Hospital Stay (HOSPITAL_COMMUNITY)
Admission: EM | Admit: 2012-08-01 | Discharge: 2012-08-03 | DRG: 897 | Disposition: A | Discharge status: Home / Self Care | Payer: Medicaid Other | Source: Ambulatory Visit | Attending: Psychiatry | Admitting: Psychiatry

## 2012-08-01 ENCOUNTER — Encounter (HOSPITAL_COMMUNITY): Payer: Self-pay

## 2012-08-01 DIAGNOSIS — F909 Attention-deficit hyperactivity disorder, unspecified type: Secondary | ICD-10-CM | POA: Diagnosis present

## 2012-08-01 DIAGNOSIS — F411 Generalized anxiety disorder: Secondary | ICD-10-CM | POA: Diagnosis present

## 2012-08-01 DIAGNOSIS — IMO0001 Reserved for inherently not codable concepts without codable children: Secondary | ICD-10-CM

## 2012-08-01 DIAGNOSIS — F1010 Alcohol abuse, uncomplicated: Principal | ICD-10-CM | POA: Diagnosis present

## 2012-08-01 LAB — COMPREHENSIVE METABOLIC PANEL
AST: 26 U/L (ref 0–37)
Albumin: 3.9 g/dL (ref 3.5–5.2)
Alkaline Phosphatase: 74 U/L (ref 39–117)
Chloride: 101 mEq/L (ref 96–112)
Potassium: 3.9 mEq/L (ref 3.5–5.1)
Total Bilirubin: 0.2 mg/dL — ABNORMAL LOW (ref 0.3–1.2)

## 2012-08-01 LAB — RAPID URINE DRUG SCREEN, HOSP PERFORMED
Amphetamines: NOT DETECTED
Barbiturates: NOT DETECTED
Tetrahydrocannabinol: NOT DETECTED

## 2012-08-01 MED ORDER — CHLORDIAZEPOXIDE HCL 25 MG PO CAPS: 25.0000 mg | ORAL_CAPSULE | Freq: Four times a day (QID) | ORAL | Status: DC | PRN

## 2012-08-01 MED ORDER — TRAZODONE HCL 50 MG PO TABS
50.0000 mg | ORAL_TABLET | Freq: Every evening | ORAL | Status: DC | PRN
  Administered 2012-08-01 – 2012-08-02 (×2): 50 mg via ORAL
  Filled 2012-08-01 (×2): qty 1

## 2012-08-01 MED ORDER — ACETAMINOPHEN 325 MG PO TABS: 650.0000 mg | ORAL_TABLET | Freq: Four times a day (QID) | ORAL | Status: DC | PRN

## 2012-08-01 MED ORDER — MAGNESIUM HYDROXIDE 400 MG/5ML PO SUSP: 30.0000 mL | Freq: Every day | ORAL | Status: DC | PRN

## 2012-08-01 MED ORDER — CHLORDIAZEPOXIDE HCL 25 MG PO CAPS: 25.0000 mg | ORAL_CAPSULE | ORAL | Status: DC

## 2012-08-01 MED ORDER — ALUM & MAG HYDROXIDE-SIMETH 200-200-20 MG/5ML PO SUSP: 30.0000 mL | ORAL | Status: DC | PRN

## 2012-08-01 MED ORDER — TRAMADOL HCL 50 MG PO TABS
50.0000 mg | ORAL_TABLET | Freq: Once | ORAL | Status: AC
  Administered 2012-08-01: 50 mg via ORAL
  Filled 2012-08-01: qty 1

## 2012-08-01 MED ORDER — CHLORDIAZEPOXIDE HCL 25 MG PO CAPS
25.0000 mg | ORAL_CAPSULE | Freq: Four times a day (QID) | ORAL | Status: AC
  Administered 2012-08-01 – 2012-08-03 (×5): 25 mg via ORAL
  Filled 2012-08-01 (×5): qty 1

## 2012-08-01 MED ORDER — NORGESTIMATE-ETH ESTRADIOL 0.25-35 MG-MCG PO TABS
1.0000 | ORAL_TABLET | Freq: Every day | ORAL | Status: DC
  Administered 2012-08-01 – 2012-08-03 (×3): 1 via ORAL

## 2012-08-01 MED ORDER — AMPHETAMINE-DEXTROAMPHETAMINE 10 MG PO TABS
30.0000 mg | ORAL_TABLET | Freq: Two times a day (BID) | ORAL | Status: DC
  Administered 2012-08-01: 30 mg via ORAL
  Filled 2012-08-01: qty 3

## 2012-08-01 MED ORDER — LORAZEPAM 1 MG PO TABS
1.0000 mg | ORAL_TABLET | Freq: Four times a day (QID) | ORAL | Status: DC | PRN
  Filled 2012-08-01: qty 1

## 2012-08-01 MED ORDER — LORAZEPAM 2 MG/ML IJ SOLN: 1.0000 mg | Freq: Four times a day (QID) | INTRAMUSCULAR | Status: DC | PRN

## 2012-08-01 MED ORDER — ADULT MULTIVITAMIN W/MINERALS CH
1.0000 | ORAL_TABLET | Freq: Every day | ORAL | Status: DC
  Administered 2012-08-01: 1 via ORAL
  Filled 2012-08-01: qty 1

## 2012-08-01 MED ORDER — AMPHETAMINE-DEXTROAMPHETAMINE 10 MG PO TABS
15.0000 mg | ORAL_TABLET | Freq: Every evening | ORAL | Status: DC
  Administered 2012-08-02 – 2012-08-03 (×2): 15 mg via ORAL
  Filled 2012-08-01 (×2): qty 2

## 2012-08-01 MED ORDER — CHLORDIAZEPOXIDE HCL 25 MG PO CAPS: 25.0000 mg | ORAL_CAPSULE | Freq: Three times a day (TID) | ORAL | Status: DC

## 2012-08-01 MED ORDER — THIAMINE HCL 100 MG/ML IJ SOLN
100.0000 mg | Freq: Once | INTRAMUSCULAR | Status: AC
  Administered 2012-08-01: 18:00:00 via INTRAMUSCULAR

## 2012-08-01 MED ORDER — ADULT MULTIVITAMIN W/MINERALS CH
1.0000 | ORAL_TABLET | Freq: Every day | ORAL | Status: DC
  Administered 2012-08-02 – 2012-08-03 (×2): 1 via ORAL
  Filled 2012-08-01 (×4): qty 1

## 2012-08-01 MED ORDER — FOLIC ACID 1 MG PO TABS
1.0000 mg | ORAL_TABLET | Freq: Every day | ORAL | Status: DC
  Administered 2012-08-01: 1 mg via ORAL
  Filled 2012-08-01: qty 1

## 2012-08-01 MED ORDER — LOPERAMIDE HCL 2 MG PO CAPS: 2.0000 mg | ORAL_CAPSULE | ORAL | Status: DC | PRN

## 2012-08-01 MED ORDER — CHLORDIAZEPOXIDE HCL 25 MG PO CAPS
25.0000 mg | ORAL_CAPSULE | Freq: Once | ORAL | Status: AC
  Administered 2012-08-01: 25 mg via ORAL
  Filled 2012-08-01: qty 1

## 2012-08-01 MED ORDER — LORAZEPAM 1 MG PO TABS: 0.0000 mg | ORAL_TABLET | Freq: Two times a day (BID) | ORAL | Status: DC

## 2012-08-01 MED ORDER — AMPHETAMINE-DEXTROAMPHETAMINE 10 MG PO TABS
30.0000 mg | ORAL_TABLET | Freq: Every day | ORAL | Status: DC
  Administered 2012-08-02 – 2012-08-03 (×2): 30 mg via ORAL
  Filled 2012-08-01: qty 2
  Filled 2012-08-01: qty 1
  Filled 2012-08-01: qty 3

## 2012-08-01 MED ORDER — ONDANSETRON 4 MG PO TBDP: 4.0000 mg | ORAL_TABLET | Freq: Four times a day (QID) | ORAL | Status: DC | PRN

## 2012-08-01 MED ORDER — ZOLPIDEM TARTRATE 5 MG PO TABS: 5.0000 mg | ORAL_TABLET | Freq: Every evening | ORAL | Status: DC | PRN

## 2012-08-01 MED ORDER — HYDROXYZINE HCL 25 MG PO TABS
25.0000 mg | ORAL_TABLET | Freq: Four times a day (QID) | ORAL | Status: DC | PRN
  Administered 2012-08-01: 25 mg via ORAL

## 2012-08-01 MED ORDER — THIAMINE HCL 100 MG/ML IJ SOLN: 100.0000 mg | Freq: Every day | INTRAMUSCULAR | Status: DC

## 2012-08-01 MED ORDER — LORAZEPAM 1 MG PO TABS: 1.0000 mg | ORAL_TABLET | Freq: Three times a day (TID) | ORAL | Status: DC | PRN

## 2012-08-01 MED ORDER — LORAZEPAM 1 MG PO TABS
0.0000 mg | ORAL_TABLET | Freq: Four times a day (QID) | ORAL | Status: DC
  Administered 2012-08-01: 1 mg via ORAL
  Administered 2012-08-01: 2 mg via ORAL
  Filled 2012-08-01: qty 2

## 2012-08-01 MED ORDER — IBUPROFEN 600 MG PO TABS: 600.0000 mg | ORAL_TABLET | Freq: Three times a day (TID) | ORAL | Status: DC | PRN

## 2012-08-01 MED ORDER — ONDANSETRON HCL 4 MG PO TABS: 4.0000 mg | ORAL_TABLET | Freq: Three times a day (TID) | ORAL | Status: DC | PRN

## 2012-08-01 MED ORDER — VITAMIN B-1 100 MG PO TABS
100.0000 mg | ORAL_TABLET | Freq: Every day | ORAL | Status: DC
  Administered 2012-08-01: 100 mg via ORAL
  Filled 2012-08-01: qty 1

## 2012-08-01 MED ORDER — VITAMIN B-1 100 MG PO TABS
100.0000 mg | ORAL_TABLET | Freq: Every day | ORAL | Status: DC
  Administered 2012-08-02 – 2012-08-03 (×2): 100 mg via ORAL
  Filled 2012-08-01 (×4): qty 1

## 2012-08-01 MED ORDER — AMPHETAMINE-DEXTROAMPHETAMINE 30 MG PO TABS: 15.0000 mg | ORAL_TABLET | Freq: Two times a day (BID) | ORAL | Status: DC

## 2012-08-01 MED ORDER — ACETAMINOPHEN 325 MG PO TABS: 650.0000 mg | ORAL_TABLET | ORAL | Status: DC | PRN

## 2012-08-01 MED ORDER — CHLORDIAZEPOXIDE HCL 25 MG PO CAPS: 25.0000 mg | ORAL_CAPSULE | Freq: Every day | ORAL | Status: DC

## 2012-08-01 NOTE — ED Notes (Signed)
Report called to Donna,RN at [facility]. BH ready for [facility] to send pt.
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
## 2012-08-01 NOTE — ED Notes (Signed)
Pt pocketbook/cell phone and bag of belongings taken home by mother

## 2012-08-01 NOTE — ED Provider Notes (Signed)
History     CSN: [id number]  Arrival date & time 07/31/12  2237   First MD Initiated Contact with Patient 08/01/12 0008      Chief Complaint  Patient presents with  . Medical Clearance    (Consider location/radiation/quality/duration/timing/severity/associated sxs/prior treatment) HPI Comments: Patient presents today voluntarily requesting detox from alcohol.  She states that she drinks alcohol pretty much everyday and feels that she needs help.  Last alcohol drink was at approximately 6 pm today.  Patient states that she has never gone through detox before.  She denies recreational drug use, aside from occasional marijuana use.  She denies SI or HI.    The history is provided by the patient (mother).    Past Medical History  Diagnosis Date  . ADD (attention deficit disorder with hyperactivity)   . Anxiety   . GDM (gestational diabetes mellitus)   . Pre-eclampsia   . Hair loss   . 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 Colposcopy 05/2011    lgsil    Family History  Problem Relation Age of Onset  . Breast cancer Mother   . Liver disease Mother   . Diabetes Maternal Uncle     History  Substance Use Topics  . Smoking status: Never Smoker   . Smokeless tobacco: Never Used  . Alcohol Use: 16.8 oz/week    28 Glasses of wine per week    OB History    Grav Para Term Preterm Abortions TAB SAB Ect Mult Living   1 1        1       Review of Systems  All other systems reviewed and are negative.    Allergies  Review of patient's allergies indicates no known allergies.  Home Medications   Current Outpatient Rx  Name Route Sig Dispense Refill  . ALPRAZOLAM 1 MG PO TABS Oral Take 1 tablet (1 mg total) by mouth 2 (two) times daily. 60 tablet 5  . AMPHETAMINE-DEXTROAMPHETAMINE 30 MG PO TABS Oral Take 0.5-1 tablets (15-30 mg total) by mouth 2 (two) times daily. 1 tablet in the morning, and 0.5 tablet  in the evening/ for 07/06/12 or after 45 tablet 0  . AMPHETAMINE-DEXTROAMPHETAMINE 30 MG PO TABS  Take one tablet in the morning and 0.5 tablet in the afternoon for 08/05/2012 or after 45 tablet 0  . AMPHETAMINE-DEXTROAMPHETAMINE 30 MG PO TABS Oral Take 0.5-1 tablets (15-30 mg total) by mouth 2 (two) times daily. 1 tablet in the morning, and 0.5 tablet in the evening/ for 09/05/12 or after 45 tablet 0  . CYCLOBENZAPRINE HCL 10 MG PO TABS Oral Take 10 mg by mouth 3 (three) times daily as needed.    [name] HYDROCODONE-ACETAMINOPHEN PO Oral Take by mouth.    . MELOXICAM 15 MG PO TABS Oral Take 1 tablet (15 mg total) by mouth daily. 30 tablet 0  . ADULT MULTIVITAMIN W/MINERALS CH Oral Take 1 tablet by mouth daily.    [name] NORGESTIMATE-ETH ESTRADIOL 0.25-35 MG-MCG PO TABS Oral Take 1 tablet by mouth daily. 28 tablet 12  . TRAMADOL HCL 50 MG PO TABS Oral Take 50 mg by mouth once.      BP 115/83  Pulse 102  Temp 97.7 F (36.5 C) (Oral)  Resp 18  SpO2 98%  Physical Exam  Nursing note and vitals reviewed. Constitutional: She appears well-developed and well-nourished.  HENT:  Head: Normocephalic and atraumatic.  Mouth/Throat: Oropharynx is  clear and moist.  Eyes: EOM are normal. Pupils are equal, round, and reactive to light.  Neck: Normal range of motion. Neck supple.  Cardiovascular: Normal rate, regular rhythm and normal heart sounds.   Pulmonary/Chest: Effort normal and breath sounds normal.  Neurological: She is alert.  Skin: Skin is warm and dry. She is not diaphoretic.  Psychiatric: Her speech is normal. Her mood appears anxious. She is agitated. She expresses no homicidal and no suicidal ideation. She expresses no suicidal plans and no homicidal plans.    ED Course  Procedures (including critical care time)  Labs Reviewed  COMPREHENSIVE METABOLIC PANEL - Abnormal; Notable for the following:    Glucose, Bld 108 (*)     Total Bilirubin 0.2 (*)     All other components within normal limits    ETHANOL - Abnormal; Notable for the following:    Alcohol, Ethyl (B) 256 (*)     All other components within normal limits  URINE RAPID DRUG SCREEN (HOSP PERFORMED) - Abnormal; Notable for the following:    Benzodiazepines POSITIVE (*)     All other components within normal limits  CBC  PREGNANCY, URINE   No results found.   No diagnosis found.    MDM  Patient presenting today requesting Alcohol Detox.  She denies SI or HI.  Psych holding orders have been placed.  CIWA orders have been placed.  ACT team has been consulted.        Pascal Lux [place], PA-C 08/01/12 1253

## 2012-08-01 NOTE — Progress Notes (Signed)
D: Pt signed a 72 hour request for d/c. Donell Sievert PA aware.

## 2012-08-01 NOTE — ED Notes (Signed)
Checked pt.'s locker in psych ED, locker 36.  No belongings found, documation found that pt.'s family took all her belongings home.  Informed pt. Of this.

## 2012-08-01 NOTE — ED Provider Notes (Signed)
No issues overnight. Patient still anxious, presenting here with anxiety attack. She said that ativan helps her only a little. Otherwise no problems. She is accepted to [facility], pending bed availability.   Richardean Canal, MD 08/01/12 [phone]

## 2012-08-02 DIAGNOSIS — Z309 Encounter for contraceptive management, unspecified: Secondary | ICD-10-CM
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
## 2012-08-02 NOTE — Progress Notes (Addendum)
Pt states she is ready for discharge in the am. States she feels the groups are helping but she needs to go back home. Pt has been in the milue and has been attending all groups.Pt would like to focus more on herself and try to exercise more. Her energy level is still low but states her appetite is good. Pt does contract for safety and denies SI and HI. Remains in a safe environment and has Q 15 minutes. Report to [facility].

## 2012-08-02 NOTE — Tx Team (Signed)
Interdisciplinary Treatment Plan Update (Adult)  Date:  08/02/2012  Time Reviewed:  9:59 AM   Progress in Treatment: Attending groups: Yes Participating in groups:  Yes Taking medication as prescribed: Yes Tolerating medication:  Yes Family/Significant othe contact made:  Counselor assessing for appropriate contact Patient understands diagnosis:  Yes Discussing patient identified problems/goals with staff:  Yes Medical problems stabilized or resolved:  Yes Denies suicidal/homicidal ideation: Yes Issues/concerns per patient self-inventory:  None identified Other: N/A  New problem(s) identified: None Identified  Reason for Continuation of Hospitalization: Withdrawal Symptoms Medication stabilization  Interventions implemented related to continuation of hospitalization: mood stabilization, medication monitoring and adjustment, group therapy and psycho education, safety checks q 15 mins  Additional comments: N/A  Estimated length of stay: 3-5 days  Discharge Plan: SW is assessing for appropriate referrals.    New goal(s): N/A  Review of initial/current patient goals per problem list:    1.  Goal(s): Address substance use  Met:  No  Target date: by discharge  As evidenced by: completing detox protocol and refer to appropriate treatment   Attendees: Patient:     Family:     Physician: Patrick North, MD 08/02/2012 9:59 AM   Nursing: Roswell Miners, RN 08/02/2012 9:59 AM   Case Manager:  Reyes Ivan, LCSWA 08/02/2012  9:59 AM   Counselor:  Ronda Fairly, LCSWA 08/02/2012  9:59 AM   Other: Jules Schick, RN 08/02/2012 9:59 AM   Other:  Charlyne Mom, RN 08/02/2012 9:59 AM   Other:  Rodman Key, RN 08/02/2012 10:00 AM   Other:      Scribe for Treatment Team:   Reyes Ivan 08/02/2012 9:59 AM

## 2012-08-02 NOTE — Progress Notes (Signed)
[facility] Group Notes:  (Counselor/Nursing/MHT/Case Management/Adjunct)  08/02/2012 2:33 PM  Type of Therapy:  Group Therapy  Participation Level:  Active  Participation Quality:  Appropriate  Affect:  Appropriate  Cognitive:  Appropriate  Insight:  Good  Engagement in Group:  Good  Engagement in Therapy:  Good  Modes of Intervention:  Problem-solving  Summary of Progress/Problems: Pt. Was attentive and sharing during group focused on developing a balanced life. Pt. Shared challenge of co-dependent relationship with her boyfriend and concern that her sobriety would be dependent on her boyfriend's ability to stop drinking.   Jone Baseman 08/02/2012, 2:33 PM
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
## 2012-08-02 NOTE — Progress Notes (Signed)
D: Patient was in the [employer] at the beginning of the shift with her visitor. She attended [facility] and came to the window for her HS medication. Patient reported having a good day. Mood and affect flat, depressed and irritable. Pt requested for Trazodone for sleep at HS. She denied SI/Hi and denied withdrawal symptoms A: Writer encouraged and supported patient. Offered Trazodone as ordered. R: Patient received medication without difficulty. Q15 minute check continues to maintain safety.

## 2012-08-03 NOTE — Progress Notes (Signed)
[facility] Group Notes:  (Counselor/Nursing/MHT/Case Management/Adjunct)  08/03/2012 3:11 PM  Type of Therapy:  Psychoeducational Skills  Participation Level:  Active  Participation Quality:  Appropriate  Affect:  Appropriate  Cognitive:  Appropriate  Insight:  Good  Engagement in Group:  Good  Engagement in Therapy:  Good  Modes of Intervention:  Education  Summary of Progress/Problems:Staff engaged patient in a therapeutic group entitled "Early Warning Signs" and asked patient to think back to the episode that brought them into the hospital. Encourage them to identify how they were feeling and what they were thinking while in that crisis. Staff Emphasize the importance of  being aware of early warning signs in order to be better able to deal with a situation before it turns into a crisis. Patient was engaged in the group and provided positive insight on future.    Ardelle Park O 08/03/2012, 3:11 PM

## 2012-08-03 NOTE — ED Provider Notes (Signed)
Medical screening examination/treatment/procedure(s) were performed by non-physician practitioner and as supervising physician I was immediately available for consultation/collaboration.   Dusty Raczkowski L Yecenia Dalgleish, MD 08/03/12 2255 

## 2012-08-03 NOTE — [facility] Suicide Risk Assessment (Signed)
Suicide Risk Assessment  Discharge Assessment     Demographic Factors:  Female, caucasian, single  Mental Status Per Nursing Assessment::   On Admission:  NA  Current Mental Status by Physician: Patient alert and oriented to 4. Speech normal in rate and volume. Denies SI/HI/AH/VH.  Loss Factors: Decrease in vocational status  Historical Factors: Family history of mental illness or substance abuse and Impulsivity  Risk Reduction Factors:   Responsible for children under 72 years of age, Living with another person, especially a relative, Positive social support and Positive coping skills or problem solving skills  Continued Clinical Symptoms:  Alcohol/Substance Abuse/Dependencies  Cognitive Features That Contribute To Risk:  Thought constriction (tunnel vision)    Suicide Risk:  Minimal: No identifiable suicidal ideation.  Patients presenting with no risk factors but with morbid ruminations; may be classified as minimal risk based on the severity of the depressive symptoms  Discharge Diagnoses:   AXIS I:  Alcohol Abuse AXIS II:  No diagnosis AXIS III:   Past Medical History  Diagnosis Date  . ADD (attention deficit disorder with hyperactivity)   . Anxiety   . GDM (gestational diabetes mellitus)   . Pre-eclampsia   . Hair loss   . 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  Is patient on multiple antipsychotic therapies at discharge:  No   Has Patient had three or more failed trials of antipsychotic monotherapy by history:  No  Recommended Plan for Multiple Antipsychotic Therapies: NA  Gabrielle Zavala 08/03/2012, 10:09 AM

## 2012-08-03 NOTE — Progress Notes (Signed)
Nrsg DC note Pt is given AVS, stated she understands DC plans and will comply. She completed her self inventory this am and on it she wrote she denied Si within the past 24 hrs and she stated her DC plan is to " find a therapist and start an exercise program". All belongings that were previously locked up are returned to her and she signed appropriate paperwork per policy. Pt was escorted to bldg entrance and DC'd per plan PD RN [facility]

## 2012-08-03 NOTE — Discharge Summary (Signed)
Physician Discharge Summary Note  Patient:  Gabrielle Zavala is an 26 y.o., female MRN:  [id number] DOB:  06-30-1986 Patient phone:  [phone] (home)  Patient address:   [address] [place] [address]   Date of Admission:  08/01/2012 Date of Discharge:   Discharge Diagnoses: Alcohol abuse  Axis Diagnosis: AXIS I: Alcohol Abuse  AXIS II: No diagnosis  AXIS III:  Past Medical History   Diagnosis  Date   .  ADD (attention deficit disorder with hyperactivity)    .  Anxiety    .  GDM (gestational diabetes mellitus)    .  Pre-eclampsia    .  Hair loss    .  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  Reason for admission: Pt is a 26 y/o WF seeking inially voluntary commitment for alcohol detox without related polysubstance abuse. Pt at this time has signed paper work to proceed with D/C from [facility] because she feels she doesn't need medical and or psychiatric attention for alcohol detox in an inpatient setting. The patient has been screened and medically cleared per [facility] EDP prior to transition too [facility]. Pt reportedly has been casually drinking alcohol since age of 8. Pt states that she started drinking regularly over the last 2 - 3 years. Pt states she primarily drinks beer on the weekends and endorses less than a case of beer is consumed by her individually. Pt denies h/o of Dts or seizures related to alcohol cessation. Pt denies any prior h/o of inpatient detox due to alcohol abuse. Pt denies any pending legal issues public intoxication or DUI related to her drinking habits. Pt has a h/o of ADHD and panic d/o but denies depressive sx at this time. Pt states she can contract for safety as well as denies SI/SA/HI or AVH.      Hospital Course:   The patient attended treatment team meeting this am and met with treatment team members. The patient's  symptoms, treatment plan and response to treatment was discussed. The patient endorsed that their symptoms have improved. The patient also stated that they felt stable for discharge.  They reported that from this hospital stay they had learned many coping skills.  In other to maintain their psychiatric stability, they will continue psychiatric care on an outpatient basis. They will follow-up as outlined below.  In addition they were instructed  to take all your medications as prescribed by their mental healthcare provider and to report any adverse effects and or reactions from your medicines to their outpatient provider promptly.  The patient is also instructed and cautioned to not engage in alcohol and or illegal drug use while on prescription medicines.  In the event of worsening symptoms the patient is instructed to call the crisis hotline, 911 and or go to the nearest ED for appropriate evaluation and treatment of symptoms.   Also while a patient in this hospital, the patient received medication management for his psychiatric symptoms. They were ordered and received as outlined below:    Medication List     As of 08/03/2012 11:30 AM    STOP taking these medications         ALPRAZolam 1 MG tablet   Commonly known as: XANAX      multivitamin with minerals Tabs      traMADol 50 MG tablet   Commonly  known as: ULTRAM      TAKE these medications      Indication    amphetamine-dextroamphetamine 30 MG tablet   Commonly known as: ADDERALL   Take 15-30 mg by mouth 2 (two) times daily. 1 tablet (30mg ) in the am and 1/2 tablet (15mg ) in the afternoon      norgestimate-ethinyl estradiol 0.25-35 MG-MCG tablet  Commonly known as: ORTHO-CYCLEN,SPRINTEC,PREVIFEM  Take 1 tablet by mouth daily.        They were also enrolled in group counseling sessions and activities in which they participated actively.       Follow-up Information    Follow up with [facility]. On 08/06/2012. (intake appt  scheduled at 3:00p.m for outpatient therapy and medication management is neccessary)    Contact information:   [address]  [place], [place] [address] [phone] (phone) [phone] (fax)         Upon discharge, patient adamantly denies suicidal, homicidal ideations, auditory, visual hallucinations and or delusional thinking. They left [facility] with all personal belongings via personal transportation in no apparent distress.  Consults:  Please see electronic medical record for details.  Significant Diagnostic Studies:  Please see electronic medical record for details.  Discharge Vitals:   Blood pressure 147/93, pulse 104, temperature 98.4 F (36.9 C), temperature source Oral, resp. rate 18, height 5\' 5"  (1.651 m), weight 74.39 kg (164 lb), SpO2 98.00%..  Mental Status Exam: See Mental Status Examination and Suicide Risk Assessment completed by Attending Physician prior to discharge.  Discharge destination:  Home  Is patient on multiple antipsychotic therapies at discharge:  No  Has Patient had three or more failed trials of antipsychotic monotherapy by history: N/A Recommended Plan for Multiple Antipsychotic Therapies: N/A Discharge Orders    Future Orders Please Complete By Expires   Diet - low sodium heart healthy      Increase activity slowly          Medication List     As of 08/03/2012 11:30 AM    STOP taking these medications         ALPRAZolam 1 MG tablet   Commonly known as: XANAX      multivitamin with minerals Tabs      traMADol 50 MG tablet   Commonly known as: ULTRAM      TAKE these medications      Indication    amphetamine-dextroamphetamine 30 MG tablet   Commonly known as: ADDERALL   Take 15-30 mg by mouth 2 (two) times daily. 1 tablet (30mg ) in the am and 1/2 tablet (15mg ) in the afternoon      norgestimate-ethinyl estradiol 0.25-35 MG-MCG tablet  Commonly known as: ORTHO-CYCLEN,SPRINTEC,PREVIFEM  Take 1 tablet by mouth daily.              Follow-up Information    Follow up with [facility]. On 08/06/2012. (intake appt scheduled at 3:00p.m for outpatient therapy and medication management is neccessary)    Contact information:   [address] [place], [place] [address] [phone] (phone) [phone] (fax)        Follow-up recommendations:   Activities: Resume typical activities Diet: Resume typical diet Other: Follow up with outpatient provider and report any side effects to out patient prescriber.  Comments:  Take all your medications as prescribed by your mental healthcare provider. Report any adverse effects and or reactions from your medicines to your outpatient provider promptly. Patient is instructed and cautioned to not engage in  alcohol and or illegal drug use while on prescription medicines. In the event of worsening symptoms, patient is instructed to call the crisis hotline, 911 and or go to the nearest ED for appropriate evaluation and treatment of symptoms. Follow-up with your primary care provider for your other medical issues, concerns and or health care needs.  SignedDaleen Bo, Igor Bishop 08/03/2012 11:30 AM

## 2012-08-03 NOTE — Discharge Planning (Signed)
Pt was seen this morning during discharge planning group.  Pt stated that she is ready for discharge.  Upon discharge pt will return home to live with her mother and son.  Pt rated her level of depression and anxiety at a one. Pt denies SI/HI/AVH. Pt denies withdrawal symptoms. Pt does have transportation home.

## 2012-08-03 NOTE — Progress Notes (Signed)
Patient did attend the evening karaoke group.  

## 2012-08-06 NOTE — Progress Notes (Signed)
Patient Discharge Instructions:  After Visit Summary (AVS):   Faxed to:  08/06/2012 Psychiatric Admission Assessment Note:   Faxed to:  08/06/2012 Suicide Risk Assessment - Discharge Assessment:   Faxed to:  08/06/2012 Faxed/Sent to the Next Level Care provider:  08/06/2012  Faxed to [facility] @ [phone]  Wandra Scot, 08/06/2012, 5:48 PM

## 2012-10-04 ENCOUNTER — Telehealth: Payer: Self-pay

## 2012-10-04 DIAGNOSIS — F988 Other specified behavioral and emotional disorders with onset usually occurring in childhood and adolescence: Secondary | ICD-10-CM

## 2012-10-04 NOTE — Telephone Encounter (Signed)
Dr. Merla Riches - patient needs to pick up prescription for Adderall. Please call her when its ready. [phone]

## 2012-10-05 MED ORDER — AMPHETAMINE-DEXTROAMPHETAMINE 30 MG PO TABS: 15.0000 mg | ORAL_TABLET | Freq: Two times a day (BID) | ORAL | Status: DC

## 2012-10-05 NOTE — Telephone Encounter (Signed)
Followup in January  Meds ordered this encounter  Medications  . amphetamine-dextroamphetamine (ADDERALL) 30 MG tablet    Sig: Take 0.5-1 tablets (15-30 mg total) by mouth 2 (two) times daily. 1 tablet (30mg ) in the am and 1/2 tablet (15mg ) in the afternoon    Dispense:  60 tablet    Refill:  0

## 2012-10-06 NOTE — Telephone Encounter (Signed)
lmom that rx is ready for pickup.  

## 2012-11-06 ENCOUNTER — Telehealth: Payer: Self-pay

## 2012-11-06 DIAGNOSIS — F988 Other specified behavioral and emotional disorders with onset usually occurring in childhood and adolescence: Secondary | ICD-10-CM

## 2012-11-06 MED ORDER — AMPHETAMINE-DEXTROAMPHETAMINE 30 MG PO TABS: 15.0000 mg | ORAL_TABLET | Freq: Two times a day (BID) | ORAL | Status: DC

## 2012-11-06 NOTE — Telephone Encounter (Signed)
Pt is scheduled to see dr Merla Riches on 11/13/12 and is needing a written rx of adderall to pick up before then   Best number [phone]

## 2012-11-06 NOTE — Telephone Encounter (Signed)
Meds ordered this encounter  Medications  . amphetamine-dextroamphetamine (ADDERALL) 30 MG tablet    Sig: Take 0.5-1 tablets (15-30 mg total) by mouth 2 (two) times daily. 1 tablet (30mg ) in the am and 1/2 tablet (15mg ) in the afternoon    Dispense:  60 tablet    Refill:  0

## 2012-11-06 NOTE — Telephone Encounter (Signed)
Dr Merla Riches, pt last got RF on 10/05/12. Do you want to write a RF to cover her until her appt w/you on 11/13/12?

## 2012-11-07 NOTE — Telephone Encounter (Signed)
LMOM for pt to notify her Rx is ready for p/up.

## 2012-11-13 ENCOUNTER — Encounter: Payer: Self-pay | Admitting: Internal Medicine

## 2012-11-13 ENCOUNTER — Ambulatory Visit: Payer: Self-pay | Admitting: Internal Medicine

## 2012-11-13 VITALS — BP 138/94 | HR 92 | Temp 98.0°F | Resp 16 | Ht 65.5 in | Wt 153.0 lb

## 2012-11-13 DIAGNOSIS — F988 Other specified behavioral and emotional disorders with onset usually occurring in childhood and adolescence: Secondary | ICD-10-CM

## 2012-11-13 DIAGNOSIS — F411 Generalized anxiety disorder: Secondary | ICD-10-CM

## 2012-11-13 MED ORDER — ALPRAZOLAM 1 MG PO TABS: 1.0000 mg | ORAL_TABLET | Freq: Two times a day (BID) | ORAL | Status: DC | PRN

## 2012-11-13 MED ORDER — AMPHETAMINE-DEXTROAMPHETAMINE 30 MG PO TABS: 30.0000 mg | ORAL_TABLET | Freq: Two times a day (BID) | ORAL | Status: DC

## 2012-11-13 MED ORDER — ALPRAZOLAM 1 MG PO TABS: 1.0000 mg | ORAL_TABLET | Freq: Every evening | ORAL | Status: DC | PRN

## 2012-11-13 NOTE — Progress Notes (Signed)
  Subjective:    Patient ID: Gabrielle Zavala, female    DOB: 1986/04/12, 27 y.o.   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 ADD (attention deficit disorder)   2. GAD (generalized anxiety disorder)    Meds ordered this encounter  Medications  . amphetamine-dextroamphetamine (ADDERALL) 30 MG tablet    Sig: Take 1 tablet (30 mg total) by mouth 2 (two) times daily.    Dispense:  60 tablet    Refill:  0  . amphetamine-dextroamphetamine (ADDERALL) 30 MG tablet    Sig: Take 1 tablet (30 mg total) by mouth 2 (two) times daily.    Dispense:  60 tablet    Refill:  0   12/14/12  . amphetamine-dextroamphetamine (ADDERALL) 30 MG tablet    Sig: Take 1 tablet (30 mg total) by mouth 2 (two) times daily.    Dispense:  60 tablet    Refill:  0    01/11/13  . ALPRAZolam (XANAX) 1 MG tablet    Sig: Take 1 tablet (1 mg total) by mouth 2 (two) times daily as needed.    Dispense:  60 tablet    Refill:  5   May call for 3 more months  And  followup in 6 months

## 2012-12-01 ENCOUNTER — Other Ambulatory Visit: Payer: Self-pay

## 2013-01-21 ENCOUNTER — Encounter: Payer: Self-pay | Admitting: Women's Health

## 2013-01-21 ENCOUNTER — Ambulatory Visit (INDEPENDENT_AMBULATORY_CARE_PROVIDER_SITE_OTHER): Payer: Medicaid Other | Admitting: Women's Health

## 2013-01-21 DIAGNOSIS — N899 Noninflammatory disorder of vagina, unspecified: Secondary | ICD-10-CM

## 2013-01-21 DIAGNOSIS — N879 Dysplasia of cervix uteri, unspecified: Secondary | ICD-10-CM

## 2013-01-21 DIAGNOSIS — N898 Other specified noninflammatory disorders of vagina: Secondary | ICD-10-CM

## 2013-01-21 LAB — WET PREP FOR TRICH, YEAST, CLUE
Clue Cells Wet Prep HPF POC: NONE SEEN
Trich, Wet Prep: NONE SEEN
WBC, Wet Prep HPF POC: NONE SEEN

## 2013-01-21 NOTE — Progress Notes (Signed)
Patient ID: Gabrielle Zavala, female   DOB: May 29, 1986, 27 y.o.   MRN: [id number] Presents with the complaint of a vaginal bump with irritation at introitus, noticed yesterday, denies discharge with odor or itching. Cycle stopped today, uses Tampons. Contraceptives on Ortho-Cyclen/same partner. Denies any urinary symptoms, fever or pain.  Pap LGSIL 2012, colposcopy with biopsy LGSIL,  normal Pap 2013.  Exam: Appears well but somewhat anxious. External genitalia slight erythema at introitus, hymenal  tag noted, no visible abnormalities. Speculum exam cervix pink healthy, wet prep completely negative. Bimanual no CMT or adnexal fullness or tenderness.  Vaginal irritation  Plan: Reassurance given regarding normality of exam. Instructed to wear loose clothes and call if symptoms persist. Return in August for annual with Pap.

## 2013-03-06 ENCOUNTER — Telehealth: Payer: Self-pay

## 2013-03-06 DIAGNOSIS — F988 Other specified behavioral and emotional disorders with onset usually occurring in childhood and adolescence: Secondary | ICD-10-CM

## 2013-03-06 MED ORDER — AMPHETAMINE-DEXTROAMPHETAMINE 30 MG PO TABS: 30.0000 mg | ORAL_TABLET | Freq: Two times a day (BID) | ORAL | Status: DC

## 2013-03-06 NOTE — Telephone Encounter (Signed)
Pended please advise.  

## 2013-03-06 NOTE — Telephone Encounter (Signed)
Gabrielle Zavala is a pt of Merla Riches and she is wanting to get her addreall refilled and she states that Dr. Merla Riches was going to write her 3 months instead of the 1 month  Call back number is [phone]

## 2013-03-06 NOTE — Telephone Encounter (Signed)
Meds ordered this encounter  Medications  . amphetamine-dextroamphetamine (ADDERALL) 30 MG tablet    Sig: Take 1 tablet (30 mg total) by mouth 2 (two) times daily.    Dispense:  60 tablet    Refill:  0  . amphetamine-dextroamphetamine (ADDERALL) 30 MG tablet    Sig: Take 1 tablet (30 mg total) by mouth 2 (two) times daily. For 05/06/13    Dispense:  60 tablet    Refill:  0    Due for f/u before 06/06/13  . amphetamine-dextroamphetamine (ADDERALL) 30 MG tablet    Sig: Take 1 tablet (30 mg total) by mouth 2 (two) times daily. For 04/06/13    Dispense:  60 tablet    Refill:  0

## 2013-03-07 NOTE — Telephone Encounter (Signed)
Pt advised.

## 2013-05-15 ENCOUNTER — Ambulatory Visit: Payer: Self-pay | Admitting: Internal Medicine

## 2013-05-18 ENCOUNTER — Ambulatory Visit: Payer: Medicaid Other | Admitting: Internal Medicine

## 2013-05-18 VITALS — BP 130/88 | HR 88 | Temp 98.2°F | Resp 18 | Ht 65.5 in | Wt 147.2 lb

## 2013-05-18 DIAGNOSIS — F411 Generalized anxiety disorder: Secondary | ICD-10-CM

## 2013-05-18 DIAGNOSIS — F988 Other specified behavioral and emotional disorders with onset usually occurring in childhood and adolescence: Secondary | ICD-10-CM

## 2013-05-18 DIAGNOSIS — F419 Anxiety disorder, unspecified: Secondary | ICD-10-CM

## 2013-05-18 MED ORDER — ALPRAZOLAM 1 MG PO TABS: 1.0000 mg | ORAL_TABLET | Freq: Two times a day (BID) | ORAL | Status: DC | PRN

## 2013-05-18 MED ORDER — AMPHETAMINE-DEXTROAMPHETAMINE 30 MG PO TABS: 30.0000 mg | ORAL_TABLET | Freq: Two times a day (BID) | ORAL | Status: DC

## 2013-05-18 NOTE — Progress Notes (Signed)
  Subjective:    Patient ID: Gabrielle Zavala, female    DOB: [birth year], 27 y.o.   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   Adder works well Xanax prn contr sleep and anx problems--overall is improved  Review of Systems 0    Objective:   Physical Exam BP 130/88  Pulse 88  Temp(Src) 98.2 F (36.8 C) (Oral)  Resp 18  Ht 5' 5.5" (1.664 m)  Wt 147 lb 3.2 oz (66.769 kg)  BMI 24.11 kg/m2  SpO2 100%  LMP 05/06/2013 No acute distress HEENT clear Heart regular Cranial nerves II through XII intact Rest of neurological stable Mood good affect appropriate       Assessment & Plan:  Attention deficit disorder  Will refill at 30 mg twice a day however Medicaid no longer covers this since it is generic  She will talk to the pharmacist and Call for change to 30XR x 2 qam if medicaid will pay for that  Anxiety   Meds ordered this encounter  Medications  . amphetamine-dextroamphetamine (ADDERALL) 30 MG tablet    Sig: Take 1 tablet (30 mg total) by mouth 2 (two) times daily. 07/07/13    Dispense:  60 tablet    Refill:  0  . amphetamine-dextroamphetamine (ADDERALL) 30 MG tablet    Sig: Take 1 tablet (30 mg total) by mouth 2 (two) times daily. For 08/06/13    Dispense:  60 tablet    Refill:  0    Due for f/u before 06/06/13  . amphetamine-dextroamphetamine (ADDERALL) 30 MG tablet    Sig: Take 1 tablet (30 mg total) by mouth 2 (two) times daily. For 06/06/13    Dispense:  60 tablet    Refill:  0  . ALPRAZolam (XANAX) 1 MG tablet    Sig: Take 1 tablet (1 mg total) by mouth 2 (two) times daily as needed.    Dispense:  60 tablet    Refill:  5   Followup six-month

## 2013-06-13 ENCOUNTER — Other Ambulatory Visit: Payer: Self-pay | Admitting: Women's Health

## 2013-06-27 ENCOUNTER — Ambulatory Visit (INDEPENDENT_AMBULATORY_CARE_PROVIDER_SITE_OTHER): Payer: Medicaid Other | Admitting: Women's Health

## 2013-06-27 ENCOUNTER — Other Ambulatory Visit (HOSPITAL_COMMUNITY)
Admission: RE | Admit: 2013-06-27 | Discharge: 2013-06-27 | Disposition: A | Discharge status: Home / Self Care | Payer: Medicaid Other | Source: Ambulatory Visit | Attending: Women's Health | Admitting: Women's Health

## 2013-06-27 ENCOUNTER — Encounter: Payer: Self-pay | Admitting: Women's Health

## 2013-06-27 VITALS — BP 140/92 | Ht 65.0 in | Wt 148.8 lb

## 2013-06-27 DIAGNOSIS — Z1151 Encounter for screening for human papillomavirus (HPV): Secondary | ICD-10-CM | POA: Insufficient documentation

## 2013-06-27 DIAGNOSIS — IMO0001 Reserved for inherently not codable concepts without codable children: Secondary | ICD-10-CM

## 2013-06-27 DIAGNOSIS — N879 Dysplasia of cervix uteri, unspecified: Secondary | ICD-10-CM

## 2013-06-27 DIAGNOSIS — Z01419 Encounter for gynecological examination (general) (routine) without abnormal findings: Secondary | ICD-10-CM | POA: Insufficient documentation

## 2013-06-27 DIAGNOSIS — R87810 Cervical high risk human papillomavirus (HPV) DNA test positive: Secondary | ICD-10-CM | POA: Insufficient documentation

## 2013-06-27 DIAGNOSIS — Z113 Encounter for screening for infections with a predominantly sexual mode of transmission: Secondary | ICD-10-CM | POA: Insufficient documentation

## 2013-06-27 DIAGNOSIS — Z309 Encounter for contraceptive management, unspecified: Secondary | ICD-10-CM

## 2013-06-27 DIAGNOSIS — Z124 Encounter for screening for malignant neoplasm of cervix: Secondary | ICD-10-CM

## 2013-06-27 MED ORDER — NORGESTIMATE-ETH ESTRADIOL 0.25-35 MG-MCG PO TABS: ORAL_TABLET | ORAL | Status: DC

## 2013-06-27 NOTE — Progress Notes (Signed)
Gabrielle Zavala Jan 17, 1986 [id number]    History:    The patient presents for a conception management. Monthly cycle on Ortho-Cyclen. Engaged, planning wedding for June to 2015. Has had blood pressure elevations since age 27, had primary care evaluate on no medication. ADD on Adderall primary care. History of gestational diabetes. LGSIL confirmed with colposcopy 05/2011 normal Paps after. Had tried Micronor in the past had spotting. Has lost 18 pounds in the past year with diet and exercise.  Past medical history, past surgical history, family history and social history were all reviewed and documented in the EPIC chart.  Working at a Environmental consultant facility and likes it. [employer] 6, first grade doing well.   ROS:  A  ROS was performed and pertinent positives and negatives are included in the history.  Exam:  Filed Vitals:   06/27/13 1516  BP: 140/92    General appearance:  Normal Head/Neck:  Normal, without cervical or supraclavicular adenopathy. Thyroid:  Symmetrical, normal in size, without palpable masses or nodularity. Respiratory  Effort:  Normal  Auscultation:  Clear without wheezing or rhonchi Cardiovascular  Auscultation:  Regular rate, without rubs, murmurs or gallops  Edema/varicosities:  Not grossly evident Abdominal  Soft,nontender, without masses, guarding or rebound.  Liver/spleen:  No organomegaly noted  Hernia:  None appreciated  Skin  Inspection:  Grossly normal  Palpation:  Grossly normal Neurologic/psychiatric  Orientation:  Normal with appropriate conversation.  Mood/affect:  Normal  Genitourinary    Breasts: Examined lying and sitting.     Right: Without masses, retractions, discharge or axillary adenopathy.     Left: Without masses, retractions, discharge or axillary adenopathy.   Inguinal/mons:  Normal without inguinal adenopathy  External genitalia:  Normal  BUS/Urethra/Skene's glands:  Normal  Bladder:  Normal  Vagina:  Normal  Cervix:   Normal  Uterus:   normal in size, shape and contour.  Midline and mobile  Adnexa/parametria:     Rt: Without masses or tenderness.   Lt: Without masses or tenderness.  Anus and perineum: Normal  Digital rectal exam: Normal sphincter tone without palpated masses or tenderness  Assessment/Plan:  27 y.o. SWF G1P1 for a conception management and Pap smear.  LGSIL 2012 normal Paps after Contraception management ADD-Adderall primary care STD screen  Plan: Options reviewed, declines change, blood pressure written down and will followup with primary care. States blood pressure fluctuates between 120/72 140/90 for years. States no medication has ever been prescribed. Reviewed if blood pressure continues 140/90 importance of following up with primary care, stopping Ortho-Cyclen. Ortho-Cyclen prescription, proper use given and reviewed slight risk for blood clots and strokes especially with elevated blood pressure.  Pap, GC/Chlamydia, HIV, hep B, C., RPR.  Continue healthy diet and exercise for continued weight loss.     Harrington Challenger [facility], 5:05 PM 06/27/2013

## 2013-06-27 NOTE — Patient Instructions (Addendum)
B? 140/92 If continues > 130/80 follow up with PC Hypertension As your heart beats, it forces blood through your arteries. This force is your blood pressure. If the pressure is too high, it is called hypertension (HTN) or high blood pressure. HTN is dangerous because you may have it and not know it. High blood pressure may mean that your heart has to work harder to pump blood. Your arteries may be narrow or stiff. The extra work puts you at risk for heart disease, stroke, and other problems.  Blood pressure consists of two numbers, a higher number over a lower, 110/72, for example. It is stated as "110 over 72." The ideal is below 120 for the top number (systolic) and under 80 for the bottom (diastolic). Write down your blood pressure today. You should pay close attention to your blood pressure if you have certain conditions such as:  Heart failure.  Prior heart attack.  Diabetes  Chronic kidney disease.  Prior stroke.  Multiple risk factors for heart disease. To see if you have HTN, your blood pressure should be measured while you are seated with your arm held at the level of the heart. It should be measured at least twice. A one-time elevated blood pressure reading (especially in the Emergency Department) does not mean that you need treatment. There may be conditions in which the blood pressure is different between your right and left arms. It is important to see your caregiver soon for a recheck. Most people have essential hypertension which means that there is not a specific cause. This type of high blood pressure may be lowered by changing lifestyle factors such as:  Stress.  Smoking.  Lack of exercise.  Excessive weight.  Drug/tobacco/alcohol use.  Eating less salt. Most people do not have symptoms from high blood pressure until it has caused damage to the body. Effective treatment can often prevent, delay or reduce that damage. TREATMENT  When a cause has been identified,  treatment for high blood pressure is directed at the cause. There are a large number of medications to treat HTN. These fall into several categories, and your caregiver will help you select the medicines that are best for you. Medications may have side effects. You should review side effects with your caregiver. If your blood pressure stays high after you have made lifestyle changes or started on medicines,   Your medication(s) may need to be changed.  Other problems may need to be addressed.  Be certain you understand your prescriptions, and know how and when to take your medicine.  Be sure to follow up with your caregiver within the time frame advised (usually within two weeks) to have your blood pressure rechecked and to review your medications.  If you are taking more than one medicine to lower your blood pressure, make sure you know how and at what times they should be taken. Taking two medicines at the same time can result in blood pressure that is too low. SEEK IMMEDIATE MEDICAL CARE IF:  You develop a severe headache, blurred or changing vision, or confusion.  You have unusual weakness or numbness, or a faint feeling.  You have severe chest or abdominal pain, vomiting, or breathing problems. MAKE SURE YOU:   Understand these instructions.  Will watch your condition.  Will get help right away if you are not doing well or get worse. Document Released: 10/03/2005 Document Revised: 12/26/2011 Document Reviewed: 05/23/2008 [facility] Patient Information 2014 [place], [place].

## 2013-06-28 LAB — HEPATITIS C ANTIBODY: HCV Ab: NEGATIVE

## 2013-07-16 ENCOUNTER — Telehealth: Payer: Self-pay | Admitting: *Deleted

## 2013-07-16 NOTE — Telephone Encounter (Signed)
Pt informed with the below note. Pt will start taking pills today.

## 2013-07-16 NOTE — Telephone Encounter (Signed)
Usually best to wait until next cycle BUT best for Lexi to take daily starting now.  Encouraged to use condoms this month. Most important week for birth control pills is first week to help prevent ovulation.

## 2013-07-16 NOTE — Telephone Encounter (Signed)
Pt was seen on 06/27/13 for annual birth control pill sent to pharmacy. Pt didn't realize this was done and has missed 1 week of her pills. Pt asked should she just start taking pills? Double up on pill? Please advise

## 2013-07-17 DIAGNOSIS — IMO0002 Reserved for concepts with insufficient information to code with codable children: Secondary | ICD-10-CM

## 2013-07-17 HISTORY — DX: Reserved for concepts with insufficient information to code with codable children: IMO0002

## 2013-07-23 ENCOUNTER — Ambulatory Visit (INDEPENDENT_AMBULATORY_CARE_PROVIDER_SITE_OTHER): Payer: Medicaid Other | Admitting: Gynecology

## 2013-07-23 ENCOUNTER — Encounter: Payer: Self-pay | Admitting: Gynecology

## 2013-07-23 DIAGNOSIS — R6889 Other general symptoms and signs: Secondary | ICD-10-CM

## 2013-07-23 DIAGNOSIS — IMO0002 Reserved for concepts with insufficient information to code with codable children: Secondary | ICD-10-CM

## 2013-07-23 DIAGNOSIS — R87810 Cervical high risk human papillomavirus (HPV) DNA test positive: Secondary | ICD-10-CM

## 2013-07-23 NOTE — Patient Instructions (Signed)
Office will call you with biopsy results 

## 2013-07-23 NOTE — Progress Notes (Signed)
Patient ID: Gabrielle Zavala, female   DOB: [birth year], 27 y.o.   MRN: [id number] Patient presents for colposcopy with history of ASCUS, negative high risk HPV Pap smear July 2012. Follow up Pap smear August 2012 showed low-grade SIL. Colposcopy and biopsy showed low-grade SIL. Followup Pap smear 12/2011 was normal. Most recent Pap smear in 06/2013 showed LGSIL with positive high-risk HPV.  Exam with Gabrielle Zavala External BUS vagina normal. Cervix normal. Uterus normal size midline mobile nontender. Adnexa without masses or tenderness.  Colposcopy after acetic acid cleanse is adequate with small patch of acetowhite change 12:00 transformation zone. Rep. biopsy taken. ECC performed. 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 Biopsy/ECC performed. Patient will follow up for results. If low grade or normal then plan expectant management. Otherwise then we'll treat her results. Possible treatment options reviewed with patient to include LEEP.

## 2013-07-26 ENCOUNTER — Telehealth: Payer: Self-pay | Admitting: *Deleted

## 2013-07-26 ENCOUNTER — Encounter: Payer: Self-pay | Admitting: Gynecology

## 2013-07-26 NOTE — Telephone Encounter (Signed)
Pt called requesting pathology report results from visit 07/23/13. Left message on pt voicemail results are not back yet.

## 2013-08-06 ENCOUNTER — Telehealth: Payer: Self-pay

## 2013-08-06 DIAGNOSIS — F988 Other specified behavioral and emotional disorders with onset usually occurring in childhood and adolescence: Secondary | ICD-10-CM

## 2013-08-06 MED ORDER — AMPHETAMINE-DEXTROAMPHETAMINE 30 MG PO TABS: 30.0000 mg | ORAL_TABLET | Freq: Two times a day (BID) | ORAL | Status: DC

## 2013-08-06 NOTE — Telephone Encounter (Signed)
Not signed yet. Will call when ready.

## 2013-08-06 NOTE — Telephone Encounter (Signed)
Dr. Merla Riches: Patient needs to pick up her 3 month prescription for Adderrall 30 MG today if possible.  Her best number is [phone] when ready.

## 2013-08-06 NOTE — Telephone Encounter (Signed)
Patient calling back to check the status of her add medication [phone]

## 2013-08-06 NOTE — Telephone Encounter (Signed)
Pt states that she found her adderall but will need adderall rx's for November, December, and January.

## 2013-08-06 NOTE — Telephone Encounter (Signed)
Meds ordered this encounter  Medications  . amphetamine-dextroamphetamine (ADDERALL) 30 MG tablet    Sig: Take 1 tablet (30 mg total) by mouth 2 (two) times daily. 09/06/13    Dispense:  60 tablet    Refill:  0  . amphetamine-dextroamphetamine (ADDERALL) 30 MG tablet    Sig: Take 1 tablet (30 mg total) by mouth 2 (two) times daily. 10/06/13    Dispense:  60 tablet    Refill:  0  . amphetamine-dextroamphetamine (ADDERALL) 30 MG tablet    Sig: Take 1 tablet (30 mg total) by mouth 2 (two) times daily.    Dispense:  60 tablet    Refill:  0

## 2013-08-06 NOTE — Telephone Encounter (Signed)
lmom that rx is ready for pickup.  

## 2013-08-22 ENCOUNTER — Other Ambulatory Visit: Payer: Self-pay

## 2013-09-06 ENCOUNTER — Telehealth: Payer: Self-pay

## 2013-09-06 DIAGNOSIS — F988 Other specified behavioral and emotional disorders with onset usually occurring in childhood and adolescence: Secondary | ICD-10-CM

## 2013-09-06 NOTE — Telephone Encounter (Signed)
Left message to advise

## 2013-09-06 NOTE — Telephone Encounter (Signed)
Patient of Dr Merla Riches. Needs adderall rx written for 3 month supply. Cb# [phone]

## 2013-09-06 NOTE — Telephone Encounter (Signed)
At front for pick up, she did not pick up in October.

## 2013-12-09 ENCOUNTER — Telehealth: Payer: Self-pay | Admitting: Internal Medicine

## 2013-12-09 DIAGNOSIS — F411 Generalized anxiety disorder: Secondary | ICD-10-CM

## 2013-12-09 NOTE — Telephone Encounter (Signed)
Patient Called stating she need a refill on Xanax and Adderall, offered appointment to patient stated she need medication immediately And she may have to go to the walk in clinic Please advise

## 2013-12-10 ENCOUNTER — Telehealth: Payer: Self-pay

## 2013-12-10 MED ORDER — AMPHETAMINE-DEXTROAMPHETAMINE 30 MG PO TABS: 30.0000 mg | ORAL_TABLET | Freq: Two times a day (BID) | ORAL | Status: DC

## 2013-12-10 MED ORDER — ALPRAZOLAM 1 MG PO TABS: 1.0000 mg | ORAL_TABLET | Freq: Two times a day (BID) | ORAL | Status: DC | PRN

## 2013-12-10 NOTE — Telephone Encounter (Signed)
Patient is upset that she has not gotten a call back to get a refill on her medication please call when ready for pick up. Thank you! For xanax and adderall.  Patient says she see's Dr. Merla Richesoolittle and that someone told her it would be ready today.  [phone]

## 2013-12-10 NOTE — Telephone Encounter (Signed)
Notified pt ready for p/up  

## 2013-12-10 NOTE — Addendum Note (Signed)
Addended by: Tonye PearsonOLITTLE, ROBERT P on: 12/10/2013 12:35 PM   Modules accepted: Orders

## 2013-12-10 NOTE — Telephone Encounter (Signed)
Pt notified Rxs ready.

## 2013-12-10 NOTE — Telephone Encounter (Signed)
Meds ordered this encounter  Medications  . amphetamine-dextroamphetamine (ADDERALL) 30 MG tablet    Sig: Take 1 tablet (30 mg total) by mouth 2 (two) times daily.    Dispense:  60 tablet    Refill:  0  . ALPRAZolam (XANAX) 1 MG tablet    Sig: Take 1 tablet (1 mg total) by mouth 2 (two) times daily as needed.    Dispense:  60 tablet    Refill:  5

## 2014-01-16 ENCOUNTER — Telehealth: Payer: Self-pay

## 2014-01-16 DIAGNOSIS — F988 Other specified behavioral and emotional disorders with onset usually occurring in childhood and adolescence: Secondary | ICD-10-CM

## 2014-01-16 MED ORDER — AMPHETAMINE-DEXTROAMPHETAMINE 30 MG PO TABS: 30.0000 mg | ORAL_TABLET | Freq: Two times a day (BID) | ORAL | Status: DC

## 2014-01-16 NOTE — Telephone Encounter (Signed)
Meds ordered this encounter  Medications  . amphetamine-dextroamphetamine (ADDERALL) 30 MG tablet    Sig: Take 1 tablet (30 mg total) by mouth 2 (two) times daily.    Dispense:  60 tablet    Refill:  0  . amphetamine-dextroamphetamine (ADDERALL) 30 MG tablet    Sig: Take 1 tablet (30 mg total) by mouth 2 (two) times daily. For 30d after signed    Dispense:  60 tablet    Refill:  0  . amphetamine-dextroamphetamine (ADDERALL) 30 MG tablet    Sig: Take 1 tablet (30 mg total) by mouth 2 (two) times daily. For 60 d after signed    Dispense:  60 tablet    Refill:  0

## 2014-01-16 NOTE — Telephone Encounter (Signed)
Pt is requesting refill on aderrall

## 2014-01-17 NOTE — Telephone Encounter (Signed)
Notified pt on VM Rxs ready. 

## 2014-02-19 ENCOUNTER — Telehealth: Payer: Self-pay | Admitting: *Deleted

## 2014-02-19 ENCOUNTER — Ambulatory Visit (INDEPENDENT_AMBULATORY_CARE_PROVIDER_SITE_OTHER): Payer: Medicaid Other | Admitting: Women's Health

## 2014-02-19 VITALS — BP 138/88

## 2014-02-19 DIAGNOSIS — IMO0001 Reserved for inherently not codable concepts without codable children: Secondary | ICD-10-CM

## 2014-02-19 DIAGNOSIS — Z309 Encounter for contraceptive management, unspecified: Secondary | ICD-10-CM

## 2014-02-19 DIAGNOSIS — N644 Mastodynia: Secondary | ICD-10-CM

## 2014-02-19 MED ORDER — NORETHINDRONE 0.35 MG PO TABS: 1.0000 | ORAL_TABLET | Freq: Every day | ORAL | Status: DC

## 2014-02-19 NOTE — Progress Notes (Signed)
Patient ID: Gabrielle AcheMaria L Zavala, female   DOB: Mar 10, 1986, 28 y.o.   MRN: [id number] Presents with complaint of right breast pain behind areola for 1 week, no change in breast exam. Denies nipple discharge or injury. Cycle 1-1/2 weeks ago, contraceptives on Sprintec. Has had intermittent  headache for several weeks, history of blood pressure elevations in the past and preeclampsia. Reports blood pressure at home 140/105. Mother history of breast cancer.   Exam: Appears well. Blood pressure 138/88 x 2.  breast exam in sitting and lying position without palpable nodule, skin retractions, dimpling or nipple discharge.  Right breast mastodynia Elevated blood pressure with headaches Contraception management  Plan: Right breast ultrasound. Recheck blood pressure away from office if continues greater than 130/80 followup with primary care. Contraception options reviewed. Finish out current pack of pills and start on Micronor. Prescription, proper use was given and reviewed.

## 2014-02-19 NOTE — Telephone Encounter (Signed)
Order placed at breast center, they will contact pt to schedule. 

## 2014-02-19 NOTE — Telephone Encounter (Signed)
Message copied by Aura CampsWEBB, Leverne Tessler L on Wed Feb 19, 2014  4:13 PM ------      Message from: [place], [place] J      Created: Wed Feb 19, 2014  4:08 PM       Rt breast pain for 1 week,  No nipple discharge or change in exam, pain behind areala  Mother breast ca.  Rt breast ultrasound, best if after 1:30  (works 6-1)    ------

## 2014-02-21 ENCOUNTER — Telehealth: Payer: Self-pay | Admitting: *Deleted

## 2014-02-21 ENCOUNTER — Emergency Department (HOSPITAL_COMMUNITY)
Admission: EM | Admit: 2014-02-21 | Discharge: 2014-02-21 | Disposition: A | Discharge status: Home / Self Care | Payer: Medicaid Other | Attending: Emergency Medicine | Admitting: Emergency Medicine

## 2014-02-21 ENCOUNTER — Encounter (HOSPITAL_COMMUNITY): Payer: Self-pay | Admitting: Emergency Medicine

## 2014-02-21 DIAGNOSIS — Z8619 Personal history of other infectious and parasitic diseases: Secondary | ICD-10-CM | POA: Insufficient documentation

## 2014-02-21 DIAGNOSIS — Z79899 Other long term (current) drug therapy: Secondary | ICD-10-CM | POA: Insufficient documentation

## 2014-02-21 DIAGNOSIS — F411 Generalized anxiety disorder: Secondary | ICD-10-CM | POA: Insufficient documentation

## 2014-02-21 DIAGNOSIS — F988 Other specified behavioral and emotional disorders with onset usually occurring in childhood and adolescence: Secondary | ICD-10-CM | POA: Insufficient documentation

## 2014-02-21 DIAGNOSIS — Z8632 Personal history of gestational diabetes: Secondary | ICD-10-CM | POA: Insufficient documentation

## 2014-02-21 DIAGNOSIS — L659 Nonscarring hair loss, unspecified: Secondary | ICD-10-CM | POA: Insufficient documentation

## 2014-02-21 DIAGNOSIS — I10 Essential (primary) hypertension: Secondary | ICD-10-CM

## 2014-02-21 MED ORDER — HYDROCHLOROTHIAZIDE 12.5 MG PO TABS: 25.0000 mg | ORAL_TABLET | Freq: Every day | ORAL | Status: DC

## 2014-02-21 NOTE — ED Notes (Signed)
Patient is alert and oriented x3.  She was given DC instructions and follow up visit instructions.  Patient gave verbal understanding. She was DC ambulatory under her own power to home.  V/S stable.  He was not showing any signs of distress on DC 

## 2014-02-21 NOTE — Telephone Encounter (Signed)
Pt called to let you know her blood pressure reading about 20 mins ago was 150/105 took about 10 mins later and it was 138/88. Pt asked if you have recommendations?  Please advise

## 2014-02-21 NOTE — ED Provider Notes (Signed)
Medical screening examination/treatment/procedure(s) were performed by non-physician practitioner and as supervising physician I was immediately available for consultation/collaboration.   EKG Interpretation None        Dagmar HaitWilliam Kreston Ahrendt, MD 02/21/14 2322

## 2014-02-21 NOTE — ED Provider Notes (Signed)
CSN: [id number]     Arrival date & time 02/21/14  1825 History   First MD Initiated Contact with Patient 02/21/14 1844     Chief Complaint  Patient presents with  . Hypertension     (Consider location/radiation/quality/duration/timing/severity/associated sxs/prior Treatment) HPI  28 year old female with prior history of hypertension and prior history of preeclampsia, history of anxiety who presents for evaluations of high blood pressure. Patient states she has had intermittent headache for the past week along with intermittent lightheadedness dizziness sensation for several months. She also reported a history of preeclampsia hypertension 7 years ago in which she was briefly on it the hypertensive medication but currently not on any medication for her blood pressure. She does report that she has been undergoing some extensive testing for evaluation including kidney testing but states no specific finding were noted. She has been checking her blood pressure for the past week and it has fluctuates between 130s systolic to 150s systolic.  Today she also experienced tingling sensation to both side of her face and she decided to come to ER for further evaluation.  She denies any recent medication changes, no fever, chills, double vision, n/v/d, cp/sob, weakness, dysuria, hematuria.  Denies changes in stress level.  Does have a PCP and did schedule for a check up next week.    Past Medical History  Diagnosis Date  . ADD (attention deficit disorder with hyperactivity)   . Anxiety   . GDM (gestational diabetes mellitus)   . Pre-eclampsia   . Hair loss   . 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 Colposcopy  05/2011    lgsil   Family History  Problem Relation Age of Onset  . Liver disease Mother   . Breast cancer Mother [id number]  . Diabetes Maternal Uncle    History   Substance Use Topics  . Smoking status: Never Smoker   . Smokeless tobacco: Never Used  . Alcohol Use: 0.0 oz/week     Comment: 3-4 times a week   OB History   Grav Para Term Preterm Abortions TAB SAB Ect Mult Living   1 1        1      Review of Systems  All other systems reviewed and are negative.     Allergies  Review of patient's allergies indicates no known allergies.  Home Medications   Prior to Admission medications   Medication Sig Start Date End Date Taking? Authorizing Provider  ALPRAZolam Prudy Feeler(XANAX) 1 MG tablet Take 1 tablet (1 mg total) by mouth 2 (two) times daily as needed. 12/10/13   Tonye Pearsonobert P Doolittle, MD  amphetamine-dextroamphetamine (ADDERALL) 30 MG tablet Take 1 tablet (30 mg total) by mouth 2 (two) times daily. 01/16/14   Tonye Pearsonobert P Doolittle, MD  amphetamine-dextroamphetamine (ADDERALL) 30 MG tablet Take 1 tablet (30 mg total) by mouth 2 (two) times daily. For 30d after signed 01/16/14   Tonye Pearsonobert P Doolittle, MD  amphetamine-dextroamphetamine (ADDERALL) 30 MG tablet Take 1 tablet (30 mg total) by mouth 2 (two) times daily. For 60 d after signed 01/16/14   Tonye Pearsonobert P Doolittle, MD  norethindrone (ORTHO MICRONOR) 0.35 MG tablet Take 1 tablet (0.35 mg total) by mouth daily. 02/19/14   Harrington ChallengerNancy J Young, NP   BP 151/104  Pulse 96  Temp(Src) 98.6 F (37 C) (Oral)  Resp 20  SpO2 100%  LMP 02/04/2014  Physical Exam  Constitutional: She is oriented to person, place, and time. She appears well-developed and well-nourished. No distress.  HENT:  Head: Normocephalic and atraumatic.  Eyes: Conjunctivae are normal.  Neck: Normal range of motion. Neck supple. No JVD present.  Cardiovascular: Normal rate, regular rhythm and intact distal pulses.  Exam reveals no gallop and no friction rub.   No murmur heard. Pulmonary/Chest: Effort normal. No respiratory distress. She has no wheezes.  Abdominal: Soft. Bowel sounds are normal. There is no tenderness.  Musculoskeletal: Normal range of  motion. She exhibits no edema.  Neurological: She is alert and oriented to person, place, and time. She has normal strength. No cranial nerve deficit or sensory deficit. GCS eye subscore is 4. GCS verbal subscore is 5. GCS motor subscore is 6.    ED Course  Procedures (including critical care time)  7:25 PM Pt report elevated BP, provide me with a list of the blood pressure reading for the past week.  Highest reading 150s systolic. Her current BP is 130s systolic.  I have low suspicion for hypertensive emergency concerning for end organ damage.  She has a PCP f/u next week, therefore will prescribe a short course of HCTZ and have pt f/u with PCP for further care.    Labs Review Labs Reviewed - No data to display  Imaging Review No results found.   EKG Interpretation None      MDM   Final diagnoses:  Hypertension    BP 138/91  Pulse 96  Temp(Src) 98.6 F (37 C) (Oral)  Resp 20  SpO2 100%  LMP 02/04/2014     Fayrene HelperBowie Analyah Mcconnon, PA-C 02/21/14 1929

## 2014-02-21 NOTE — Discharge Instructions (Signed)
Hypertension  As your heart beats, it forces blood through your arteries. This force is your blood pressure. If the pressure is too high, it is called hypertension (HTN) or high blood pressure. HTN is dangerous because you may have it and not know it. High blood pressure may mean that your heart has to work harder to pump blood. Your arteries may be narrow or stiff. The extra work puts you at risk for heart disease, stroke, and other problems.   Blood pressure consists of two numbers, a higher number over a lower, 110/72, for example. It is stated as "110 over 72." The ideal is below 120 for the top number (systolic) and under 80 for the bottom (diastolic). Write down your blood pressure today.  You should pay close attention to your blood pressure if you have certain conditions such as:   Heart failure.   Prior heart attack.   Diabetes   Chronic kidney disease.   Prior stroke.   Multiple risk factors for heart disease.  To see if you have HTN, your blood pressure should be measured while you are seated with your arm held at the level of the heart. It should be measured at least twice. A one-time elevated blood pressure reading (especially in the Emergency Department) does not mean that you need treatment. There may be conditions in which the blood pressure is different between your right and left arms. It is important to see your caregiver soon for a recheck.  Most people have essential hypertension which means that there is not a specific cause. This type of high blood pressure may be lowered by changing lifestyle factors such as:   Stress.   Smoking.   Lack of exercise.   Excessive weight.   Drug/tobacco/alcohol use.   Eating less salt.  Most people do not have symptoms from high blood pressure until it has caused damage to the body. Effective treatment can often prevent, delay or reduce that damage.  TREATMENT    When a cause has been identified, treatment for high blood pressure is directed at the cause. There are a large number of medications to treat HTN. These fall into several categories, and your caregiver will help you select the medicines that are best for you. Medications may have side effects. You should review side effects with your caregiver.  If your blood pressure stays high after you have made lifestyle changes or started on medicines,    Your medication(s) may need to be changed.   Other problems may need to be addressed.   Be certain you understand your prescriptions, and know how and when to take your medicine.   Be sure to follow up with your caregiver within the time frame advised (usually within two weeks) to have your blood pressure rechecked and to review your medications.   If you are taking more than one medicine to lower your blood pressure, make sure you know how and at what times they should be taken. Taking two medicines at the same time can result in blood pressure that is too low.  SEEK IMMEDIATE MEDICAL CARE IF:   You develop a severe headache, blurred or changing vision, or confusion.   You have unusual weakness or numbness, or a faint feeling.   You have severe chest or abdominal pain, vomiting, or breathing problems.  MAKE SURE YOU:    Understand these instructions.   Will watch your condition.   Will get help right away if you are not doing well   or get worse.  Document Released: 10/03/2005 Document Revised: 12/26/2011 Document Reviewed: 05/23/2008  ExitCare Patient Information 2014 [employer]  DASH Diet   The DASH diet stands for "Dietary Approaches to Stop Hypertension." It is a healthy eating plan that has been shown to reduce high blood pressure (hypertension) in as little as 14 days, while also possibly providing other significant health benefits. These other health benefits include reducing the risk of breast cancer after menopause and reducing the risk of type 2 diabetes, heart disease, colon cancer, and stroke. Health benefits also include weight loss and slowing kidney failure in patients with chronic kidney disease.   DIET GUIDELINES   Limit salt (sodium). Your diet should contain less than 1500 mg of sodium daily.   Limit refined or processed carbohydrates. Your diet should include mostly whole grains. Desserts and added sugars should be used sparingly.   Include small amounts of heart-healthy fats. These types of fats include nuts, oils, and tub margarine. Limit saturated and trans fats. These fats have been shown to be harmful in the body.  CHOOSING FOODS   The following food groups are based on a 2000 calorie diet. See your Registered Dietitian for individual calorie needs.  Grains and Grain Products (6 to 8 servings daily)   Eat More Often: Whole-wheat bread, brown rice, whole-grain or wheat pasta, quinoa, popcorn without added fat or salt (air popped).   Eat Less Often: White bread, white pasta, white rice, cornbread.  Vegetables (4 to 5 servings daily)   Eat More Often: Fresh, frozen, and canned vegetables. Vegetables may be raw, steamed, roasted, or grilled with a minimal amount of fat.   Eat Less Often/Avoid: Creamed or fried vegetables. Vegetables in a cheese sauce.  Fruit (4 to 5 servings daily)   Eat More Often: All fresh, canned (in natural juice), or frozen fruits. Dried fruits without added sugar. One hundred percent fruit juice ( cup [237 mL] daily).   Eat Less Often: Dried fruits with added sugar. Canned fruit in light or heavy syrup.   Lean Meats, Fish, and Poultry (2 servings or less daily. One serving is 3 to 4 oz [85-114 g]).   Eat More Often: Ninety percent or leaner ground beef, tenderloin, sirloin. Round cuts of beef, chicken breast, turkey breast. All fish. Grill, bake, or broil your meat. Nothing should be fried.   Eat Less Often/Avoid: Fatty cuts of meat, turkey, or chicken leg, thigh, or wing. Fried cuts of meat or fish.  Dairy (2 to 3 servings)   Eat More Often: Low-fat or fat-free milk, low-fat plain or light yogurt, reduced-fat or part-skim cheese.   Eat Less Often/Avoid: Milk (whole, 2%).Whole milk yogurt. Full-fat cheeses.  Nuts, Seeds, and Legumes (4 to 5 servings per week)   Eat More Often: All without added salt.   Eat Less Often/Avoid: Salted nuts and seeds, canned beans with added salt.  Fats and Sweets (limited)   Eat More Often: Vegetable oils, tub margarines without trans fats, sugar-free gelatin. Mayonnaise and salad dressings.   Eat Less Often/Avoid: Coconut oils, palm oils, butter, stick margarine, cream, half and half, cookies, candy, pie.  FOR MORE INFORMATION  The Dash Diet Eating Plan: www.dashdiet.org  Document Released: 09/22/2011 Document Revised: 12/26/2011 Document Reviewed: 09/22/2011  Exit[employer] Patient Information 2014 [employer].

## 2014-02-21 NOTE — Telephone Encounter (Signed)
Breast center left message on 02/20/14

## 2014-02-21 NOTE — ED Notes (Signed)
She states she has had "some weird headaches for the past week or so".  She also cites hx of preeclampsia and hypertension some 7 years ago.  She states she was briefly on antihypertensives then; but is on no meds now.  She states she had undergone "extensive tests to see where the hypertension came from, but all the tests were ok".  She states she also saw nephrology, and was told her "kidneys are normal".  She is in no distress and is alert and oriented x 4 with clear speech.  She became concerned when she checked her b/p with her machine at home and found it to be elevated.

## 2014-02-21 NOTE — Telephone Encounter (Signed)
Continue to check daily and make appoint with primary care for possible medication.  Let them know you have stopped combination birth control pills and are now on progestin only pill.

## 2014-02-21 NOTE — Telephone Encounter (Signed)
Pt informed with the below note. 

## 2014-02-25 NOTE — Telephone Encounter (Signed)
Appointment on 03/04/14

## 2014-03-04 ENCOUNTER — Ambulatory Visit
Admission: RE | Admit: 2014-03-04 | Discharge: 2014-03-04 | Disposition: A | Discharge status: Home / Self Care | Payer: Medicaid Other | Source: Ambulatory Visit | Attending: Women's Health | Admitting: Women's Health

## 2014-03-04 DIAGNOSIS — N644 Mastodynia: Secondary | ICD-10-CM

## 2014-03-26 ENCOUNTER — Telehealth: Payer: Self-pay

## 2014-03-26 NOTE — Telephone Encounter (Signed)
Left message to call.

## 2014-03-26 NOTE — Telephone Encounter (Signed)
Not really, could try to place nuva ring today to see if if will postpone cycle. Best wishes

## 2014-03-26 NOTE — Telephone Encounter (Signed)
To be married this coming Friday and honeymoon next week. D/C her OC a mos ago.  Bled for about a week right after stopping and nothing since.  Now she feels like she is going to start any minute. She wants to know if anything she can take to prevent menses from happening for her wedding and honeymoon.

## 2014-03-26 NOTE — Telephone Encounter (Signed)
Patient called. Informed. 

## 2014-04-30 ENCOUNTER — Encounter: Payer: Self-pay | Admitting: Internal Medicine

## 2014-04-30 ENCOUNTER — Ambulatory Visit (INDEPENDENT_AMBULATORY_CARE_PROVIDER_SITE_OTHER): Payer: Medicaid Other | Admitting: Internal Medicine

## 2014-04-30 VITALS — BP 110/80 | HR 84 | Temp 98.2°F | Resp 16 | Ht 65.0 in | Wt 163.2 lb

## 2014-04-30 DIAGNOSIS — F988 Other specified behavioral and emotional disorders with onset usually occurring in childhood and adolescence: Secondary | ICD-10-CM

## 2014-04-30 DIAGNOSIS — F419 Anxiety disorder, unspecified: Secondary | ICD-10-CM

## 2014-04-30 DIAGNOSIS — F411 Generalized anxiety disorder: Secondary | ICD-10-CM

## 2014-04-30 MED ORDER — AMPHETAMINE-DEXTROAMPHETAMINE 30 MG PO TABS: 30.0000 mg | ORAL_TABLET | Freq: Two times a day (BID) | ORAL | Status: DC

## 2014-04-30 NOTE — Progress Notes (Signed)
F/u Patient Active Problem List   Diagnosis Date Noted  . ADD (attention deficit disorder) 05/09/2012    Priority: Medium---meds still working well  . Anxiety 06/09/2011    Priority: Medium---needs occas xanax/getting better with more stable life  . 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 BMI 29.0-29.9,adult 11/30/2011  . GDM (gestational diabetes mellitus) 06/09/2011  . Pre-eclampsia 06/09/2011   Prior to Admission medications   Medication Sig Start Date End Date Taking? Authorizing Provider  ALPRAZolam Prudy Feeler(XANAX) 1 MG tablet Take 1 mg by mouth 2 (two) times daily as needed for anxiety.   Yes Historical Provider, MD  amphetamine-dextroamphetamine (ADDERALL) 30 MG tablet Take 30 mg by mouth 2 (two) times daily.   Yes Historical Provider, MD  hydrochlorothiazide (HYDRODIURIL) 12.5 MG tablet Take 2 tablets (25 mg total) by mouth daily. 02/21/14 Off---see er visit HTN--episod elev since late teeens w/out dx/rx  Fayrene HelperBowie Tran, PA-C  norethindrone (ORTHO MICRONOR) 0.35 MG tablet Take 1 tablet (0.35 mg total) by mouth daily. 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 amphetamine-dextroamphetamine (ADDERALL) 30 MG tablet    Sig: Take 1 tablet (30 mg total) by mouth 2 (two) times daily.    Dispense:  60 tablet    Refill:  0  . amphetamine-dextroamphetamine (ADDERALL) 30 MG tablet    Sig: Take 1 tablet (30 mg total) by mouth 2 (two) times daily. For 30d after signed    Dispense:  60 tablet    Refill:  0  .  amphetamine-dextroamphetamine (ADDERALL) 30 MG tablet    Sig: Take 1 tablet (30 mg total) by mouth 2 (two) times daily. For 60d after signed    Dispense:  60 tablet    Refill:  0

## 2014-06-17 ENCOUNTER — Other Ambulatory Visit: Payer: Self-pay

## 2014-06-17 NOTE — Telephone Encounter (Signed)
Pt LMOM asking for refills of xanax. Pended for review. I didn't know if you wanted to put add'l RFs on it?

## 2014-06-18 MED ORDER — ALPRAZOLAM 1 MG PO TABS: 1.0000 mg | ORAL_TABLET | Freq: Two times a day (BID) | ORAL | Status: DC | PRN

## 2014-06-18 NOTE — Telephone Encounter (Signed)
Faxed

## 2014-06-18 NOTE — Telephone Encounter (Signed)
Notified pt Rx was refilled.

## 2014-06-30 ENCOUNTER — Telehealth: Payer: Self-pay

## 2014-06-30 NOTE — Telephone Encounter (Signed)
Pt states she is unable to get rx for adderral refilled due to new insurance, states needs authorization. Per patient her pharmacy sent a request this morning in reference to this issue. Patient wants to confirm we have received the information from the pharmacy

## 2014-07-01 NOTE — Telephone Encounter (Signed)
I have not received info from [place], so called [facility] [place] 161096, ph # [phone] [facility]), ID# [id number]. Called and they will send me the correct form to complete (covermymeds would not give me the correct form). I was advised standard turnaround time is 5-7 days but can take up to 15 if more info is needed. Pt was prev Rxd 30 mg 0.5 - 1 tab BID from 05/09/12 - 11/13/12 when she was changed to current dose. Pt reported she was on the 20 mg BID before coming here and some other ADD meds between ages of 76 - 14 yrs, which she can not remember names of. Completed form and faxed in. Pending.

## 2014-07-01 NOTE — Telephone Encounter (Signed)
Spoke to pt advised her this PA would take a few days- gave Britta Mccreedy the information to keep an eye out for the info from the pharmacy.

## 2014-07-03 ENCOUNTER — Telehealth: Payer: Self-pay

## 2014-07-03 NOTE — Telephone Encounter (Signed)
Patient calling to check status of her "Adderral" prescription being approved. I informed patient last note on her account stated we were still waiting on approval and per patients insurance could take 5-7 days up to 15. Patient concern because she has not taken it now for 4 days. Patient request someone to please call her at [phone]

## 2014-07-04 NOTE — Telephone Encounter (Signed)
Spoke to pt advised her we are still waiting and will contact her as soon as we hear the results.

## 2014-07-08 ENCOUNTER — Telehealth: Payer: Self-pay

## 2014-07-08 NOTE — Telephone Encounter (Signed)
Pt called in wanting to know the status on her prescription authorization through her insurance company?

## 2014-07-11 NOTE — Telephone Encounter (Signed)
PA approved see prev mes.

## 2014-07-11 NOTE — Telephone Encounter (Signed)
PA approved. See 06/30/14 message.

## 2014-07-11 NOTE — Telephone Encounter (Signed)
Called pharm to check if Rx has/will go through yet. Pharm advised they got a paid claim and they will contact pt when they have ready for p/up.

## 2014-07-31 ENCOUNTER — Ambulatory Visit (INDEPENDENT_AMBULATORY_CARE_PROVIDER_SITE_OTHER): Payer: BC Managed Care – PPO | Admitting: Women's Health

## 2014-07-31 ENCOUNTER — Other Ambulatory Visit (HOSPITAL_COMMUNITY)
Admission: RE | Admit: 2014-07-31 | Discharge: 2014-07-31 | Disposition: A | Discharge status: Home / Self Care | Payer: BC Managed Care – PPO | Source: Ambulatory Visit | Attending: Women's Health | Admitting: Women's Health

## 2014-07-31 ENCOUNTER — Encounter: Payer: Self-pay | Admitting: Women's Health

## 2014-07-31 VITALS — BP 136/86 | Ht 65.0 in | Wt 173.0 lb

## 2014-07-31 DIAGNOSIS — N926 Irregular menstruation, unspecified: Secondary | ICD-10-CM

## 2014-07-31 DIAGNOSIS — Z01419 Encounter for gynecological examination (general) (routine) without abnormal findings: Secondary | ICD-10-CM | POA: Diagnosis present

## 2014-07-31 DIAGNOSIS — Z1151 Encounter for screening for human papillomavirus (HPV): Secondary | ICD-10-CM | POA: Insufficient documentation

## 2014-07-31 DIAGNOSIS — R87810 Cervical high risk human papillomavirus (HPV) DNA test positive: Secondary | ICD-10-CM | POA: Insufficient documentation

## 2014-07-31 DIAGNOSIS — Z113 Encounter for screening for infections with a predominantly sexual mode of transmission: Secondary | ICD-10-CM

## 2014-07-31 LAB — CBC WITH DIFFERENTIAL/PLATELET
BASOS ABS: 0.1 10*3/uL (ref 0.0–0.1)
Basophils Relative: 1 % (ref 0–1)
EOS ABS: 0.1 10*3/uL (ref 0.0–0.7)
EOS PCT: 1 % (ref 0–5)
HCT: 39.1 % (ref 36.0–46.0)
Hemoglobin: 13.3 g/dL (ref 12.0–15.0)
Lymphocytes Relative: 23 % (ref 12–46)
Lymphs Abs: 1.6 10*3/uL (ref 0.7–4.0)
MCH: 30.6 pg (ref 26.0–34.0)
MCHC: 34 g/dL (ref 30.0–36.0)
MCV: 90.1 fL (ref 78.0–100.0)
Monocytes Absolute: 0.6 10*3/uL (ref 0.1–1.0)
Monocytes Relative: 8 % (ref 3–12)
Neutro Abs: 4.8 10*3/uL (ref 1.7–7.7)
Neutrophils Relative %: 67 % (ref 43–77)
PLATELETS: 285 10*3/uL (ref 150–400)
RBC: 4.34 MIL/uL (ref 3.87–5.11)
RDW: 13.6 % (ref 11.5–15.5)
WBC: 7.1 10*3/uL (ref 4.0–10.5)

## 2014-07-31 LAB — COMPREHENSIVE METABOLIC PANEL
ALT: 24 U/L (ref 0–35)
AST: 20 U/L (ref 0–37)
Albumin: 4.3 g/dL (ref 3.5–5.2)
Alkaline Phosphatase: 69 U/L (ref 39–117)
BILIRUBIN TOTAL: 0.3 mg/dL (ref 0.2–1.2)
BUN: 11 mg/dL (ref 6–23)
CO2: 24 mEq/L (ref 19–32)
CREATININE: 0.76 mg/dL (ref 0.50–1.10)
Calcium: 9.6 mg/dL (ref 8.4–10.5)
Chloride: 101 mEq/L (ref 96–112)
Glucose, Bld: 104 mg/dL — ABNORMAL HIGH (ref 70–99)
Potassium: 4.8 mEq/L (ref 3.5–5.3)
Sodium: 137 mEq/L (ref 135–145)
Total Protein: 7.1 g/dL (ref 6.0–8.3)

## 2014-07-31 LAB — TSH: TSH: 1.146 u[IU]/mL (ref 0.350–4.500)

## 2014-07-31 LAB — HCG, QUANTITATIVE, PREGNANCY

## 2014-07-31 NOTE — Addendum Note (Signed)
Addended by: Dayna BarkerGARDNER, KIMBERLY K on: 07/31/2014 12:32 PM   Modules accepted: Orders

## 2014-07-31 NOTE — Patient Instructions (Signed)

## 2014-07-31 NOTE — Progress Notes (Signed)
Gabrielle BoydenMaria L Zavala 1986/01/10 [id number]    History:    Presents for annual exam.  Regular monthly cycle until September, contraception- withdrawal, 2 days of light spotting in September. Pregnancy  okay. Has had some elevated blood pressures in the past on Sprintec, had used Micronor but stopped. 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 History of gestational diabetes. Mother breast cancer 6360. ADD/anxiety-primary care manages.  Past medical history, past surgical history, family history and social history were all reviewed and documented in the EPIC chart. 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 [place] 8 doing well.   ROS:  A  12 point ROS was performed and pertinent positives and negatives are included.  Exam:  Filed Vitals:   07/31/14 1053  BP: 136/86    General appearance:  Normal Thyroid:  Symmetrical, normal in size, without palpable masses or nodularity. Respiratory  Auscultation:  Clear without wheezing or rhonchi Cardiovascular  Auscultation:  Regular rate, without rubs, murmurs or gallops  Edema/varicosities:  Not grossly evident Abdominal  Soft,nontender, without masses, guarding or rebound.  Liver/spleen:  No organomegaly noted  Hernia:  None appreciated  Skin  Inspection:  Grossly normal   Breasts: Examined lying and sitting.     Right: Without masses, retractions, discharge or axillary adenopathy.     Left: Without masses, retractions, discharge or axillary adenopathy. Gentitourinary   Inguinal/mons:  Normal without inguinal adenopathy  External genitalia:  Normal  BUS/Urethra/Skene's glands:  Normal  Vagina:  Normal  Cervix:  Normal  Uterus:   normal in size, shape and contour.  Midline and mobile  Adnexa/parametria:     Rt: Without masses or tenderness.   Lt: Without masses or tenderness.  Anus and perineum: Normal  Digital rectal exam: Normal sphincter tone without palpated masses or tenderness  Assessment/Plan:  28 y.o. MWF G1P1 for annual  exam.    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 Same partner/requests  STD check. Declines gardasil. SBE's, regular exercise, calcium rich diet, MVI daily encouraged. Reviewed importance of increasing exercise and decreasing calories for weight loss. If pregnancy positive, prenatal care elsewhere aware we no longer deliver. Declines other contraception, pregnant okay. Continue to watch blood pressure if greater than 130/80 followup with primary care, had been on medication for one month 5 months ago, primary care chose not to continue medication.  Harrington ChallengerYOUNG,Jovanni Rash J Chester County HospitalWHNP, 11:54 AM 07/31/2014

## 2014-08-01 LAB — URINALYSIS W MICROSCOPIC + REFLEX CULTURE
Bacteria, UA: NONE SEEN
Bilirubin Urine: NEGATIVE
CASTS: NONE SEEN
Crystals: NONE SEEN
Glucose, UA: NEGATIVE mg/dL
HGB URINE DIPSTICK: NEGATIVE
Ketones, ur: NEGATIVE mg/dL
LEUKOCYTES UA: NEGATIVE
Nitrite: NEGATIVE
PH: 7.5 (ref 5.0–8.0)
Protein, ur: NEGATIVE mg/dL
SQUAMOUS EPITHELIAL / LPF: NONE SEEN
Specific Gravity, Urine: 1.01 (ref 1.005–1.030)
Urobilinogen, UA: 0.2 mg/dL (ref 0.0–1.0)

## 2014-08-01 LAB — SYPHILIS: RPR W/REFLEX TO RPR TITER AND TREPONEMAL ANTIBODIES, TRADITIONAL SCREENING AND DIAGNOSIS ALGORITHM

## 2014-08-01 LAB — PROLACTIN: Prolactin: 3.4 ng/mL

## 2014-08-01 LAB — HIV ANTIBODY (ROUTINE TESTING W REFLEX): HIV 1&2 Ab, 4th Generation: NONREACTIVE

## 2014-08-01 LAB — GC/CHLAMYDIA PROBE AMP
CT Probe RNA: NEGATIVE
GC Probe RNA: NEGATIVE

## 2014-08-01 LAB — HEPATITIS B SURFACE ANTIGEN: HEP B S AG: NEGATIVE

## 2014-08-01 LAB — HEPATITIS C ANTIBODY: HCV Ab: NEGATIVE

## 2014-08-03 ENCOUNTER — Encounter: Payer: Self-pay | Admitting: Women's Health

## 2014-08-05 LAB — CYTOLOGY - PAP

## 2014-08-11 ENCOUNTER — Encounter: Payer: Self-pay | Admitting: Gynecology

## 2014-08-13 ENCOUNTER — Telehealth: Payer: Self-pay

## 2014-08-13 MED ORDER — AMPHETAMINE-DEXTROAMPHETAMINE 30 MG PO TABS: 30.0000 mg | ORAL_TABLET | Freq: Two times a day (BID) | ORAL | Status: DC

## 2014-08-13 NOTE — Telephone Encounter (Signed)
LMOM letting pt know that this is ready for p/u

## 2014-08-13 NOTE — Telephone Encounter (Signed)
Patient is needing to get a refill of her adderall until she can see doolittle on 11/18.   Best#: [phone]

## 2014-08-18 ENCOUNTER — Encounter: Payer: Self-pay | Admitting: Gynecology

## 2014-09-03 ENCOUNTER — Ambulatory Visit (INDEPENDENT_AMBULATORY_CARE_PROVIDER_SITE_OTHER): Payer: BC Managed Care – PPO | Admitting: Internal Medicine

## 2014-09-03 ENCOUNTER — Encounter: Payer: Self-pay | Admitting: Internal Medicine

## 2014-09-03 VITALS — BP 135/84 | HR 82 | Temp 99.4°F | Resp 16 | Ht 66.0 in | Wt 179.0 lb

## 2014-09-03 DIAGNOSIS — L309 Dermatitis, unspecified: Secondary | ICD-10-CM

## 2014-09-03 DIAGNOSIS — F419 Anxiety disorder, unspecified: Secondary | ICD-10-CM

## 2014-09-03 MED ORDER — ALPRAZOLAM 1 MG PO TABS: 1.0000 mg | ORAL_TABLET | Freq: Two times a day (BID) | ORAL | Status: DC | PRN

## 2014-09-03 MED ORDER — AMPHETAMINE-DEXTROAMPHETAMINE 30 MG PO TABS: 30.0000 mg | ORAL_TABLET | Freq: Two times a day (BID) | ORAL | Status: DC

## 2014-09-03 MED ORDER — FLUOCINONIDE 0.05 % EX CREA: 1.0000 "application " | TOPICAL_CREAM | Freq: Two times a day (BID) | CUTANEOUS | Status: DC

## 2014-09-03 MED ORDER — DOXYCYCLINE HYCLATE 100 MG PO TABS: 100.0000 mg | ORAL_TABLET | Freq: Two times a day (BID) | ORAL | Status: DC

## 2014-09-03 NOTE — Progress Notes (Signed)
   Subjective:    Patient ID: Gabrielle Zavala, female    DOB: [birth year], 28 y.o.   MRN: [id number]  HPI #1 follow-up for attention deficit disorder Doing well with medications without side effects  #2 skin rash on hands and right foot very pruritic/worse after washing hands frequently at work  #3 piercing nodes now with redness and pus around it. Present 2 years but hasn't been removed. This is tender    Review of Systems Noncontributory    Objective:   Physical Exam BP 135/84 mmHg  Pulse 82  Temp(Src) 99.4 F (37.4 C)  Resp 16  Ht 5\' 6"  (1.676 m)  Wt 179 lb (81.194 kg)  BMI 28.91 kg/m2  SpO2 98%  LMP 05/30/2014 HEENT clear except for nasal exam Stainless steel ball piercing right nostril with anterior portion curved and rotated and embedded in the lateral wall of the nasal chamber///pustule with expressible pus above the piercing location on the outer nose Attempted to free the embedded portion with forceps and was only partially able to do so. 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

## 2014-10-27 ENCOUNTER — Telehealth: Payer: Self-pay | Admitting: Family Medicine

## 2014-10-27 NOTE — Telephone Encounter (Signed)
LMOM  Of old appt on 03/04/15 is cancelled new appt is 02/25/15 at 10:30

## 2014-12-31 ENCOUNTER — Telehealth: Payer: Self-pay

## 2014-12-31 NOTE — Telephone Encounter (Signed)
Pt in need of her ADDERALL 30mg s. Please call [phone] and pt have already made an appt to see Dr. Merla Richesoolittle

## 2015-01-01 MED ORDER — AMPHETAMINE-DEXTROAMPHETAMINE 30 MG PO TABS: 30.0000 mg | ORAL_TABLET | Freq: Two times a day (BID) | ORAL | Status: DC

## 2015-01-01 NOTE — Telephone Encounter (Signed)
Med refilled for one month. Waiting for her at office. Refilled this way since Dr Merla Richesoolittle is out of town, but she will need to keep her appt in April and f/u with him in person for any further appts.

## 2015-01-01 NOTE — Telephone Encounter (Signed)
appt sch for 01/28/15.

## 2015-01-02 NOTE — Telephone Encounter (Signed)
Notified pt ready and advised her to keep appt in Apr for add'l RFs. Pt agreed.

## 2015-01-28 ENCOUNTER — Encounter: Payer: Self-pay | Admitting: Internal Medicine

## 2015-01-28 ENCOUNTER — Ambulatory Visit (INDEPENDENT_AMBULATORY_CARE_PROVIDER_SITE_OTHER): Payer: BLUE CROSS/BLUE SHIELD | Admitting: Internal Medicine

## 2015-01-28 VITALS — BP 137/93 | HR 92 | Temp 98.2°F | Resp 16 | Ht 65.0 in | Wt 172.0 lb

## 2015-01-28 DIAGNOSIS — F988 Other specified behavioral and emotional disorders with onset usually occurring in childhood and adolescence: Secondary | ICD-10-CM

## 2015-01-28 DIAGNOSIS — R21 Rash and other nonspecific skin eruption: Secondary | ICD-10-CM

## 2015-01-28 DIAGNOSIS — Z6828 Body mass index (BMI) 28.0-28.9, adult: Secondary | ICD-10-CM | POA: Diagnosis not present

## 2015-01-28 DIAGNOSIS — F909 Attention-deficit hyperactivity disorder, unspecified type: Secondary | ICD-10-CM

## 2015-01-28 DIAGNOSIS — F419 Anxiety disorder, unspecified: Secondary | ICD-10-CM | POA: Diagnosis not present

## 2015-01-28 LAB — POCT SKIN KOH: Skin KOH, POC: NEGATIVE

## 2015-01-28 MED ORDER — BETAMETHASONE VALERATE 0.1 % EX OINT: 1.0000 "application " | TOPICAL_OINTMENT | Freq: Two times a day (BID) | CUTANEOUS | Status: DC

## 2015-01-28 MED ORDER — AMPHETAMINE-DEXTROAMPHETAMINE 30 MG PO TABS: 30.0000 mg | ORAL_TABLET | Freq: Two times a day (BID) | ORAL | Status: DC

## 2015-01-28 MED ORDER — ALPRAZOLAM 1 MG PO TABS: 1.0000 mg | ORAL_TABLET | Freq: Two times a day (BID) | ORAL | Status: DC | PRN

## 2015-01-28 NOTE — Progress Notes (Addendum)
Subjective:    Patient ID: Gabrielle Zavala, female    DOB: 11-05-1985, 29 y.o.   MRN: [id number]  HPI Patient presents today for follow up of ADHD and anxiety. Adderall 30 mg po BID continues to work well for her. She takes first dose around 6:30 am and second dose from 1-3 pm. Denies difficulty with sleep, decreased appetite, chest pain, palpitations or SOB. 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 She is considering going to school to become a Museum/gallery conservatorvet tech. She got married 10 months ago and things are going well.  She continues to have nearly daily anxiety and stress. This is better than it was in the past. She takes Xanax prior to work and at night for sleep. She has rare panic attacks and can not identify any particular triggers.  She continues to have rash on right ankle. She used Lidex twice a day for several weeks without relief. She would really like this cleared up prior to the summer. Has smaller area on right palm. Washes her hands frequently and uses hand sanitizer often. Has not tried Lidex on area on her palm.   She has noticed intermittent red rash across nose for several months. She does not want any medication for this. She had an infected right nare piercing at her last visit that Dr. Merla Richesoolittle could not remove. Symptoms improved and she still has the piercing.   Has had elevated blood pressure readings for as long as she can remember. She has a blood pressure cuff at home, but has not taken her blood pressure recently.   She has been off OCPs since 5/15. Her periods are irregular. She and her husband of 10 months are not actively trying to conceive, but are not using any birth control. She is not taking a prenatal vitamin. She sees her gyn regularly. She has an 418 yo son. She stopped her Adderall and Xanax while pregnant.   Review of Systems No chest pain, no SOB, no palpitations.     Objective:   Physical Exam  Constitutional: She is oriented to person, place, and time.  She appears well-developed and well-nourished.  HENT:  Head: Normocephalic and atraumatic.  Right nare piercing without erythema, drainage.   Eyes: Conjunctivae are normal.  Neck: Normal range of motion. Neck supple.  Cardiovascular: Normal rate, regular rhythm and normal heart sounds.   Pulmonary/Chest: Effort normal and breath sounds normal.  Musculoskeletal: Normal range of motion.  Neurological: She is alert and oriented to person, place, and time.  Skin: Skin is warm and dry.  Right lateral ankle with 2 cm round area dry, flaking, cracked skin.  Right palm with 5 mm area dry, cracked skin.    Psychiatric: She has a normal mood and affect. Her behavior is normal. Judgment and thought content normal.  Vitals reviewed. BP 137/93 mmHg  Pulse 92  Temp(Src) 98.2 F (36.8 C)  Resp 16  Ht 5\' 5"  (1.651 m)  Wt 172 lb (78.019 kg)  BMI 28.62 kg/m2  SpO2 100% Recheck BP 142/88  KOH skin scraping of ankle- negative    Assessment & Plan:  1. Anxiety - ALPRAZolam (XANAX) 1 MG tablet; Take 1 tablet (1 mg total) by mouth 2 (two) times daily as needed for anxiety.  Dispense: 60 tablet; Refill: 5  2. ADD (attention deficit disorder) - amphetamine-dextroamphetamine (ADDERALL) 30 MG tablet; Take 1 tablet by mouth 2 (two) times daily. For 30d after signed  Dispense: 60 tablet; Refill:  0 - amphetamine-dextroamphetamine (ADDERALL) 30 MG tablet; Take 1 tablet by mouth 2 (two) times daily. For 60d after signed  Dispense: 60 tablet; Refill: 0 - amphetamine-dextroamphetamine (ADDERALL) 30 MG tablet; Take 1 tablet by mouth 2 (two) times daily.  Dispense: 60 tablet; Refill: 0  3. Rash and nonspecific skin eruption - POCT Skin KOH - betamethasone valerate ointment (VALISONE) 0.1 %; Apply 1 application topically 2 (two) times daily.  Dispense: 30 g; Refill: 0  4. BMI 28.0-28.9,adult - encouraged continued, gradual weigh loss  5. Elevated BP - encouraged her to keep bp log and bring to next  visit.  - discussed need for prenatal vitamin if patient is not actively preventing pregnancy and discussed need to discontinue her adderall and xanax if she becomes pregnant.    Emi Belfast, FNP-BC  [facility], [facility]  01/29/2015 10:50 PM I have participated in the care of this patient with the Advanced Practice Provider and agree with Diagnosis and Plan as documented. Robert P. Merla Riches, M.D.

## 2015-01-29 DIAGNOSIS — Z6828 Body mass index (BMI) 28.0-28.9, adult: Secondary | ICD-10-CM | POA: Insufficient documentation

## 2015-02-25 ENCOUNTER — Ambulatory Visit: Payer: Self-pay | Admitting: Internal Medicine

## 2015-03-04 ENCOUNTER — Ambulatory Visit: Payer: BC Managed Care – PPO | Admitting: Internal Medicine

## 2015-04-13 ENCOUNTER — Other Ambulatory Visit: Payer: Self-pay

## 2015-08-04 ENCOUNTER — Encounter: Payer: BLUE CROSS/BLUE SHIELD | Admitting: Women's Health

## 2015-08-05 ENCOUNTER — Ambulatory Visit: Payer: BLUE CROSS/BLUE SHIELD | Admitting: Internal Medicine

## 2015-08-14 ENCOUNTER — Encounter: Payer: Self-pay | Admitting: Internal Medicine

## 2015-08-14 ENCOUNTER — Ambulatory Visit (INDEPENDENT_AMBULATORY_CARE_PROVIDER_SITE_OTHER): Payer: 59 | Admitting: Internal Medicine

## 2015-08-14 VITALS — BP 123/78 | HR 93 | Temp 97.6°F | Resp 16 | Ht 66.0 in | Wt 190.8 lb

## 2015-08-14 DIAGNOSIS — F419 Anxiety disorder, unspecified: Secondary | ICD-10-CM

## 2015-08-14 MED ORDER — ALPRAZOLAM 1 MG PO TABS: 1.0000 mg | ORAL_TABLET | Freq: Two times a day (BID) | ORAL | Status: DC | PRN

## 2015-08-14 NOTE — Patient Instructions (Addendum)
[email or web address]   ADD for dummies Overcoming anxiety for Dummies

## 2015-08-14 NOTE — Progress Notes (Signed)
   Subjective:    Patient ID: Gabrielle Zavala, female    DOB: 1986/08/07, 29 y.o.   MRN: [id number]  HPIf/u Patient Active Problem List   Diagnosis Date Noted  . ADD (attention deficit disorder) 05/09/2012     after many years she has decided to discontinue Adderall and see how she does in her current job. She has done well enough to be promoted and is currently happy without symptoms of anxiety or distractibility it would make her want to resume medication.   [name]. Anxiety 06/09/2011     continues to require occasional Xanax. Has never done well another medication. She feels she is getting her life organized the point that she will be able to decrease his medicine in the future   . BMI 28.0-28.9,adult 01/29/2015    Daughter continues to do well Stable relationship Job promotion Review of Systems Noncontributory    Objective:   Physical Exam BP 123/78 mmHg  Pulse 93  Temp(Src) 97.6 F (36.4 C) (Oral)  Resp 16  Ht 5\' 6"  (1.676 m)  Wt 190 lb 12.8 oz (86.546 kg)  BMI 30.81 kg/m2  LMP 08/07/2015 Mood good affect appropriate Thought content normal Judgment sound       Wt Readings from Last 3 Encounters:  08/14/15 190 lb 12.8 oz (86.546 kg)  01/28/15 172 lb (78.019 kg)  09/03/14 179 lb (81.194 kg)    Assessment & Plan:  Puts off tetanus  Meds ordered this encounter  Medications  . ALPRAZolam (XANAX) 1 MG tablet    Sig: Take 1 tablet (1 mg total) by mouth 2 (two) times daily as needed for anxiety.    Dispense:  60 tablet    Refill:  5   Anxiety - Plan: ALPRAZolam (XANAX) 1 MG tablet ADD--stable off meds  F/u 3-6 mos

## 2015-08-19 ENCOUNTER — Other Ambulatory Visit (HOSPITAL_COMMUNITY)
Admission: RE | Admit: 2015-08-19 | Discharge: 2015-08-19 | Disposition: A | Discharge status: Home / Self Care | Payer: 59 | Source: Ambulatory Visit | Attending: Gynecology | Admitting: Gynecology

## 2015-08-19 ENCOUNTER — Ambulatory Visit (INDEPENDENT_AMBULATORY_CARE_PROVIDER_SITE_OTHER): Payer: 59 | Admitting: Women's Health

## 2015-08-19 ENCOUNTER — Encounter: Payer: Self-pay | Admitting: Women's Health

## 2015-08-19 VITALS — BP 132/86 | Ht 66.6 in | Wt 186.6 lb

## 2015-08-19 DIAGNOSIS — Z01419 Encounter for gynecological examination (general) (routine) without abnormal findings: Secondary | ICD-10-CM | POA: Diagnosis present

## 2015-08-19 DIAGNOSIS — N926 Irregular menstruation, unspecified: Secondary | ICD-10-CM | POA: Diagnosis not present

## 2015-08-19 LAB — CBC WITH DIFFERENTIAL/PLATELET
BASOS ABS: 0 10*3/uL (ref 0.0–0.1)
Basophils Relative: 0 % (ref 0–1)
EOS ABS: 0.1 10*3/uL (ref 0.0–0.7)
Eosinophils Relative: 1 % (ref 0–5)
HCT: 40.2 % (ref 36.0–46.0)
HEMOGLOBIN: 13.6 g/dL (ref 12.0–15.0)
Lymphocytes Relative: 22 % (ref 12–46)
Lymphs Abs: 2.1 10*3/uL (ref 0.7–4.0)
MCH: 30.2 pg (ref 26.0–34.0)
MCHC: 33.8 g/dL (ref 30.0–36.0)
MCV: 89.3 fL (ref 78.0–100.0)
MPV: 10.2 fL (ref 8.6–12.4)
Monocytes Absolute: 0.6 10*3/uL (ref 0.1–1.0)
Monocytes Relative: 6 % (ref 3–12)
NEUTROS ABS: 6.7 10*3/uL (ref 1.7–7.7)
NEUTROS PCT: 71 % (ref 43–77)
Platelets: 320 10*3/uL (ref 150–400)
RBC: 4.5 MIL/uL (ref 3.87–5.11)
RDW: 13.4 % (ref 11.5–15.5)
WBC: 9.5 10*3/uL (ref 4.0–10.5)

## 2015-08-19 LAB — HM PAP SMEAR

## 2015-08-19 LAB — GLUCOSE, RANDOM: GLUCOSE: 103 mg/dL — AB (ref 65–99)

## 2015-08-19 LAB — PREGNANCY, URINE: Preg Test, Ur: POSITIVE — AB

## 2015-08-19 NOTE — Patient Instructions (Signed)
Diabetes Mellitus and Food It is important for you to manage your blood sugar (glucose) level. Your blood glucose level can be greatly affected by what you eat. Eating healthier foods in the appropriate amounts throughout the day at about the same time each day will help you control your blood glucose level. It can also help slow or prevent worsening of your diabetes mellitus. Healthy eating may even help you improve the level of your blood pressure and reach or maintain a healthy weight.  General recommendations for healthful eating and cooking habits include:  Eating meals and snacks regularly. Avoid going long periods of time without eating to lose weight.  Eating a diet that consists mainly of plant-based foods, such as fruits, vegetables, nuts, legumes, and whole grains.  Using low-heat cooking methods, such as baking, instead of high-heat cooking methods, such as deep frying. Work with your dietitian to make sure you understand how to use the Nutrition Facts information on food labels. HOW CAN FOOD AFFECT ME? Carbohydrates Carbohydrates affect your blood glucose level more than any other type of food. Your dietitian will help you determine how many carbohydrates to eat at each meal and teach you how to count carbohydrates. Counting carbohydrates is important to keep your blood glucose at a healthy level, especially if you are using insulin or taking certain medicines for diabetes mellitus. Alcohol Alcohol can cause sudden decreases in blood glucose (hypoglycemia), especially if you use insulin or take certain medicines for diabetes mellitus. Hypoglycemia can be a life-threatening condition. Symptoms of hypoglycemia (sleepiness, dizziness, and disorientation) are similar to symptoms of having too much alcohol.  If your health care provider has given you approval to drink alcohol, do so in moderation and use the following guidelines:  Women should not have more than one drink per day, and men  should not have more than two drinks per day. One drink is equal to:  12 oz of beer.  5 oz of wine.  1 oz of hard liquor.  Do not drink on an empty stomach.  Keep yourself hydrated. Have water, diet soda, or unsweetened iced tea.  Regular soda, juice, and other mixers might contain a lot of carbohydrates and should be counted. WHAT FOODS ARE NOT RECOMMENDED? As you make food choices, it is important to remember that all foods are not the same. Some foods have fewer nutrients per serving than other foods, even though they might have the same number of calories or carbohydrates. It is difficult to get your body what it needs when you eat foods with fewer nutrients. Examples of foods that you should avoid that are high in calories and carbohydrates but low in nutrients include:  Trans fats (most processed foods list trans fats on the Nutrition Facts label).  Regular soda.  Juice.  Candy.  Sweets, such as cake, pie, doughnuts, and cookies.  Fried foods. WHAT FOODS CAN I EAT? Eat nutrient-rich foods, which will nourish your body and keep you healthy. The food you should eat also will depend on several factors, including:  The calories you need.  The medicines you take.  Your weight.  Your blood glucose level.  Your blood pressure level.  Your cholesterol level. You should eat a variety of foods, including:  Protein.  Lean cuts of meat.  Proteins low in saturated fats, such as fish, egg whites, and beans. Avoid processed meats.  Fruits and vegetables.  Fruits and vegetables that may help control blood glucose levels, such as apples, mangoes, and  yams.  Dairy products.  Choose fat-free or low-fat dairy products, such as milk, yogurt, and cheese.  Grains, bread, pasta, and rice.  Choose whole grain products, such as multigrain bread, whole oats, and brown rice. These foods may help control blood pressure.  Fats.  Foods containing healthful fats, such as nuts,  avocado, olive oil, canola oil, and fish. DOES EVERYONE WITH DIABETES MELLITUS HAVE THE SAME MEAL PLAN? Because every person with diabetes mellitus is different, there is not one meal plan that works for everyone. It is very important that you meet with a dietitian who will help you create a meal plan that is just right for you.   This information is not intended to replace advice given to you by your health care provider. Make sure you discuss any questions you have with your health care provider.   Document Released: 06/30/2005 Document Revised: 10/24/2014 Document Reviewed: 08/30/2013 Elsevier Interactive Patient Education 2016 [employer] Health Maintenance, Female Adopting a healthy lifestyle and getting preventive care can go a long way to promote health and wellness. Talk with your health care provider about what schedule of regular examinations is right for you. This is a good chance for you to check in with your provider about disease prevention and staying healthy. In between checkups, there are plenty of things you can do on your own. Experts have done a lot of research about which lifestyle changes and preventive measures are most likely to keep you healthy. Ask your health care provider for more information. WEIGHT AND DIET  Eat a healthy diet  Be sure to include plenty of vegetables, fruits, low-fat dairy products, and lean protein.  Do not eat a lot of foods high in solid fats, added sugars, or salt.  Get regular exercise. This is one of the most important things you can do for your health.  Most adults should exercise for at least 150 minutes each week. The exercise should increase your heart rate and make you sweat (moderate-intensity exercise).  Most adults should also do strengthening exercises at least twice a week. This is in addition to the moderate-intensity exercise.  Maintain a healthy weight  Body mass index (BMI) is a measurement that can be used to identify  possible weight problems. It estimates body fat based on height and weight. Your health care provider can help determine your BMI and help you achieve or maintain a healthy weight.  For females 20 years of age and older:   A BMI below 18.5 is considered underweight.  A BMI of 18.5 to 24.9 is normal.  A BMI of 25 to 29.9 is considered overweight.  A BMI of 30 and above is considered obese.  Watch levels of cholesterol and blood lipids  You should start having your blood tested for lipids and cholesterol at 27 years of age, then have this test every 5 years.  You may need to have your cholesterol levels checked more often if:  Your lipid or cholesterol levels are high.  You are older than 27 years of age.  You are at high risk for heart disease.  CANCER SCREENING   Lung Cancer  Lung cancer screening is recommended for adults 55-80 years old who are at high risk for lung cancer because of a history of smoking.  A yearly low-dose CT scan of the lungs is recommended for people who:  Currently smoke.  Have quit within the past 15 years.  Have at least a 30-pack-year history of smoking. A   pack year is smoking an average of one pack of cigarettes a day for 1 year.  Yearly screening should continue until it has been 15 years since you quit.  Yearly screening should stop if you develop a health problem that would prevent you from having lung cancer treatment.  Breast Cancer  Practice breast self-awareness. This means understanding how your breasts normally appear and feel.  It also means doing regular breast self-exams. Let your health care provider know about any changes, no matter how small.  If you are in your 20s or 30s, you should have a clinical breast exam (CBE) by a health care provider every 1-3 years as part of a regular health exam.  If you are 28 or older, have a CBE every year. Also consider having a breast X-ray (mammogram) every year.  If you have a family  history of breast cancer, talk to your health care provider about genetic screening.  If you are at high risk for breast cancer, talk to your health care provider about having an MRI and a mammogram every year.  Breast cancer gene (BRCA) assessment is recommended for women who have family members with BRCA-related cancers. BRCA-related cancers include:  Breast.  Ovarian.  Tubal.  Peritoneal cancers.  Results of the assessment will determine the need for genetic counseling and BRCA1 and BRCA2 testing. Cervical Cancer Your health care provider may recommend that you be screened regularly for cancer of the pelvic organs (ovaries, uterus, and vagina). This screening involves a pelvic examination, including checking for microscopic changes to the surface of your cervix (Pap test). You may be encouraged to have this screening done every 3 years, beginning at age 2.  For women ages 88-65, health care providers may recommend pelvic exams and Pap testing every 3 years, or they may recommend the Pap and pelvic exam, combined with testing for human papilloma virus (HPV), every 5 years. Some types of HPV increase your risk of cervical cancer. Testing for HPV may also be done on women of any age with unclear Pap test results.  Other health care providers may not recommend any screening for nonpregnant women who are considered low risk for pelvic cancer and who do not have symptoms. Ask your health care provider if a screening pelvic exam is right for you.  If you have had past treatment for cervical cancer or a condition that could lead to cancer, you need Pap tests and screening for cancer for at least 20 years after your treatment. If Pap tests have been discontinued, your risk factors (such as having a new sexual partner) need to be reassessed to determine if screening should resume. Some women have medical problems that increase the chance of getting cervical cancer. In these cases, your health care  provider may recommend more frequent screening and Pap tests. Colorectal Cancer  This type of cancer can be detected and often prevented.  Routine colorectal cancer screening usually begins at 27 years of age and continues through 27 years of age.  Your health care provider may recommend screening at an earlier age if you have risk factors for colon cancer.  Your health care provider may also recommend using home test kits to check for hidden blood in the stool.  A small camera at the end of a tube can be used to examine your colon directly (sigmoidoscopy or colonoscopy). This is done to check for the earliest forms of colorectal cancer.  Routine screening usually begins at age 33.  Direct  examination of the colon should be repeated every 5-10 years through 27 years of age. However, you may need to be screened more often if early forms of precancerous polyps or small growths are found. Skin Cancer  Check your skin from head to toe regularly.  Tell your health care provider about any new moles or changes in moles, especially if there is a change in a mole's shape or color.  Also tell your health care provider if you have a mole that is larger than the size of a pencil eraser.  Always use sunscreen. Apply sunscreen liberally and repeatedly throughout the day.  Protect yourself by wearing long sleeves, pants, a wide-brimmed hat, and sunglasses whenever you are outside. HEART DISEASE, DIABETES, AND HIGH BLOOD PRESSURE   High blood pressure causes heart disease and increases the risk of stroke. High blood pressure is more likely to develop in:  People who have blood pressure in the high end of the normal range (130-139/85-89 mm Hg).  People who are overweight or obese.  People who are African American.  If you are 2-50 years of age, have your blood pressure checked every 3-5 years. If you are 61 years of age or older, have your blood pressure checked every year. You should have your  blood pressure measured twice--once when you are at a hospital or clinic, and once when you are not at a hospital or clinic. Record the average of the two measurements. To check your blood pressure when you are not at a hospital or clinic, you can use:  An automated blood pressure machine at a pharmacy.  A home blood pressure monitor.  If you are between 64 years and 25 years old, ask your health care provider if you should take aspirin to prevent strokes.  Have regular diabetes screenings. This involves taking a blood sample to check your fasting blood sugar level.  If you are at a normal weight and have a low risk for diabetes, have this test once every three years after 27 years of age.  If you are overweight and have a high risk for diabetes, consider being tested at a younger age or more often. PREVENTING INFECTION  Hepatitis B  If you have a higher risk for hepatitis B, you should be screened for this virus. You are considered at high risk for hepatitis B if:  You were born in a country where hepatitis B is common. Ask your health care provider which countries are considered high risk.  Your parents were born in a high-risk country, and you have not been immunized against hepatitis B (hepatitis B vaccine).  You have HIV or AIDS.  You use needles to inject street drugs.  You live with someone who has hepatitis B.  You have had sex with someone who has hepatitis B.  You get hemodialysis treatment.  You take certain medicines for conditions, including cancer, organ transplantation, and autoimmune conditions. Hepatitis C  Blood testing is recommended for:  Everyone born from [birth year] through 1963.  Anyone with known risk factors for hepatitis C. Sexually transmitted infections (STIs)  You should be screened for sexually transmitted infections (STIs) including gonorrhea and chlamydia if:  You are sexually active and are younger than 26 years of age.  You are older than 27  years of age and your health care provider tells you that you are at risk for this type of infection.  Your sexual activity has changed since you were last screened and you are at  an increased risk for chlamydia or gonorrhea. Ask your health care provider if you are at risk.  If you do not have HIV, but are at risk, it may be recommended that you take a prescription medicine daily to prevent HIV infection. This is called pre-exposure prophylaxis (PrEP). You are considered at risk if:  You are sexually active and do not regularly use condoms or know the HIV status of your partner(s).  You take drugs by injection.  You are sexually active with a partner who has HIV. Talk with your health care provider about whether you are at high risk of being infected with HIV. If you choose to begin PrEP, you should first be tested for HIV. You should then be tested every 3 months for as long as you are taking PrEP.  PREGNANCY   If you are premenopausal and you may become pregnant, ask your health care provider about preconception counseling.  If you may become pregnant, take 400 to 800 micrograms (mcg) of folic acid every day.  If you want to prevent pregnancy, talk to your health care provider about birth control (contraception). OSTEOPOROSIS AND MENOPAUSE   Osteoporosis is a disease in which the bones lose minerals and strength with aging. This can result in serious bone fractures. Your risk for osteoporosis can be identified using a bone density scan.  If you are 94 years of age or older, or if you are at risk for osteoporosis and fractures, ask your health care provider if you should be screened.  Ask your health care provider whether you should take a calcium or vitamin D supplement to lower your risk for osteoporosis.  Menopause may have certain physical symptoms and risks.  Hormone replacement therapy may reduce some of these symptoms and risks. Talk to your health care provider about whether  hormone replacement therapy is right for you.  HOME CARE INSTRUCTIONS   Schedule regular health, dental, and eye exams.  Stay current with your immunizations.   Do not use any tobacco products including cigarettes, chewing tobacco, or electronic cigarettes.  If you are pregnant, do not drink alcohol.  If you are breastfeeding, limit how much and how often you drink alcohol.  Limit alcohol intake to no more than 1 drink per day for nonpregnant women. One drink equals 12 ounces of beer, 5 ounces of wine, or 1 ounces of hard liquor.  Do not use street drugs.  Do not share needles.  Ask your health care provider for help if you need support or information about quitting drugs.  Tell your health care provider if you often feel depressed.  Tell your health care provider if you have ever been abused or do not feel safe at home.   This information is not intended to replace advice given to you by your health care provider. Make sure you discuss any questions you have with your health care provider.   Document Released: 04/18/2011 Document Revised: 10/24/2014 Document Reviewed: 09/04/2013 Elsevier Interactive Patient Education [employer].

## 2015-08-19 NOTE — Progress Notes (Signed)
Fernande BoydenMaria L [place]burg September 08, 1986 [id number]    History:    Presents for annual exam. Regular monthly cycles. LMP 07/14/15, episode of light 1 day spotting on 08/04/15. No contraception for past year, trying to conceive. 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 2015 PAP normal positive high risk, negative 16, 18 and 45 . History of gestational diabetes. ADD/anxiety-primary care manages, recently discontinued Adderall. Continues to experience weight gain.    Past medical history, past surgical history, family history and social history were all reviewed and documented in the EPIC chart. Mother breast cancer 63/survivor. Recently promoted to Production designer, theatre/television/filmmanager at job. Happily married for 1.5 years. Whitney PostLogan is now 89, doing well in school.   ROS:  A ROS was performed and pertinent positives and negatives are included.  Exam:  Filed Vitals:   08/19/15 1422  BP: 132/86    General appearance:  Normal Thyroid:  Symmetrical, normal in size, without palpable masses or nodularity. Respiratory  Auscultation:  Clear without wheezing or rhonchi Cardiovascular  Auscultation:  Regular rate, without rubs, murmurs or gallops  Edema/varicosities:  Not grossly evident Abdominal  Soft,nontender, without masses, guarding or rebound.  Liver/spleen:  No organomegaly noted  Hernia:  None appreciated  Skin  Inspection:  Grossly normal   Breasts: Examined lying and sitting.     Right: Without masses, retractions, discharge or axillary adenopathy.     Left: Without masses, retractions, discharge or axillary adenopathy. Gentitourinary   Inguinal/mons:  Normal without inguinal adenopathy  External genitalia:  Normal  BUS/Urethra/Skene's glands:  Normal  Vagina:  Normal  Cervix:  Normal  Uterus:  Normal in size, shape and contour.  Midline and mobile  Adnexa/parametria:     Rt: Without masses or tenderness.   Lt: Without masses or tenderness.  Anus and perineum: Normal  Digital rectal exam: Normal  sphincter tone without palpated masses or tenderness UPT positive  Assessment/Plan:  29 y.o. MWF G2 P1 for annual exam with no complaints.  Early Pregnancy LMP 07/14/2015 2014 LGSIL  2015 normal Pap with positive HR HPV/-16, 18, 45 Borderline high blood pressure Anxiety/ADD-primary care manages  Plan: Positive UPT. Congratulated and recommended to schedule viability ultrasound for late November. Continue prenatal vitamin daily, no alcohol, safe pregnancy behaviors reviewed. Aware we no longer deliver, will schedule new OB care after viability/dating ultrasound. PAP, CBC, Glucose, UA. Continue SBEs. Recommended to consume healthy diet and daily exercise. Call if any questions or concerns.        Harrington ChallengerYOUNG,Mystie Ormand J [facility] Center And HospitalWHNP, 4:13 PM 08/19/2015

## 2015-08-20 ENCOUNTER — Telehealth: Payer: Self-pay

## 2015-08-20 LAB — PROLACTIN: PROLACTIN: 12.9 ng/mL

## 2015-08-20 LAB — TSH: TSH: 1.144 u[IU]/mL (ref 0.350–4.500)

## 2015-08-20 NOTE — Telephone Encounter (Signed)
Telephone call, reviewed to start taking half tablet daily and then decreased to quarter tablet daily and then stop. Reviewed best to do gradual but less is best. Has stop the Adderall prior to pregnancy. Reviewed plain Tylenol for pain, will try B6 and Unisom at at bedtime for nausea.

## 2015-08-20 NOTE — Telephone Encounter (Signed)
Patient said she [place] and several hours later [place] her to let her know she was pregnant.  Patient said she forgot to ask you a question. She has taken 2-3mg  of Xanax for years.  Takes it with waking.  She said she knows its not good for the baby but how to wean off of it so she does not have withdrawals.

## 2015-08-24 LAB — CYTOLOGY - PAP

## 2015-09-02 ENCOUNTER — Ambulatory Visit (INDEPENDENT_AMBULATORY_CARE_PROVIDER_SITE_OTHER): Payer: 59

## 2015-09-02 ENCOUNTER — Encounter: Payer: Self-pay | Admitting: Women's Health

## 2015-09-02 ENCOUNTER — Other Ambulatory Visit: Payer: Self-pay | Admitting: Women's Health

## 2015-09-02 ENCOUNTER — Ambulatory Visit (INDEPENDENT_AMBULATORY_CARE_PROVIDER_SITE_OTHER): Payer: 59 | Admitting: Women's Health

## 2015-09-02 VITALS — BP 142/80 | Ht 66.0 in | Wt 186.0 lb

## 2015-09-02 DIAGNOSIS — N926 Irregular menstruation, unspecified: Secondary | ICD-10-CM

## 2015-09-02 DIAGNOSIS — N912 Amenorrhea, unspecified: Secondary | ICD-10-CM

## 2015-09-02 DIAGNOSIS — O3680X1 Pregnancy with inconclusive fetal viability, fetus 1: Secondary | ICD-10-CM

## 2015-09-02 DIAGNOSIS — O3680X0 Pregnancy with inconclusive fetal viability, not applicable or unspecified: Secondary | ICD-10-CM

## 2015-09-02 NOTE — Progress Notes (Signed)
Patient ID: Fernande BoydenMaria L Genrich, female   DOB: [birth year], 29 y.o.   MRN: [id number] Presents for viability/dating ultrasound. LMP 07/14/2015, had one day spotting 10/18. Taking prenatal vitamin daily, weaning off Xanax taking fourth a tablet daily and plans to stop in the next few weeks. Has had problems with anxiety in the past. Denies vaginal bleeding, abdominal pain, discharge or urinary symptoms today.  Exam: Appears well. Happy with pregnancy. Ultrasound: T/V retroverted uterus with living IUP seen in fundus. Size equal to dates by LMP of 07/14/2015. Ultrasound CRL 7W 2-D. Fetal pole and fetal heart motion seen 150 bpm. Right ovary normal. Left ovary corpus luteal cyst 23 x 14 mm. Cervix long and closed. Negative cul-de-sac.  First trimester pregnancy  Plan: Continue prenatal vitamin daily, continue to wean and stop Xanax. Safe pregnancy behaviors reviewed/ is aware. Reviewed may need to start on antidepressant after baby is born. Has good family support. History of preeclampsia in first pregnancy. Copy of ultrasound given, instructed to take to first appointment with new OB. Congratulations given.

## 2015-09-02 NOTE — Patient Instructions (Signed)
First Trimester of Pregnancy The first trimester of pregnancy is from week 1 until the end of week 12 (months 1 through 3). A week after a sperm fertilizes an egg, the egg will implant on the wall of the uterus. This embryo will begin to develop into a baby. Genes from you and your partner are forming the baby. The female genes determine whether the baby is a boy or a girl. At 6-8 weeks, the eyes and face are formed, and the heartbeat can be seen on ultrasound. At the end of 12 weeks, all the baby's organs are formed.  Now that you are pregnant, you will want to do everything you can to have a healthy baby. Two of the most important things are to get good prenatal care and to follow your health care provider's instructions. Prenatal care is all the medical care you receive before the baby's birth. This care will help prevent, find, and treat any problems during the pregnancy and childbirth. BODY CHANGES Your body goes through many changes during pregnancy. The changes vary from woman to woman.   You may gain or lose a couple of pounds at first.  You may feel sick to your stomach (nauseous) and throw up (vomit). If the vomiting is uncontrollable, call your health care provider.  You may tire easily.  You may develop headaches that can be relieved by medicines approved by your health care provider.  You may urinate more often. Painful urination may mean you have a bladder infection.  You may develop heartburn as a result of your pregnancy.  You may develop constipation because certain hormones are causing the muscles that push waste through your intestines to slow down.  You may develop hemorrhoids or swollen, bulging veins (varicose veins).  Your breasts may begin to grow larger and become tender. Your nipples may stick out more, and the tissue that surrounds them (areola) may become darker.  Your gums may bleed and may be sensitive to brushing and flossing.  Dark spots or blotches (chloasma,  mask of pregnancy) may develop on your face. This will likely fade after the baby is born.  Your menstrual periods will stop.  You may have a loss of appetite.  You may develop cravings for certain kinds of food.  You may have changes in your emotions from day to day, such as being excited to be pregnant or being concerned that something may go wrong with the pregnancy and baby.  You may have more vivid and strange dreams.  You may have changes in your hair. These can include thickening of your hair, rapid growth, and changes in texture. Some women also have hair loss during or after pregnancy, or hair that feels dry or thin. Your hair will most likely return to normal after your baby is born. WHAT TO EXPECT AT YOUR PRENATAL VISITS During a routine prenatal visit:  You will be weighed to make sure you and the baby are growing normally.  Your blood pressure will be taken.  Your abdomen will be measured to track your baby's growth.  The fetal heartbeat will be listened to starting around week 10 or 12 of your pregnancy.  Test results from any previous visits will be discussed. Your health care provider may ask you:  How you are feeling.  If you are feeling the baby move.  If you have had any abnormal symptoms, such as leaking fluid, bleeding, severe headaches, or abdominal cramping.  If you are using any tobacco products,   including cigarettes, chewing tobacco, and electronic cigarettes.  If you have any questions. Other tests that may be performed during your first trimester include:  Blood tests to find your blood type and to check for the presence of any previous infections. They will also be used to check for low iron levels (anemia) and Rh antibodies. Later in the pregnancy, blood tests for diabetes will be done along with other tests if problems develop.  Urine tests to check for infections, diabetes, or protein in the urine.  An ultrasound to confirm the proper growth  and development of the baby.  An amniocentesis to check for possible genetic problems.  Fetal screens for spina bifida and Down syndrome.  You may need other tests to make sure you and the baby are doing well.  HIV (human immunodeficiency virus) testing. Routine prenatal testing includes screening for HIV, unless you choose not to have this test. HOME CARE INSTRUCTIONS  Medicines  Follow your health care provider's instructions regarding medicine use. Specific medicines may be either safe or unsafe to take during pregnancy.  Take your prenatal vitamins as directed.  If you develop constipation, try taking a stool softener if your health care provider approves. Diet  Eat regular, well-balanced meals. Choose a variety of foods, such as meat or vegetable-based protein, fish, milk and low-fat dairy products, vegetables, fruits, and whole grain breads and cereals. Your health care provider will help you determine the amount of weight gain that is right for you.  Avoid raw meat and uncooked cheese. These carry germs that can cause birth defects in the baby.  Eating four or five small meals rather than three large meals a day may help relieve nausea and vomiting. If you start to feel nauseous, eating a few soda crackers can be helpful. Drinking liquids between meals instead of during meals also seems to help nausea and vomiting.  If you develop constipation, eat more high-fiber foods, such as fresh vegetables or fruit and whole grains. Drink enough fluids to keep your urine clear or pale yellow. Activity and Exercise  Exercise only as directed by your health care provider. Exercising will help you:  Control your weight.  Stay in shape.  Be prepared for labor and delivery.  Experiencing pain or cramping in the lower abdomen or low back is a good sign that you should stop exercising. Check with your health care provider before continuing normal exercises.  Try to avoid standing for long  periods of time. Move your legs often if you must stand in one place for a long time.  Avoid heavy lifting.  Wear low-heeled shoes, and practice good posture.  You may continue to have sex unless your health care provider directs you otherwise. Relief of Pain or Discomfort  Wear a good support bra for breast tenderness.   Take warm sitz baths to soothe any pain or discomfort caused by hemorrhoids. Use hemorrhoid cream if your health care provider approves.   Rest with your legs elevated if you have leg cramps or low back pain.  If you develop varicose veins in your legs, wear support hose. Elevate your feet for 15 minutes, 3-4 times a day. Limit salt in your diet. Prenatal Care  Schedule your prenatal visits by the twelfth week of pregnancy. They are usually scheduled monthly at first, then more often in the last 2 months before delivery.  Write down your questions. Take them to your prenatal visits.  Keep all your prenatal visits as directed by your   health care provider. Safety  Wear your seat belt at all times when driving.  Make a list of emergency phone numbers, including numbers for family, friends, the hospital, and police and fire departments. General Tips  Ask your health care provider for a referral to a local prenatal education class. Begin classes no later than at the beginning of month 6 of your pregnancy.  Ask for help if you have counseling or nutritional needs during pregnancy. Your health care provider can offer advice or refer you to specialists for help with various needs.  Do not use hot tubs, steam rooms, or saunas.  Do not douche or use tampons or scented sanitary pads.  Do not cross your legs for long periods of time.  Avoid cat litter boxes and soil used by cats. These carry germs that can cause birth defects in the baby and possibly loss of the fetus by miscarriage or stillbirth.  Avoid all smoking, herbs, alcohol, and medicines not prescribed by  your health care provider. Chemicals in these affect the formation and growth of the baby.  Do not use any tobacco products, including cigarettes, chewing tobacco, and electronic cigarettes. If you need help quitting, ask your health care provider. You may receive counseling support and other resources to help you quit.  Schedule a dentist appointment. At home, brush your teeth with a soft toothbrush and be gentle when you floss. SEEK MEDICAL CARE IF:   You have dizziness.  You have mild pelvic cramps, pelvic pressure, or nagging pain in the abdominal area.  You have persistent nausea, vomiting, or diarrhea.  You have a bad smelling vaginal discharge.  You have pain with urination.  You notice increased swelling in your face, hands, legs, or ankles. SEEK IMMEDIATE MEDICAL CARE IF:   You have a fever.  You are leaking fluid from your vagina.  You have spotting or bleeding from your vagina.  You have severe abdominal cramping or pain.  You have rapid weight gain or loss.  You vomit blood or material that looks like coffee grounds.  You are exposed to German measles and have never had them.  You are exposed to fifth disease or chickenpox.  You develop a severe headache.  You have shortness of breath.  You have any kind of trauma, such as from a fall or a car accident.   This information is not intended to replace advice given to you by your health care provider. Make sure you discuss any questions you have with your health care provider.   Document Released: 09/27/2001 Document Revised: 10/24/2014 Document Reviewed: 08/13/2013 Elsevier Interactive Patient Education 2016 [employer]  

## 2015-09-09 ENCOUNTER — Other Ambulatory Visit: Payer: 59

## 2015-09-09 ENCOUNTER — Ambulatory Visit: Payer: 59 | Admitting: Women's Health

## 2015-09-14 ENCOUNTER — Encounter: Payer: Self-pay | Admitting: Internal Medicine

## 2015-09-29 ENCOUNTER — Other Ambulatory Visit: Payer: Self-pay | Admitting: Obstetrics and Gynecology

## 2015-09-29 LAB — OB RESULTS CONSOLE RPR: RPR: NONREACTIVE

## 2015-09-29 LAB — OB RESULTS CONSOLE HIV ANTIBODY (ROUTINE TESTING): HIV: NONREACTIVE

## 2015-09-29 LAB — OB RESULTS CONSOLE GC/CHLAMYDIA
Chlamydia: NEGATIVE
GC PROBE AMP, GENITAL: NEGATIVE

## 2015-09-29 LAB — OB RESULTS CONSOLE HEPATITIS B SURFACE ANTIGEN: Hepatitis B Surface Ag: NEGATIVE

## 2015-09-29 LAB — OB RESULTS CONSOLE RUBELLA ANTIBODY, IGM: Rubella: IMMUNE

## 2015-10-18 NOTE — L&D Delivery Note (Signed)
Patient was C/C/+2 and pushed for <15 minutes with epidural.   NSVD female infant, Apgars 9/9, weight pending.   The patient had no laceration. Fundus was firm. EBL was expected amount. Placenta was delivered intact. Vagina was clear.  Baby was vigorous and doing skin to skin with mother.  Gabrielle Zavala, Gabrielle Zavala

## 2016-02-03 ENCOUNTER — Ambulatory Visit: Payer: 59 | Admitting: Internal Medicine

## 2016-02-10 ENCOUNTER — Ambulatory Visit: Payer: 59 | Admitting: Internal Medicine

## 2016-02-26 ENCOUNTER — Inpatient Hospital Stay (HOSPITAL_COMMUNITY)
Admission: AD | Admit: 2016-02-26 | Discharge: 2016-02-26 | Disposition: A | Discharge status: Home / Self Care | Payer: 59 | Source: Ambulatory Visit | Attending: Obstetrics and Gynecology | Admitting: Obstetrics and Gynecology

## 2016-02-26 ENCOUNTER — Inpatient Hospital Stay (HOSPITAL_COMMUNITY): Payer: 59

## 2016-02-26 ENCOUNTER — Encounter (HOSPITAL_COMMUNITY): Payer: Self-pay

## 2016-02-26 DIAGNOSIS — O368190 Decreased fetal movements, unspecified trimester, not applicable or unspecified: Secondary | ICD-10-CM | POA: Diagnosis not present

## 2016-02-26 DIAGNOSIS — O368130 Decreased fetal movements, third trimester, not applicable or unspecified: Secondary | ICD-10-CM

## 2016-02-26 DIAGNOSIS — Z3A32 32 weeks gestation of pregnancy: Secondary | ICD-10-CM | POA: Diagnosis not present

## 2016-02-26 DIAGNOSIS — M549 Dorsalgia, unspecified: Secondary | ICD-10-CM | POA: Diagnosis not present

## 2016-02-26 IMAGING — US US MFM FETAL BPP W/O NON-STRESS
1 series · 12 of 26 positions shown · non-contrast
Comparison: none

[Series 1: us mfm fetal bpp w/o non-stress · 26 acquisitions, 12 frames shown]
[im 2/26]
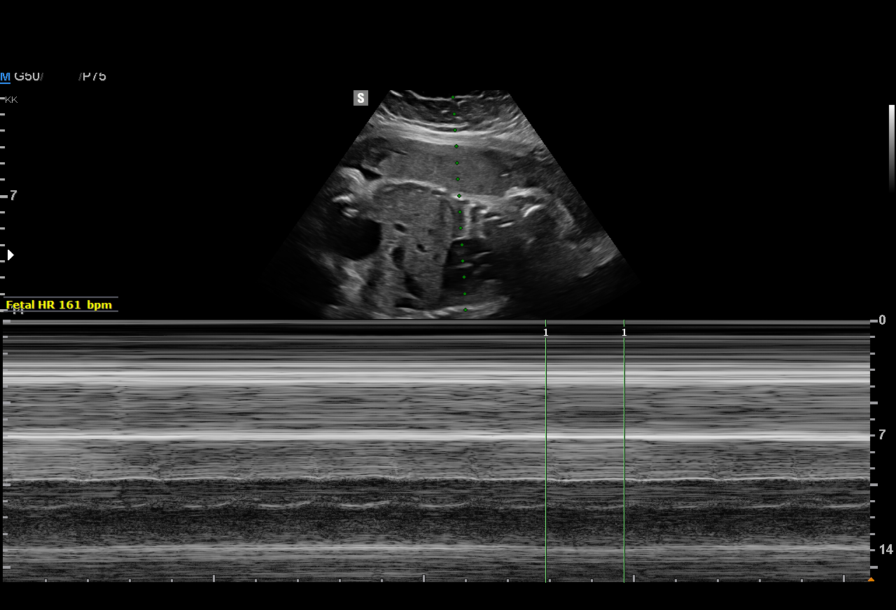
[im 4/26]
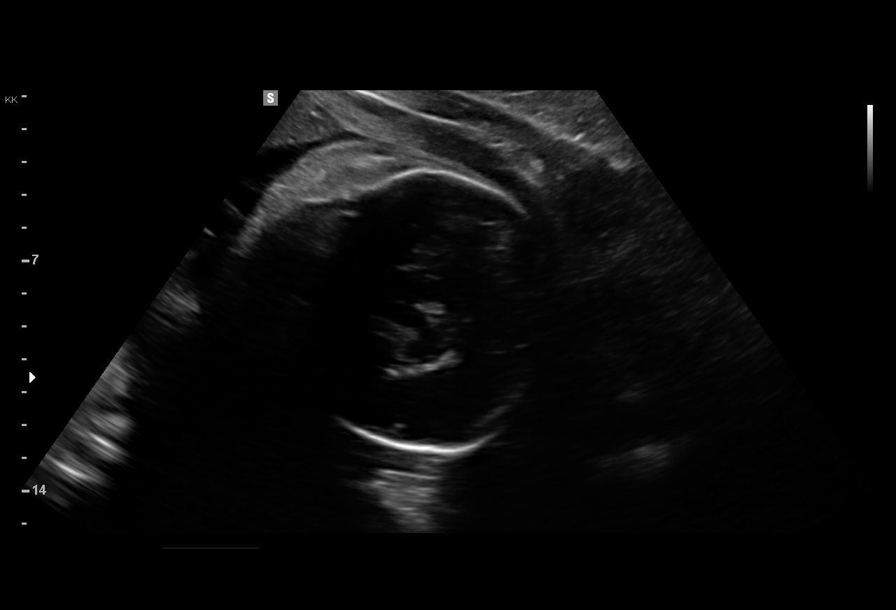
[im 6/26]
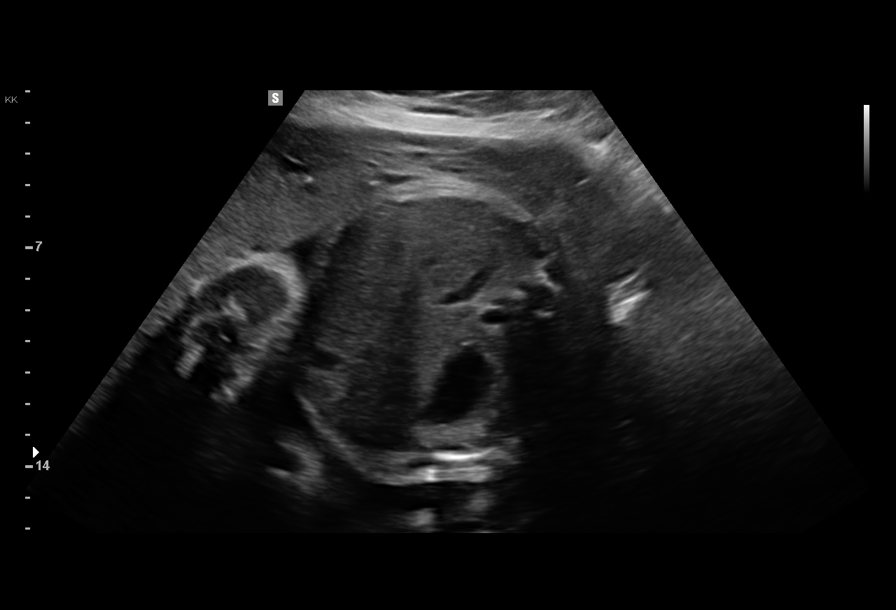
[im 8/26]
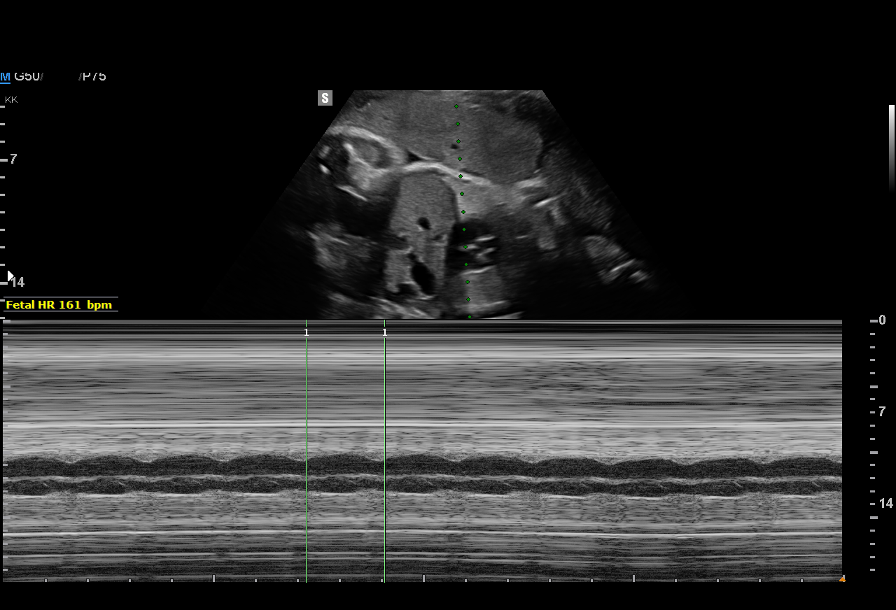
[im 10/26]
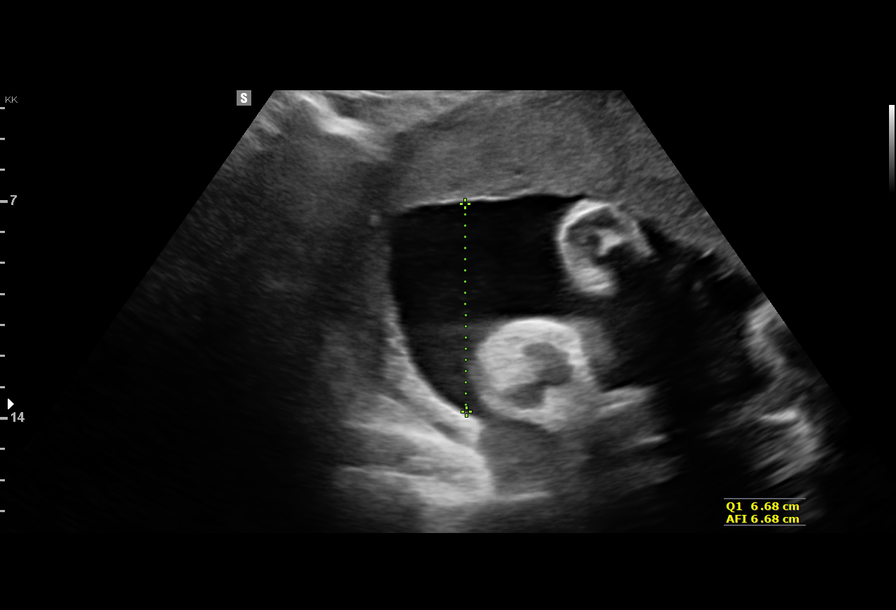
[im 12/26]
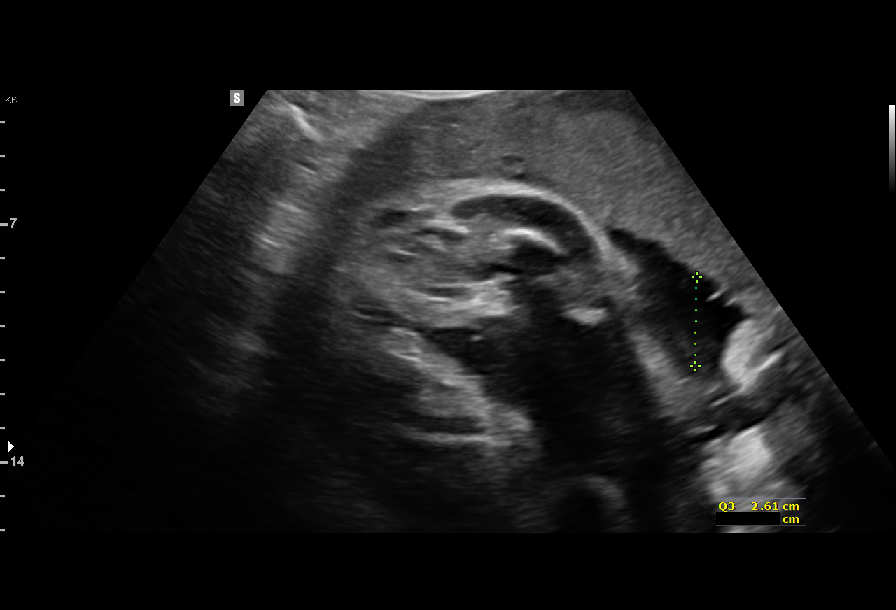
[im 15/26]
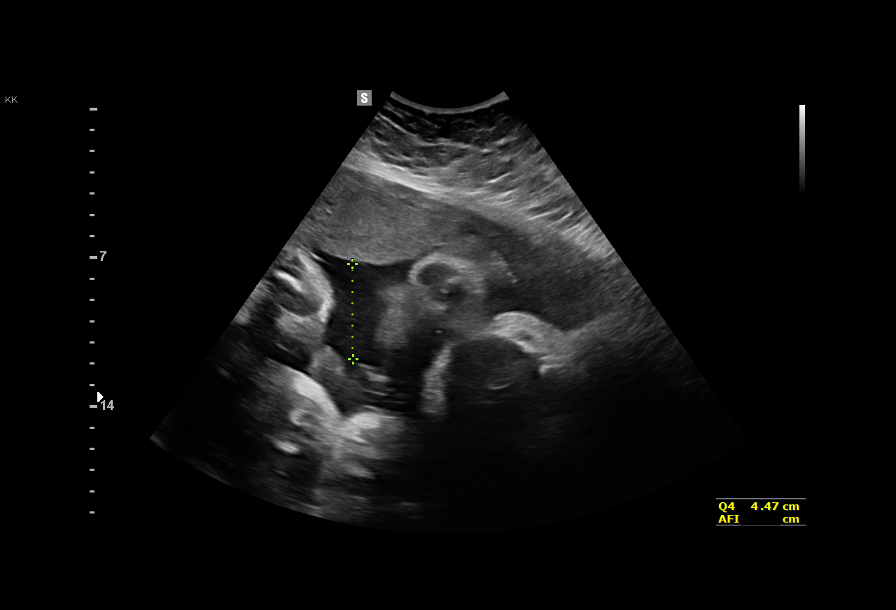
[im 17/26]
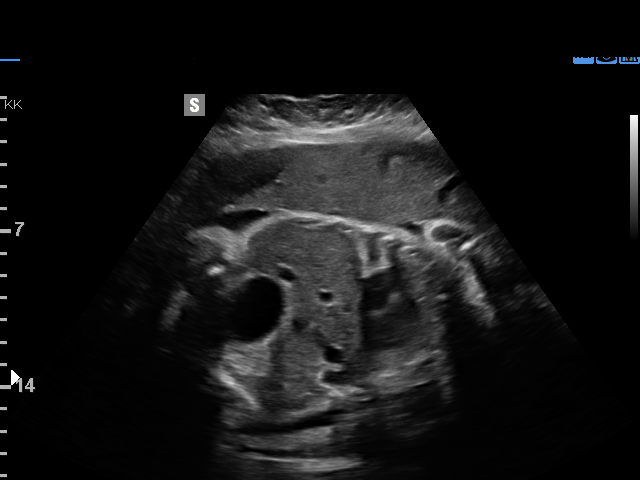
[im 19/26]
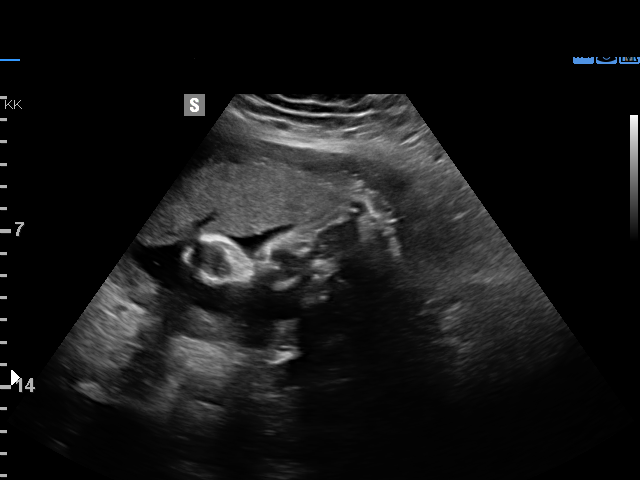
[im 21/26]
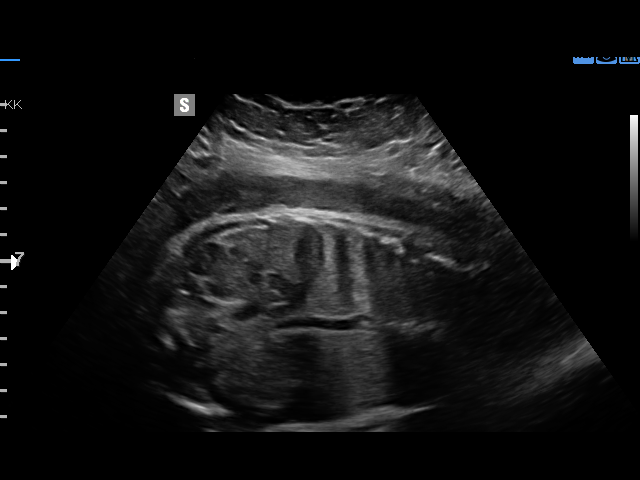
[im 23/26]
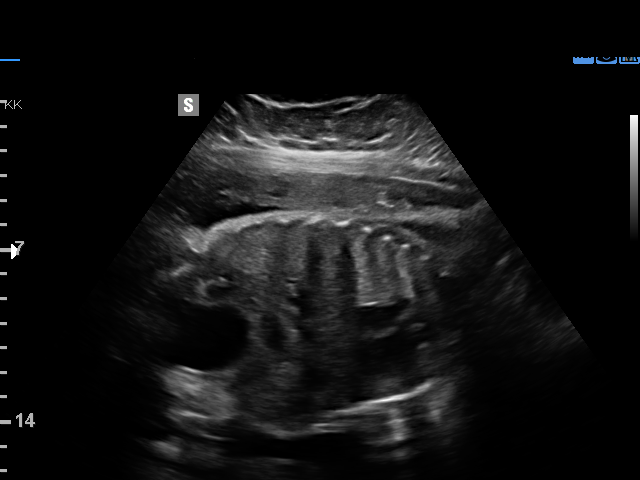
[im 25/26]
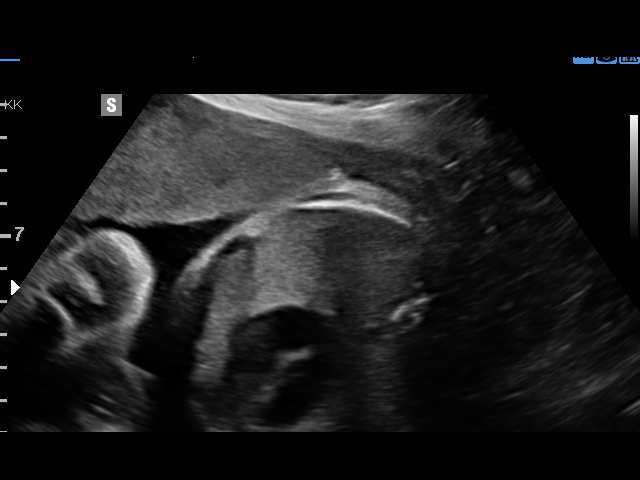

[12 of 26 positions shown; findings below may reference images not displayed]

Attending:        APONG      Secondary Phy.:   [facility] Nursing-
MAU/Triage

1  APONG             [PHONE_NUMBER]      [PHONE_NUMBER]     [PHONE_NUMBER]
Indications

32 weeks gestation of pregnancy
Decreased fetal movement                       [QK]
Poor obstetric history: Previous               [QK]
preeclampsia / eclampsia/gestational HTN
Poor obstetric history: Previous gestational   [QK]
diabetes
OB History

Gravidity:    2         Term:   1        Prem:   0        SAB:   0
TOP:          0       Ectopic:  0        Living: 1
Fetal Evaluation

Num Of Fetuses:     1
Fetal Heart         161
Rate(bpm):
Cardiac Activity:   Observed
Presentation:       Cephalic

Amniotic Fluid
AFI FV:      Subjectively within normal limits

AFI Sum(cm)     %Tile       Largest Pocket(cm)
17.31           63

RUQ(cm)       RLQ(cm)       LUQ(cm)        LLQ(cm)
6.68
Biophysical Evaluation
Amniotic F.V:   Pocket => 2 cm two         F. Tone:        Observed
planes
F. Movement:    Observed                   Score:          [DATE]
F. Breathing:   Observed
Gestational Age

LMP:           29w 2d        Date:  [DATE]                 EDD:   [DATE]
Best:          32w 4d     Det. By:  Early Ultrasound         EDD:   [DATE]
Impression

Single living intrauterine pregnancy at 32 weeks 4 days.
Normal amniotic fluid volume.
BPP [DATE].
Recommendations

Follow-up ultrasounds as clinically indicated.

## 2016-02-26 NOTE — MAU Provider Note (Signed)
History     CSN: [id number]  Arrival date and time: 02/26/16 1236   First Provider Initiated Contact with Patient 02/26/16 1303      Chief Complaint  Patient presents with  . Decreased Fetal Movement   HPI [name] is a 30 y.o. G2P1 at [redacted]w[redacted]d who presents to MAU today from the office for further evaluation of decreased fetal movement. The patient states that FHR was high and she was having prolonged accelerations. She was concerned and requested a BPP to ensure babies well being. The patient denies contractions, vaginal bleeding, LOF or complications with this pregnancy. She does have complaint of consistent low back pain.   OB History    Gravida Para Term Preterm AB TAB SAB Ectopic Multiple Living   Past Medical History  Diagnosis Date  . Anxiety   . Pre-eclampsia   . 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 Colposcopy  05/2011    lgsil    Family History  Problem Relation Age of Onset  . Liver disease Mother   . Breast cancer Mother 25  . Diabetes Maternal Uncle     Social History  Substance Use Topics  . Smoking status: Never Smoker   . Smokeless tobacco: Never Used  . Alcohol Use: 0.0 oz/week     Comment: 3-4 times a week    Allergies: No Known Allergies  Prescriptions prior to admission  Medication Sig Dispense Refill Last Dose  . acetaminophen (TYLENOL) 325 MG tablet Take 650 mg by mouth every 6 (six) hours as needed for mild pain or headache.   02/26/2016 at Unknown time  . ALPRAZolam (XANAX) 1 MG tablet Take 1 tablet (1 mg total) by mouth 2 (two) times daily as needed for anxiety. 60 tablet 5 Past Month at Unknown time  . aspirin 81 MG tablet Take 81 mg by mouth daily.   02/25/2016 at Unknown time  . butalbital-acetaminophen-caffeine  (FIORICET, ESGIC) 50-325-40 MG tablet Take 1 tablet by mouth 2 (two) times daily as needed for headache.   02/22/2016  . Prenatal Vit-Fe Fumarate-FA (PRENATAL MULTIVITAMIN) TABS tablet Take 1 tablet by mouth daily at 12 noon.   02/26/2016 at Unknown time    Review of Systems  Constitutional: Negative for fever and malaise/fatigue.  Gastrointestinal: Negative for abdominal pain.  Genitourinary:       Neg- vaginal bleeding, discharge, LOF   Physical Exam   Blood pressure 130/82, pulse 92, temperature 98.2 F (36.8 C), temperature source Oral, resp. rate 16, last menstrual period 08/05/2015.  Physical Exam  Nursing note and vitals reviewed. Constitutional: She is oriented to person, place, and time. She appears well-developed and well-nourished. No distress.  HENT:  Head: Normocephalic and atraumatic.  Cardiovascular: Normal rate.   Respiratory: Effort normal.  GI: Soft. She exhibits no distension and no mass. There is no tenderness. There is no rebound and no guarding.  Neurological: She is alert and oriented to person, place, and time.  Skin: Skin is warm and dry. No erythema.  Psychiatric: She has a normal mood and affect.    Fetal Monitoring: Baseline: 140 bpm Variability: moderate Accelerations: 15 x 15 Decelerations: none Contractions: mild UI  MAU Course  Procedures  None  MDM BPP, AFI per Dr. Henderson CloudHorvath Discussed results with Dr. Henderson CloudHorvath. Patient may be discharged at this time and follow-up as scheduled or sooner PRN  Assessment and Plan  A: SIUP at [id number] Reactive NST BPP 8/8 and normal AFI Decreased FM Back pain in pregnancy, third trimester  P: Discharge home Preterm labor precautions discussed Kick counts included on AVS Advised use of Tylenol, heat/ice to back, abdominal binder for support and possible massage therapy/chiropractics for back pain if desired and provider is approved to work with pregnant patients of her [place] Patient [facility] as scheduled or sooner PRN Patient may return to MAU as needed or if her condition were to change or worsen   Marny LowensteinJulie N Wenzel, PA-C  02/26/2016, 2:26 PM

## 2016-02-26 NOTE — Discharge Instructions (Signed)
Fetal Movement Counts  Patient Name: __________________________________________________ Patient Due Date: ____________________  Performing a fetal movement count is highly recommended in high-risk pregnancies, but it is good for every pregnant woman to do. Your health care provider may ask you to start counting fetal movements at 28 weeks of the pregnancy. Fetal movements often increase:  · After eating a full meal.  · After physical activity.  · After eating or drinking something sweet or cold.  · At rest.  Pay attention to when you feel the baby is most active. This will help you notice a pattern of your baby's sleep and wake cycles and what factors contribute to an increase in fetal movement. It is important to perform a fetal movement count at the same time each day when your baby is normally most active.   HOW TO COUNT FETAL MOVEMENTS  1. Find a quiet and comfortable area to sit or lie down on your left side. Lying on your left side provides the best blood and oxygen circulation to your baby.  2. Write down the day and time on a sheet of paper or in a journal.  3. Start counting kicks, flutters, swishes, rolls, or jabs in a 2-hour period. You should feel at least 10 movements within 2 hours.  4. If you do not feel 10 movements in 2 hours, wait 2-3 hours and count again. Look for a change in the pattern or not enough counts in 2 hours.  SEEK MEDICAL CARE IF:  · You feel less than 10 counts in 2 hours, tried twice.  · There is no movement in over an hour.  · The pattern is changing or taking longer each day to reach 10 counts in 2 hours.  · You feel the baby is not moving as he or she usually does.  Date: ____________ Movements: ____________ Start time: ____________ Finish time: ____________   Date: ____________ Movements: ____________ Start time: ____________ Finish time: ____________  Date: ____________ Movements: ____________ Start time: ____________ Finish time: ____________  Date: ____________ Movements:  ____________ Start time: ____________ Finish time: ____________  Date: ____________ Movements: ____________ Start time: ____________ Finish time: ____________  Date: ____________ Movements: ____________ Start time: ____________ Finish time: ____________  Date: ____________ Movements: ____________ Start time: ____________ Finish time: ____________  Date: ____________ Movements: ____________ Start time: ____________ Finish time: ____________   Date: ____________ Movements: ____________ Start time: ____________ Finish time: ____________  Date: ____________ Movements: ____________ Start time: ____________ Finish time: ____________  Date: ____________ Movements: ____________ Start time: ____________ Finish time: ____________  Date: ____________ Movements: ____________ Start time: ____________ Finish time: ____________  Date: ____________ Movements: ____________ Start time: ____________ Finish time: ____________  Date: ____________ Movements: ____________ Start time: ____________ Finish time: ____________  Date: ____________ Movements: ____________ Start time: ____________ Finish time: ____________   Date: ____________ Movements: ____________ Start time: ____________ Finish time: ____________  Date: ____________ Movements: ____________ Start time: ____________ Finish time: ____________  Date: ____________ Movements: ____________ Start time: ____________ Finish time: ____________  Date: ____________ Movements: ____________ Start time: ____________ Finish time: ____________  Date: ____________ Movements: ____________ Start time: ____________ Finish time: ____________  Date: ____________ Movements: ____________ Start time: ____________ Finish time: ____________  Date: ____________ Movements: ____________ Start time: ____________ Finish time: ____________   Date: ____________ Movements: ____________ Start time: ____________ Finish time: ____________  Date: ____________ Movements: ____________ Start time: ____________ Finish  time: ____________  Date: ____________ Movements: ____________ Start time: ____________ Finish time: ____________  Date: ____________ Movements: ____________ Start time:   ____________ Finish time: ____________  Date: ____________ Movements: ____________ Start time: ____________ Finish time: ____________  Date: ____________ Movements: ____________ Start time: ____________ Finish time: ____________  Date: ____________ Movements: ____________ Start time: ____________ Finish time: ____________   Date: ____________ Movements: ____________ Start time: ____________ Finish time: ____________  Date: ____________ Movements: ____________ Start time: ____________ Finish time: ____________  Date: ____________ Movements: ____________ Start time: ____________ Finish time: ____________  Date: ____________ Movements: ____________ Start time: ____________ Finish time: ____________  Date: ____________ Movements: ____________ Start time: ____________ Finish time: ____________  Date: ____________ Movements: ____________ Start time: ____________ Finish time: ____________  Date: ____________ Movements: ____________ Start time: ____________ Finish time: ____________   Date: ____________ Movements: ____________ Start time: ____________ Finish time: ____________  Date: ____________ Movements: ____________ Start time: ____________ Finish time: ____________  Date: ____________ Movements: ____________ Start time: ____________ Finish time: ____________  Date: ____________ Movements: ____________ Start time: ____________ Finish time: ____________  Date: ____________ Movements: ____________ Start time: ____________ Finish time: ____________  Date: ____________ Movements: ____________ Start time: ____________ Finish time: ____________  Date: ____________ Movements: ____________ Start time: ____________ Finish time: ____________   Date: ____________ Movements: ____________ Start time: ____________ Finish time: ____________  Date: ____________  Movements: ____________ Start time: ____________ Finish time: ____________  Date: ____________ Movements: ____________ Start time: ____________ Finish time: ____________  Date: ____________ Movements: ____________ Start time: ____________ Finish time: ____________  Date: ____________ Movements: ____________ Start time: ____________ Finish time: ____________  Date: ____________ Movements: ____________ Start time: ____________ Finish time: ____________  Date: ____________ Movements: ____________ Start time: ____________ Finish time: ____________   Date: ____________ Movements: ____________ Start time: ____________ Finish time: ____________  Date: ____________ Movements: ____________ Start time: ____________ Finish time: ____________  Date: ____________ Movements: ____________ Start time: ____________ Finish time: ____________  Date: ____________ Movements: ____________ Start time: ____________ Finish time: ____________  Date: ____________ Movements: ____________ Start time: ____________ Finish time: ____________  Date: ____________ Movements: ____________ Start time: ____________ Finish time: ____________     This information is not intended to replace advice given to you by your health care provider. Make sure you discuss any questions you have with your health care provider.     Document Released: 11/02/2006 Document Revised: 10/24/2014 Document Reviewed: 07/30/2012  Elsevier Interactive Patient Education ©2016 [employer]

## 2016-02-26 NOTE — MAU Note (Signed)
Decreased movement since last night, some back pain, denies contractions was seen by Dr. Henderson CloudHorvath was told babies HR too high, sent for further evaluation.

## 2016-03-05 ENCOUNTER — Encounter (HOSPITAL_COMMUNITY): Payer: Self-pay | Admitting: *Deleted

## 2016-03-05 ENCOUNTER — Inpatient Hospital Stay (HOSPITAL_COMMUNITY)
Admission: AD | Admit: 2016-03-05 | Discharge: 2016-03-05 | Disposition: A | Discharge status: Home / Self Care | Payer: 59 | Source: Ambulatory Visit | Attending: Obstetrics & Gynecology | Admitting: Obstetrics & Gynecology

## 2016-03-05 DIAGNOSIS — O133 Gestational [pregnancy-induced] hypertension without significant proteinuria, third trimester: Secondary | ICD-10-CM | POA: Diagnosis not present

## 2016-03-05 DIAGNOSIS — Z3A33 33 weeks gestation of pregnancy: Secondary | ICD-10-CM | POA: Insufficient documentation

## 2016-03-05 DIAGNOSIS — I10 Essential (primary) hypertension: Secondary | ICD-10-CM | POA: Diagnosis present

## 2016-03-05 LAB — COMPREHENSIVE METABOLIC PANEL
ALT: 12 U/L — ABNORMAL LOW (ref 14–54)
ANION GAP: 9 (ref 5–15)
AST: 15 U/L (ref 15–41)
Albumin: 2.7 g/dL — ABNORMAL LOW (ref 3.5–5.0)
Alkaline Phosphatase: 96 U/L (ref 38–126)
BUN: 10 mg/dL (ref 6–20)
CALCIUM: 8.9 mg/dL (ref 8.9–10.3)
CO2: 23 mmol/L (ref 22–32)
Chloride: 105 mmol/L (ref 101–111)
Creatinine, Ser: 0.64 mg/dL (ref 0.44–1.00)
GFR calc non Af Amer: >60 mL/min (ref >60)
Glucose, Bld: 130 mg/dL — ABNORMAL HIGH (ref 65–99)
POTASSIUM: 3.8 mmol/L (ref 3.5–5.1)
SODIUM: 137 mmol/L (ref 135–145)
TOTAL PROTEIN: 6.1 g/dL — AB (ref 6.5–8.1)
Total Bilirubin: 0.3 mg/dL (ref 0.3–1.2)

## 2016-03-05 LAB — CBC WITH DIFFERENTIAL/PLATELET
Basophils Absolute: 0 10*3/uL (ref 0.0–0.1)
Basophils Relative: 0 %
EOS ABS: 0.1 10*3/uL (ref 0.0–0.7)
EOS PCT: 1 %
HCT: 34.5 % — ABNORMAL LOW (ref 36.0–46.0)
Hemoglobin: 11.2 g/dL — ABNORMAL LOW (ref 12.0–15.0)
LYMPHS ABS: 1.8 10*3/uL (ref 0.7–4.0)
Lymphocytes Relative: 18 %
MCH: 27.7 pg (ref 26.0–34.0)
MCHC: 32.5 g/dL (ref 30.0–36.0)
MCV: 85.2 fL (ref 78.0–100.0)
MONOS PCT: 7 %
Monocytes Absolute: 0.7 10*3/uL (ref 0.1–1.0)
Neutro Abs: 7.5 10*3/uL (ref 1.7–7.7)
Neutrophils Relative %: 74 %
PLATELETS: 228 10*3/uL (ref 150–400)
RBC: 4.05 MIL/uL (ref 3.87–5.11)
RDW: 14.8 % (ref 11.5–15.5)
WBC: 10 10*3/uL (ref 4.0–10.5)

## 2016-03-05 LAB — PROTEIN / CREATININE RATIO, URINE: CREATININE, URINE: 19 mg/dL

## 2016-03-05 LAB — URINALYSIS, ROUTINE W REFLEX MICROSCOPIC
BILIRUBIN URINE: NEGATIVE
GLUCOSE, UA: 500 mg/dL — AB
HGB URINE DIPSTICK: NEGATIVE
Ketones, ur: NEGATIVE mg/dL
Leukocytes, UA: NEGATIVE
Nitrite: NEGATIVE
PH: 5.5 (ref 5.0–8.0)
Protein, ur: NEGATIVE mg/dL

## 2016-03-05 LAB — URIC ACID: URIC ACID, SERUM: 3.7 mg/dL (ref 2.3–6.6)

## 2016-03-05 LAB — LACTATE DEHYDROGENASE: LDH: 70 U/L — ABNORMAL LOW (ref 98–192)

## 2016-03-05 NOTE — MAU Provider Note (Signed)
History     CSN: [id number]  Arrival date and time: 03/05/16 2105   First Provider Initiated Contact with Patient 03/05/16 2124      Chief Complaint  Patient presents with  . Hypertension   HPI [name] is a 30 y.o. G2P1 at [redacted]w[redacted]d who presents to MAU today with complaint of elevated blood pressures. The patient states a history of pre-eclampsia with her last pregnancy requiring induction at 35 weeks. She states BP at home has been elevated this evening in the 150s/90s. She endorses headache today unrelieved with Fioricet. She rates her pain at 7/10 now. She denies blurred vision, floaters or peripheral edema. She has had mild RUQ abdominal pain today. She denies vaginal bleeding, LOF or contractions. She reports good fetal movement.   OB History    Gravida Para Term Preterm AB TAB SAB Ectopic Multiple Living   Past Medical History  Diagnosis Date  . Anxiety   . Pre-eclampsia   . 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 Colposcopy  05/2011    lgsil    Family History  Problem Relation Age of Onset  . Liver disease Mother   . Breast cancer Mother 20  . Diabetes Maternal Uncle     Social History  Substance Use Topics  . Smoking status: Never Smoker   . Smokeless tobacco: Never Used  . Alcohol Use: 0.0 oz/week     Comment: 3-4 times a week    Allergies: No Known Allergies  Prescriptions prior to admission  Medication Sig Dispense Refill Last Dose  . aspirin 81 MG tablet Take 81 mg by mouth daily.   03/05/2016 at Unknown time  . butalbital-acetaminophen-caffeine (FIORICET, ESGIC) 50-325-40 MG tablet Take 1 tablet by mouth 2 (two) times daily as needed for headache.   03/05/2016 at Unknown time  . Prenatal Vit-Fe Fumarate-FA (PRENATAL MULTIVITAMIN)  TABS tablet Take 1 tablet by mouth daily at 12 noon.   03/05/2016 at Unknown time  . acetaminophen (TYLENOL) 325 MG tablet Take 650 mg by mouth every 6 (six) hours as needed for mild pain or headache.   02/26/2016 at Unknown time  . ALPRAZolam (XANAX) 1 MG tablet Take 1 tablet (1 mg total) by mouth 2 (two) times daily as needed for anxiety. 60 tablet 5 Unknown at Unknown time    Review of Systems  Constitutional: Negative for fever and malaise/fatigue.  Eyes: Negative for blurred vision.       Neg - floaters  Cardiovascular: Negative for leg swelling.  Gastrointestinal: Positive for abdominal pain. Negative for nausea, vomiting, diarrhea and constipation.  Genitourinary:       Neg - vaginal bleeding, LOF  Neurological: Positive for headaches.   Physical Exam   Blood pressure 123/85, pulse 99, temperature 98.3 F (36.8 C), temperature source Oral, resp. rate 18, height  (1.676 m), weight 207 lb (93.895 kg), last menstrual period 08/05/2015.  Physical Exam  Nursing note and vitals reviewed. Constitutional: She is oriented to person, place, and time. She appears well-developed and well-nourished. No distress.  HENT:  Head: Normocephalic and atraumatic.  Cardiovascular: Normal rate.   Respiratory: Effort normal.  GI: Soft. She exhibits no distension and no mass. There  is tenderness (mild RUQ abdominal tenderness to palpation). There is no rebound and no guarding.  Musculoskeletal: She exhibits no edema.  Neurological: She is alert and oriented to person, place, and time. She has normal reflexes.  No clonus  Skin: Skin is warm and dry. No erythema.  Psychiatric: She has a normal mood and affect.    Results for orders placed or performed during the hospital encounter of 03/05/16 (from the past 24 hour(s))  Protein / creatinine ratio, urine     Status: None   Collection Time: 03/05/16  9:20 PM  Result Value Ref Range   Creatinine, Urine 19.00 mg/dL   Total Protein, Urine <6 mg/dL    Protein Creatinine Ratio        0.00 - 0.15 mg/mg[Cre]  Urinalysis, Routine w reflex microscopic (not at [facility])     Status: Abnormal   Collection Time: 03/05/16  9:20 PM  Result Value Ref Range   Color, Urine YELLOW YELLOW   APPearance CLEAR CLEAR   Specific Gravity, Urine <1.005 (L) 1.005 - 1.030   pH 5.5 5.0 - 8.0   Glucose, UA 500 (A) NEGATIVE mg/dL   Hgb urine dipstick NEGATIVE NEGATIVE   Bilirubin Urine NEGATIVE NEGATIVE   Ketones, ur NEGATIVE NEGATIVE mg/dL   Protein, ur NEGATIVE NEGATIVE mg/dL   Nitrite NEGATIVE NEGATIVE   Leukocytes, UA NEGATIVE NEGATIVE  CBC with Differential/Platelet     Status: Abnormal   Collection Time: 03/05/16  9:48 PM  Result Value Ref Range   WBC 10.0 4.0 - 10.5 K/uL   RBC 4.05 3.87 - 5.11 MIL/uL   Hemoglobin 11.2 (L) 12.0 - 15.0 g/dL   HCT 16.134.5 (L) 09.636.0 - 04.546.0 %   MCV 85.2 78.0 - 100.0 fL   MCH 27.7 26.0 - 34.0 pg   MCHC 32.5 30.0 - 36.0 g/dL   RDW 40.914.8 81.111.5 - 91.415.5 %   Platelets 228 150 - 400 K/uL   Neutrophils Relative % 74 %   Neutro Abs 7.5 1.7 - 7.7 K/uL   Lymphocytes Relative 18 %   Lymphs Abs 1.8 0.7 - 4.0 K/uL   Monocytes Relative 7 %   Monocytes Absolute 0.7 0.1 - 1.0 K/uL   Eosinophils Relative 1 %   Eosinophils Absolute 0.1 0.0 - 0.7 K/uL   Basophils Relative 0 %   Basophils Absolute 0.0 0.0 - 0.1 K/uL  Comprehensive metabolic panel     Status: Abnormal   Collection Time: 03/05/16  9:48 PM  Result Value Ref Range   Sodium 137 135 - 145 mmol/L   Potassium 3.8 3.5 - 5.1 mmol/L   Chloride 105 101 - 111 mmol/L   CO2 23 22 - 32 mmol/L   Glucose, Bld 130 (H) 65 - 99 mg/dL   BUN 10 6 - 20 mg/dL   Creatinine, Ser 7.820.64 0.44 - 1.00 mg/dL   Calcium 8.9 8.9 - 95.610.3 mg/dL   Total Protein 6.1 (L) 6.5 - 8.1 g/dL   Albumin 2.7 (L) 3.5 - 5.0 g/dL   AST 15 15 - 41 U/L   ALT 12 (L) 14 - 54 U/L   Alkaline Phosphatase 96 38 - 126 U/L   Total Bilirubin 0.3 0.3 - 1.2 mg/dL   GFR calc non Af Amer >60 >60 mL/min   GFR calc Af Amer >60 >60  mL/min   Anion gap 9 5 - 15  Uric acid     Status: None   Collection Time: 03/05/16  9:48 PM  Result Value  Ref Range   Uric Acid, Serum 3.7 2.3 - 6.6 mg/dL  Lactate dehydrogenase     Status: Abnormal   Collection Time: 03/05/16  9:48 PM  Result Value Ref Range   LDH 70 (L) 98 - 192 U/L    Fetal Monitoring: Baseline: 140 bpm Variability: moderate Accelerations: 15 x 15 Decelerations: none Contractions: few, irregular  MAU Course  Procedures None  MDM UA, CBC, CMP, Uric Acid, LDH and Urine protein/creatinine ratio today  Serial BPs Patient declines pain medication Discussed patient with Dr. Mora Appl. Recommends discharge at this time with precautions. Follow-up in the office next week, will need to start NSTs for [facility].  Assessment and Plan  A: SIUP at [redacted]w[redacted]d Gestation HTN  P: Discharge home HTN and pre-eclampsia precautions discussed Patient advised to follow-up with [facility] next week for further evaluation of HTN and NST Patient may return to MAU as needed or if her condition were to change or worsen  Marny Lowenstein, PA-C  03/05/2016, 10:36 PM

## 2016-03-05 NOTE — Discharge Instructions (Signed)
Hypertension During Pregnancy °Hypertension is also called high blood pressure. Blood pressure moves blood in your body. Sometimes, the force that moves the blood becomes too strong. When you are pregnant, this condition should be watched carefully. It can cause problems for you and your baby. °HOME CARE  °· Make and keep all of your doctor visits. °· Take medicine as told by your doctor. Tell your doctor about all medicines you take. °· Eat very little salt. °· Exercise regularly. °· Do not drink alcohol. °· Do not smoke. °· Do not have drinks with caffeine. °· Lie on your left side when resting. °· Your health care provider may ask you to take one low-dose aspirin (81mg) each day. °GET HELP RIGHT AWAY IF: °· You have bad belly (abdominal) pain. °· You have sudden puffiness (swelling) in the hands, ankles, or face. °· You gain 4 pounds (1.8 kilograms) or more in 1 week. °· You throw up (vomit) repeatedly. °· You have bleeding from the vagina. °· You do not feel the baby moving as much. °· You have a headache. °· You have blurred or double vision. °· You have muscle twitching or spasms. °· You have shortness of breath. °· You have blue fingernails and lips. °· You have blood in your pee (urine). °MAKE SURE YOU: °· Understand these instructions. °· Will watch your condition. °· Will get help right away if you are not doing well or get worse. °  °This information is not intended to replace advice given to you by your health care provider. Make sure you discuss any questions you have with your health care provider. °  °Document Released: 11/05/2010 Document Revised: 10/24/2014 Document Reviewed: 05/02/2013 °Elsevier Interactive Patient Education ©2016 [employer] ° °

## 2016-03-05 NOTE — MAU Note (Signed)
Hx of PreE with G1, elevated BPs today.

## 2016-03-17 ENCOUNTER — Inpatient Hospital Stay (HOSPITAL_COMMUNITY)
Admission: AD | Admit: 2016-03-17 | Discharge: 2016-03-17 | Disposition: A | Discharge status: Home / Self Care | Payer: 59 | Source: Ambulatory Visit | Attending: Obstetrics and Gynecology | Admitting: Obstetrics and Gynecology

## 2016-03-17 ENCOUNTER — Encounter (HOSPITAL_COMMUNITY): Payer: Self-pay

## 2016-03-17 DIAGNOSIS — R109 Unspecified abdominal pain: Secondary | ICD-10-CM | POA: Diagnosis not present

## 2016-03-17 DIAGNOSIS — Z3A35 35 weeks gestation of pregnancy: Secondary | ICD-10-CM | POA: Insufficient documentation

## 2016-03-17 DIAGNOSIS — O9989 Other specified diseases and conditions complicating pregnancy, childbirth and the puerperium: Secondary | ICD-10-CM

## 2016-03-17 DIAGNOSIS — R202 Paresthesia of skin: Secondary | ICD-10-CM | POA: Insufficient documentation

## 2016-03-17 DIAGNOSIS — O26899 Other specified pregnancy related conditions, unspecified trimester: Secondary | ICD-10-CM

## 2016-03-17 DIAGNOSIS — O163 Unspecified maternal hypertension, third trimester: Secondary | ICD-10-CM | POA: Diagnosis not present

## 2016-03-17 DIAGNOSIS — I10 Essential (primary) hypertension: Secondary | ICD-10-CM | POA: Diagnosis present

## 2016-03-17 DIAGNOSIS — O26893 Other specified pregnancy related conditions, third trimester: Secondary | ICD-10-CM | POA: Diagnosis not present

## 2016-03-17 LAB — CBC
HCT: 38.8 % (ref 36.0–46.0)
Hemoglobin: 13 g/dL (ref 12.0–15.0)
MCH: 28.3 pg (ref 26.0–34.0)
MCHC: 33.5 g/dL (ref 30.0–36.0)
MCV: 84.3 fL (ref 78.0–100.0)
PLATELETS: 215 10*3/uL (ref 150–400)
RBC: 4.6 MIL/uL (ref 3.87–5.11)
RDW: 15 % (ref 11.5–15.5)
WBC: 9.4 10*3/uL (ref 4.0–10.5)

## 2016-03-17 LAB — COMPREHENSIVE METABOLIC PANEL
ALT: 12 U/L — ABNORMAL LOW (ref 14–54)
ANION GAP: 10 (ref 5–15)
AST: 22 U/L (ref 15–41)
Albumin: 3 g/dL — ABNORMAL LOW (ref 3.5–5.0)
Alkaline Phosphatase: 121 U/L (ref 38–126)
BUN: 8 mg/dL (ref 6–20)
CHLORIDE: 104 mmol/L (ref 101–111)
CO2: 19 mmol/L — ABNORMAL LOW (ref 22–32)
Calcium: 8.9 mg/dL (ref 8.9–10.3)
Creatinine, Ser: 0.65 mg/dL (ref 0.44–1.00)
Glucose, Bld: 83 mg/dL (ref 65–99)
POTASSIUM: 4.5 mmol/L (ref 3.5–5.1)
Sodium: 133 mmol/L — ABNORMAL LOW (ref 135–145)
Total Bilirubin: 0.5 mg/dL (ref 0.3–1.2)
Total Protein: 6.6 g/dL (ref 6.5–8.1)

## 2016-03-17 LAB — URINALYSIS, ROUTINE W REFLEX MICROSCOPIC
BILIRUBIN URINE: NEGATIVE
Glucose, UA: NEGATIVE mg/dL
Ketones, ur: 15 mg/dL — AB
Nitrite: NEGATIVE
Protein, ur: NEGATIVE mg/dL
SPECIFIC GRAVITY, URINE: 1.02 (ref 1.005–1.030)
pH: 6.5 (ref 5.0–8.0)

## 2016-03-17 LAB — URINE MICROSCOPIC-ADD ON

## 2016-03-17 LAB — PROTEIN / CREATININE RATIO, URINE
CREATININE, URINE: 192 mg/dL
PROTEIN CREATININE RATIO: 0.25 mg/mg{creat} — AB (ref 0.00–0.15)
Total Protein, Urine: 48 mg/dL

## 2016-03-17 MED ORDER — LACTATED RINGERS IV BOLUS (SEPSIS)
1000.0000 mL | Freq: Once | INTRAVENOUS | Status: AC
  Administered 2016-03-17: 1000 mL via INTRAVENOUS

## 2016-03-17 MED ORDER — CYCLOBENZAPRINE HCL 10 MG PO TABS
10.0000 mg | ORAL_TABLET | Freq: Once | ORAL | Status: AC
  Administered 2016-03-17: 10 mg via ORAL
  Filled 2016-03-17: qty 1

## 2016-03-17 MED ORDER — ACETAMINOPHEN 325 MG PO TABS
650.0000 mg | ORAL_TABLET | Freq: Once | ORAL | Status: AC
  Administered 2016-03-17: 650 mg via ORAL
  Filled 2016-03-17: qty 2

## 2016-03-17 NOTE — MAU Provider Note (Signed)
History     CSN: [id number]  Arrival date and time: 03/17/16 1251   None     Chief Complaint  Patient presents with  . Hypertension   HPI  Pt is [redacted]w[redacted]d pregnant G2P1 who presents with hypertension and headache in pregnancy.  Pt had spots before her eyes last night, but none today. Pt has been following her blood pressures at home with elevations yesterday BP 122-149/78-101.  Pt had headache on top of head and back of head.  Pt has a lot of headaches anyway but this one is different and she feels like it is due to blood pressure.  Pt took a Fioricet yesterday without any relief of her headache.  Today pt's BP was 127/86 this morning has progressively increased to 139/'101 and 146/100.  Pt denies nausea, epigastric pain or vomiting.  Pt is having some mild contractions, which she is feeling.  Pt has some right lower quadrant pain/tenderness- has not taken anything for the pain..  Pt notes that she often has back pain, but this is different..  Pt notes some increase in swelling in hands and tingling, but not feet.  Pt denies vaginal discharge or bleeding/spotting or UTI sx. RN note:      Expand All Collapse All   Pt has been being follwed for elevated B/P since last week 130-150's /90-100. Pt c/o H/A . Saw some spots last night. C/o some right flank pain.           Past Medical History  Diagnosis Date  . Anxiety   . Pre-eclampsia   . 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 Colposcopy  05/2011    lgsil  . Wisdom tooth extraction      Family History  Problem Relation Age of Onset  . Liver disease Mother   . Breast cancer Mother 38  . Diabetes Maternal Uncle     Social History  Substance Use Topics  . Smoking status: Never Smoker   . Smokeless tobacco: Never Used  . Alcohol Use:  0.0 oz/week     Comment: 3-4 times a week    Allergies: No Known Allergies  Prescriptions prior to admission  Medication Sig Dispense Refill Last Dose  . acetaminophen (TYLENOL) 325 MG tablet Take 650 mg by mouth every 6 (six) hours as needed for mild pain or headache.   02/26/2016 at Unknown time  . ALPRAZolam (XANAX) 1 MG tablet Take 1 tablet (1 mg total) by mouth 2 (two) times daily as needed for anxiety. 60 tablet 5 Unknown at Unknown time  . aspirin 81 MG tablet Take 81 mg by mouth daily.   03/05/2016 at Unknown time  . butalbital-acetaminophen-caffeine (FIORICET, ESGIC) 50-325-40 MG tablet Take 1 tablet by mouth 2 (two) times daily as needed for headache.   03/05/2016 at Unknown time  . Prenatal Vit-Fe Fumarate-FA (PRENATAL MULTIVITAMIN) TABS tablet Take 1 tablet by mouth daily at 12 noon.   03/05/2016 at Unknown time    Review of Systems  Constitutional: Negative for fever and chills.  Gastrointestinal: Negative for nausea, vomiting, abdominal pain, diarrhea and constipation.  Genitourinary: Negative for dysuria.  Musculoskeletal: Positive for back pain.   Physical Exam   Blood pressure 140/92, pulse 97, temperature 99.3 F (37.4 C), resp. rate 18, height 5\' 5"  (1.651  m), weight 203 lb 1.6 oz (92.126 kg), last menstrual period 08/05/2015.  Physical Exam  Nursing note and vitals reviewed. Constitutional: She is oriented to person, place, and time. She appears well-developed and well-nourished.  HENT:  Head: Normocephalic.  Eyes: Pupils are equal, round, and reactive to light.  Neck: Normal range of motion. Neck supple.  Cardiovascular: Normal rate.   Respiratory: Effort normal.  GI: Soft. She exhibits no distension. There is tenderness. There is no rebound and no guarding.  NST reactive Baseline FHR 150 bpm  With 15x15 accelerations, no decelerations, occ ctx at discharge  Musculoskeletal: Normal range of motion. She exhibits no edema.  Neurological: She is alert and oriented  to person, place, and time.  Skin: Skin is warm and dry.  Psychiatric: She has a normal mood and affect.    MAU Course  Procedures LR 1 liter IV Results for orders placed or performed during the hospital encounter of 03/17/16 (from the past 24 hour(s))  Urinalysis, Routine w reflex microscopic (not at [facility])     Status: Abnormal   Collection Time: 03/17/16  1:05 PM  Result Value Ref Range   Color, Urine YELLOW YELLOW   APPearance CLEAR CLEAR   Specific Gravity, Urine 1.020 1.005 - 1.030   pH 6.5 5.0 - 8.0   Glucose, UA NEGATIVE NEGATIVE mg/dL   Hgb urine dipstick TRACE (A) NEGATIVE   Bilirubin Urine NEGATIVE NEGATIVE   Ketones, ur 15 (A) NEGATIVE mg/dL   Protein, ur NEGATIVE NEGATIVE mg/dL   Nitrite NEGATIVE NEGATIVE   Leukocytes, UA TRACE (A) NEGATIVE  Urine microscopic-add on     Status: Abnormal   Collection Time: 03/17/16  1:05 PM  Result Value Ref Range   Squamous Epithelial / LPF 6-30 (A) NONE SEEN   WBC, UA 0-5 0 - 5 WBC/hpf   RBC / HPF 0-5 0 - 5 RBC/hpf   Bacteria, UA FEW (A) NONE SEEN  Protein / creatinine ratio, urine     Status: Abnormal   Collection Time: 03/17/16  1:05 PM  Result Value Ref Range   Creatinine, Urine 192.00 mg/dL   Total Protein, Urine 48 mg/dL   Protein Creatinine Ratio 0.25 (H) 0.00 - 0.15 mg/mg[Cre]  CBC     Status: None   Collection Time: 03/17/16  2:08 PM  Result Value Ref Range   WBC 9.4 4.0 - 10.5 K/uL   RBC 4.60 3.87 - 5.11 MIL/uL   Hemoglobin 13.0 12.0 - 15.0 g/dL   HCT 40.938.8 81.136.0 - 91.446.0 %   MCV 84.3 78.0 - 100.0 fL   MCH 28.3 26.0 - 34.0 pg   MCHC 33.5 30.0 - 36.0 g/dL   RDW 78.215.0 95.611.5 - 21.315.5 %   Platelets 215 150 - 400 K/uL  Comprehensive metabolic panel     Status: Abnormal   Collection Time: 03/17/16  2:08 PM  Result Value Ref Range   Sodium 133 (L) 135 - 145 mmol/L   Potassium 4.5 3.5 - 5.1 mmol/L   Chloride 104 101 - 111 mmol/L   CO2 19 (L) 22 - 32 mmol/L   Glucose, Bld 83 65 - 99 mg/dL   BUN 8 6 - 20 mg/dL   Creatinine,  Ser 0.860.65 0.44 - 1.00 mg/dL   Calcium 8.9 8.9 - 57.810.3 mg/dL   Total Protein 6.6 6.5 - 8.1 g/dL   Albumin 3.0 (L) 3.5 - 5.0 g/dL   AST 22 15 - 41 U/L   ALT 12 (L) 14 - 54 U/L  Alkaline Phosphatase 121 38 - 126 U/L   Total Bilirubin 0.5 0.3 - 1.2 mg/dL   GFR calc non Af Amer >60 >60 mL/min   GFR calc Af Amer >60 >60 mL/min   Anion gap 10 5 - 15  BP on admission 128/108 ; BP 129/74 @ 1419 Headache  Not relieved with tylenol - will give flexeril Discussed with Dr. Dareen Piano- Will give Flexeril for headache and round ligament pain FHR baseline 150 bpm with 15x15 accelerations.  Pt had episode of mild ctx 2 to 3 minutes apart then abated at discharge  Assessment and Plan  Hypertension in pregnancy [redacted]w[redacted]d- normal labs today and reactive NST Abdominal pain in pregnancy- suspect round ligament pain- pt may take prescription Flexeril for pain Continue to record BPs- contact MD for BP.150/100 F/u in office tomorrow , as previously scheduled Kick counts  Pre-eclampsia information given  Fredna Stricker 03/17/2016, 1:30 PM

## 2016-03-17 NOTE — MAU Note (Signed)
Pt has been being follwed for elevated B/P since last week 130-150's /90-100. Pt c/o H/A . Saw some spots last night. C/o some right flank pain.

## 2016-03-18 ENCOUNTER — Other Ambulatory Visit: Payer: Self-pay | Admitting: Obstetrics & Gynecology

## 2016-03-18 ENCOUNTER — Observation Stay (HOSPITAL_COMMUNITY)
Admission: AD | Admit: 2016-03-18 | Discharge: 2016-03-19 | Disposition: A | Discharge status: Home / Self Care | Payer: 59 | Source: Ambulatory Visit | Attending: Obstetrics and Gynecology | Admitting: Obstetrics and Gynecology

## 2016-03-18 ENCOUNTER — Encounter (HOSPITAL_COMMUNITY): Payer: Self-pay | Admitting: *Deleted

## 2016-03-18 DIAGNOSIS — O133 Gestational [pregnancy-induced] hypertension without significant proteinuria, third trimester: Secondary | ICD-10-CM | POA: Insufficient documentation

## 2016-03-18 DIAGNOSIS — O09893 Supervision of other high risk pregnancies, third trimester: Secondary | ICD-10-CM | POA: Insufficient documentation

## 2016-03-18 DIAGNOSIS — O1493 Unspecified pre-eclampsia, third trimester: Secondary | ICD-10-CM | POA: Diagnosis not present

## 2016-03-18 DIAGNOSIS — O99353 Diseases of the nervous system complicating pregnancy, third trimester: Secondary | ICD-10-CM | POA: Insufficient documentation

## 2016-03-18 DIAGNOSIS — O99343 Other mental disorders complicating pregnancy, third trimester: Secondary | ICD-10-CM | POA: Insufficient documentation

## 2016-03-18 DIAGNOSIS — F419 Anxiety disorder, unspecified: Secondary | ICD-10-CM

## 2016-03-18 DIAGNOSIS — Z3A35 35 weeks gestation of pregnancy: Secondary | ICD-10-CM | POA: Insufficient documentation

## 2016-03-18 DIAGNOSIS — G43909 Migraine, unspecified, not intractable, without status migrainosus: Secondary | ICD-10-CM | POA: Insufficient documentation

## 2016-03-18 DIAGNOSIS — O1414 Severe pre-eclampsia complicating childbirth: Secondary | ICD-10-CM | POA: Diagnosis not present

## 2016-03-18 DIAGNOSIS — O139 Gestational [pregnancy-induced] hypertension without significant proteinuria, unspecified trimester: Secondary | ICD-10-CM

## 2016-03-18 LAB — COMPREHENSIVE METABOLIC PANEL
ALK PHOS: 110 U/L (ref 38–126)
ALT: 13 U/L — ABNORMAL LOW (ref 14–54)
ANION GAP: 7 (ref 5–15)
AST: 17 U/L (ref 15–41)
Albumin: 2.7 g/dL — ABNORMAL LOW (ref 3.5–5.0)
BUN: 8 mg/dL (ref 6–20)
CALCIUM: 8.8 mg/dL — AB (ref 8.9–10.3)
CO2: 20 mmol/L — AB (ref 22–32)
Chloride: 105 mmol/L (ref 101–111)
Creatinine, Ser: 0.53 mg/dL (ref 0.44–1.00)
GFR calc non Af Amer: >60 mL/min (ref >60)
Glucose, Bld: 81 mg/dL (ref 65–99)
Potassium: 4.3 mmol/L (ref 3.5–5.1)
SODIUM: 132 mmol/L — AB (ref 135–145)
TOTAL PROTEIN: 6.1 g/dL — AB (ref 6.5–8.1)
Total Bilirubin: 0.3 mg/dL (ref 0.3–1.2)

## 2016-03-18 LAB — URIC ACID: Uric Acid, Serum: 4.4 mg/dL (ref 2.3–6.6)

## 2016-03-18 LAB — ABO/RH: ABO/RH(D): A POS

## 2016-03-18 LAB — LACTATE DEHYDROGENASE: LDH: 100 U/L (ref 98–192)

## 2016-03-18 LAB — PROTEIN / CREATININE RATIO, URINE
CREATININE, URINE: 25 mg/dL
Total Protein, Urine: <6 mg/dL

## 2016-03-18 LAB — CBC
HCT: 36.4 % (ref 36.0–46.0)
HEMOGLOBIN: 12 g/dL (ref 12.0–15.0)
MCH: 27.8 pg (ref 26.0–34.0)
MCHC: 33 g/dL (ref 30.0–36.0)
MCV: 84.5 fL (ref 78.0–100.0)
PLATELETS: 211 10*3/uL (ref 150–400)
RBC: 4.31 MIL/uL (ref 3.87–5.11)
RDW: 15 % (ref 11.5–15.5)
WBC: 8.5 10*3/uL (ref 4.0–10.5)

## 2016-03-18 LAB — TYPE AND SCREEN
ABO/RH(D): A POS
ANTIBODY SCREEN: NEGATIVE

## 2016-03-18 MED ORDER — PRENATAL MULTIVITAMIN CH
1.0000 | ORAL_TABLET | Freq: Every day | ORAL | Status: DC
  Administered 2016-03-18 – 2016-03-19 (×2): 1 via ORAL
  Filled 2016-03-18 (×2): qty 1

## 2016-03-18 MED ORDER — OXYCODONE-ACETAMINOPHEN 5-325 MG PO TABS
1.0000 | ORAL_TABLET | ORAL | Status: DC | PRN
Start: 2016-03-18 — End: 2016-03-19
  Administered 2016-03-18 – 2016-03-19 (×5): 1 via ORAL
  Filled 2016-03-18 (×5): qty 1

## 2016-03-18 MED ORDER — DOCUSATE SODIUM 100 MG PO CAPS
100.0000 mg | ORAL_CAPSULE | Freq: Every day | ORAL | Status: DC
  Administered 2016-03-19: 100 mg via ORAL
  Filled 2016-03-18: qty 1

## 2016-03-18 MED ORDER — ZOLPIDEM TARTRATE 5 MG PO TABS: 5.0000 mg | ORAL_TABLET | Freq: Every evening | ORAL | Status: DC | PRN

## 2016-03-18 MED ORDER — BETAMETHASONE SOD PHOS & ACET 6 (3-3) MG/ML IJ SUSP
12.0000 mg | INTRAMUSCULAR | Status: AC
  Administered 2016-03-18 – 2016-03-19 (×2): 12 mg via INTRAMUSCULAR
  Filled 2016-03-18 (×2): qty 2

## 2016-03-18 MED ORDER — CALCIUM CARBONATE ANTACID 500 MG PO CHEW: 2.0000 | CHEWABLE_TABLET | ORAL | Status: DC | PRN

## 2016-03-18 MED ORDER — ACETAMINOPHEN 325 MG PO TABS
650.0000 mg | ORAL_TABLET | ORAL | Status: DC | PRN
  Filled 2016-03-18: qty 2

## 2016-03-18 NOTE — Progress Notes (Signed)
Dr. Henderson CloudHorvath called&  Informed of uterine ctxs, FHR decels x2 and pt's continued c/o HA.  MD states she's reviewed tracing, tracing overall reassurring and to call if HA persists after next dose of Percocet.

## 2016-03-18 NOTE — Progress Notes (Signed)
FHR increased to 170's at 2153, LTV moderate.

## 2016-03-18 NOTE — Progress Notes (Signed)
Report received from Macy MisJ. Walls, RN.

## 2016-03-18 NOTE — Progress Notes (Signed)
decel noted to be returning to baseline @ 1747 upon pt's return to bed from BR.  Once FHR up, noted to be @ 175.

## 2016-03-18 NOTE — H&P (Signed)
30 y.o. [redacted]w[redacted]d  G2P1 comes in c/o to office today for f/u blood pressures and headache.  Pt had 150s/90-100 yesterday in office but seen in MAU and BP come down to 140/92. Pt has had HAs in pregnancy that have been treated with fiorcet but unable to take over last couple days secondary BP.  Hx of severe preeclampsia in last pregnancy.  Pt had initial pregnancy BPs of 130s/80s and had a first tri 24 hour urine protein 124 .  Otherwise has good fetal movement and no bleeding.  Having some dizziness and spots in eyes.    Past Medical History  Diagnosis Date  . Anxiety   . Pre-eclampsia   . 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 Colposcopy  05/2011    lgsil  . Wisdom tooth extraction      OB History  Gravida Para Term Preterm AB SAB TAB Ectopic Multiple Living  # Outcome Date GA Lbr Len/2nd Weight Sex Delivery Anes PTL Lv  2 Current           1 Para               Social History   Social History  . Marital Status: Married    Spouse Name: N/A  . Number of Children: N/A  . Years of Education: N/A   Occupational History  . Not on file.   Social History Main Topics  . Smoking status: Never Smoker   . Smokeless tobacco: Never Used  . Alcohol Use: 0.0 oz/week     Comment: 3-4 times a week  . Drug Use: No     Comment: past use  . Sexual Activity:    Partners: Male    Pharmacist, hospital Protection: None   Other Topics Concern  . Not on file   Social History Narrative   Review of patient's allergies indicates no known allergies.    Prenatal Transfer Tool  Maternal Diabetes: No Genetic Screening: Normal-low risk female NIPT Maternal Ultrasounds/Referrals: Normal Fetal Ultrasounds or other Referrals:  None Maternal Substance Abuse:  No Significant Maternal Medications:   None Significant Maternal Lab Results: None  Other PNC: uncomplicated.    Filed Vitals:   03/18/16 1300 03/18/16 1510  BP: 126/80 135/81  Pulse: 79 70  Temp: 98.3 F (36.8 C) 98.4 F (36.9 C)  Resp: 18 20     Lungs/Cor:  NAD Abdomen:  soft, gravid Ex:  no cords, erythema SVE:  Will check tomorrow. FHTs:  140s, good STV, NST R Toco:  q occ  Results for orders placed or performed during the hospital encounter of 03/18/16 (from the past 24 hour(s))  Type and screen [facility] [place]     Status: None   Collection Time: 03/18/16 10:35 AM  Result Value Ref Range   ABO/RH(D) A POS    Antibody Screen NEG    Sample Expiration 03/21/2016   CBC on admission     Status: None   Collection Time: 03/18/16  1:03 PM  Result Value Ref Range   WBC 8.5 4.0 - 10.5 K/uL   RBC 4.31 3.87 - 5.11 MIL/uL   Hemoglobin 12.0 12.0 - 15.0 g/dL   HCT 16.1 09.6 - 04.5 %  MCV 84.5 78.0 - 100.0 fL   MCH 27.8 26.0 - 34.0 pg   MCHC 33.0 30.0 - 36.0 g/dL   RDW 04.515.0 40.911.5 - 81.115.5 %   Platelets 211 150 - 400 K/uL  Comprehensive metabolic panel     Status: Abnormal   Collection Time: 03/18/16  1:03 PM  Result Value Ref Range   Sodium 132 (L) 135 - 145 mmol/L   Potassium 4.3 3.5 - 5.1 mmol/L   Chloride 105 101 - 111 mmol/L   CO2 20 (L) 22 - 32 mmol/L   Glucose, Bld 81 65 - 99 mg/dL   BUN 8 6 - 20 mg/dL   Creatinine, Ser 9.140.53 0.44 - 1.00 mg/dL   Calcium 8.8 (L) 8.9 - 10.3 mg/dL   Total Protein 6.1 (L) 6.5 - 8.1 g/dL   Albumin 2.7 (L) 3.5 - 5.0 g/dL   AST 17 15 - 41 U/L   ALT 13 (L) 14 - 54 U/L   Alkaline Phosphatase 110 38 - 126 U/L   Total Bilirubin 0.3 0.3 - 1.2 mg/dL   GFR calc non Af Amer >60 >60 mL/min   GFR calc Af Amer >60 >60 mL/min   Anion gap 7 5 - 15  Uric acid     Status: None   Collection Time: 03/18/16  1:03 PM  Result Value Ref Range   Uric Acid, Serum 4.4 2.3 - 6.6 mg/dL  Lactate dehydrogenase     Status: None   Collection Time: 03/18/16  1:03 PM  Result Value Ref  Range   LDH 100 98 - 192 U/L    A/P   30 y.o. G2P1 5971w4d with elevated BPs and no proteinuria.  Difficult to tell if has severe sx- hx of migraines. BPs are more stable in hospital.  Will give BMZ for maturation of lungs.  Labs are stable.   Pt is preterm so will move to delivery if severe preeclampsia is evident.  Pt given percocet for HA since unable to take fiorcet.  GBS done in office today- pending.  Sai Moura A

## 2016-03-19 ENCOUNTER — Observation Stay (HOSPITAL_COMMUNITY): Payer: 59

## 2016-03-19 IMAGING — US US MFM FETAL BPP W/O NON-STRESS
1 series · 11 of 11 positions shown · non-contrast
Comparison: none

[Series 1: us mfm fetal bpp w/o non-stress · 11 acquisitions, 11 frames shown]
[im 1/11]
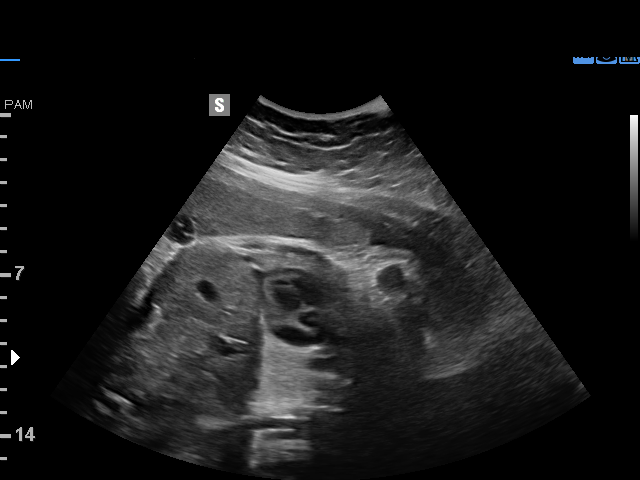
[im 2/11]
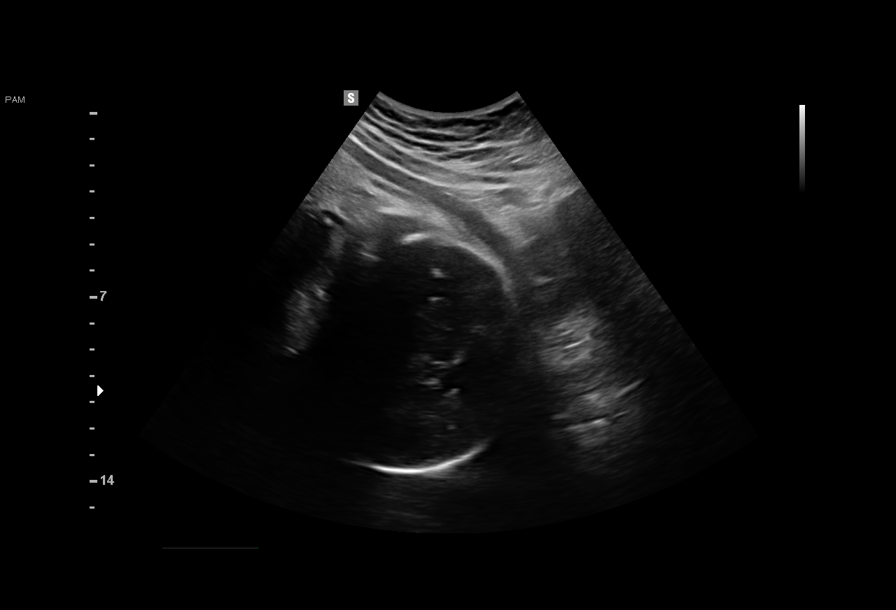
[im 3/11]
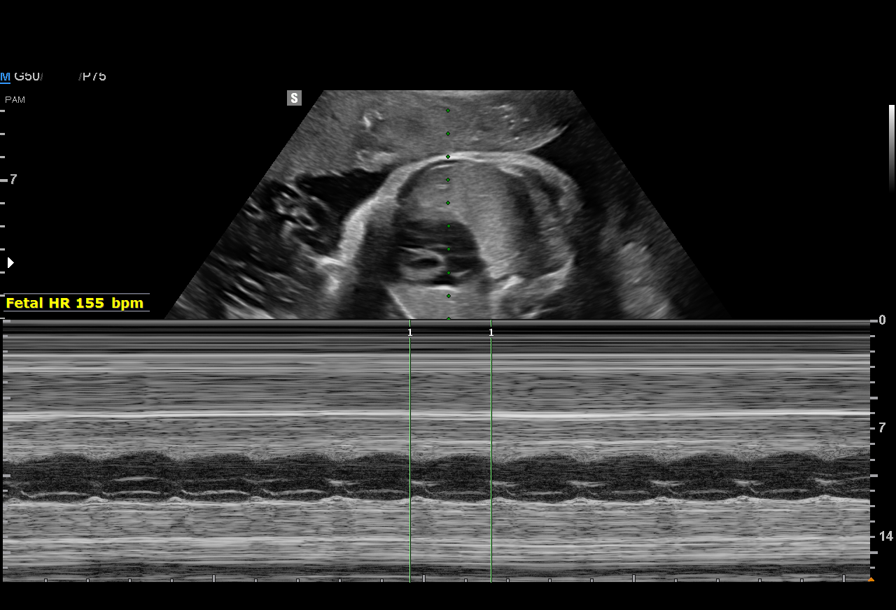
[im 4/11]
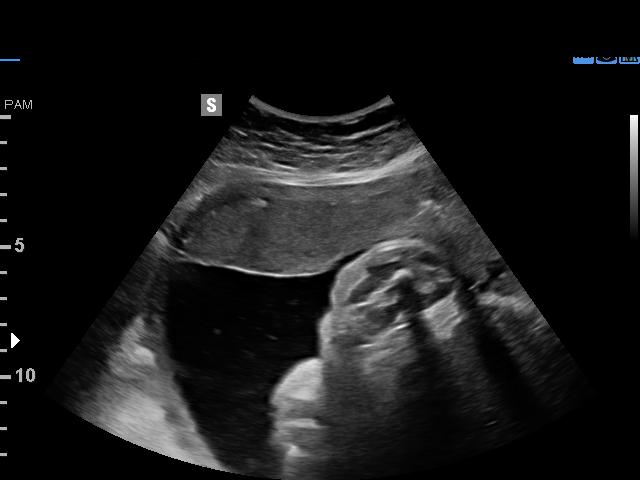
[im 5/11]
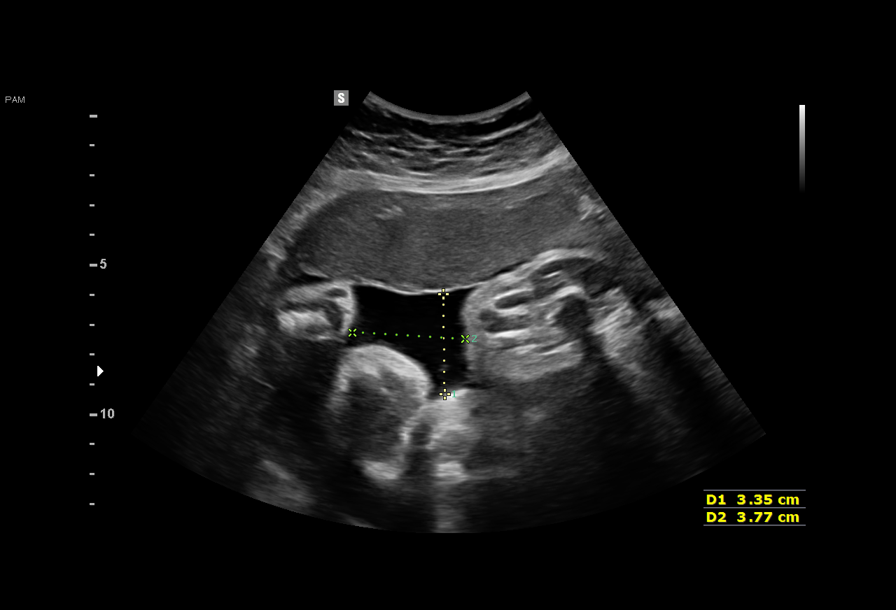
[im 6/11]
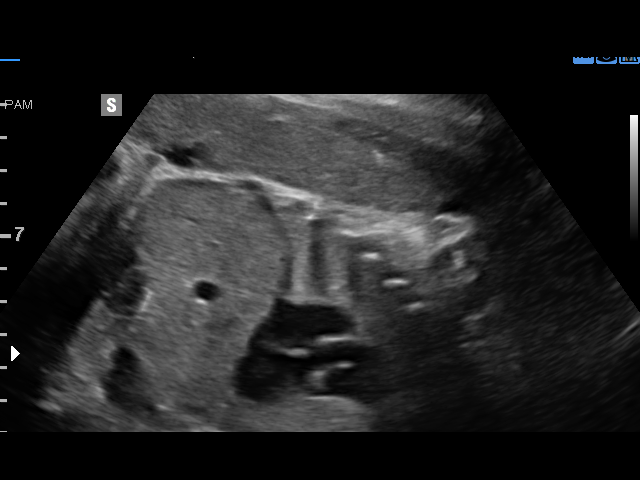
[im 7/11]
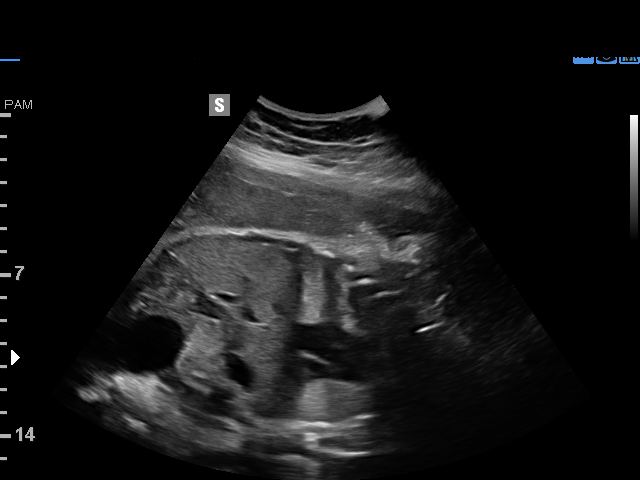
[im 8/11]
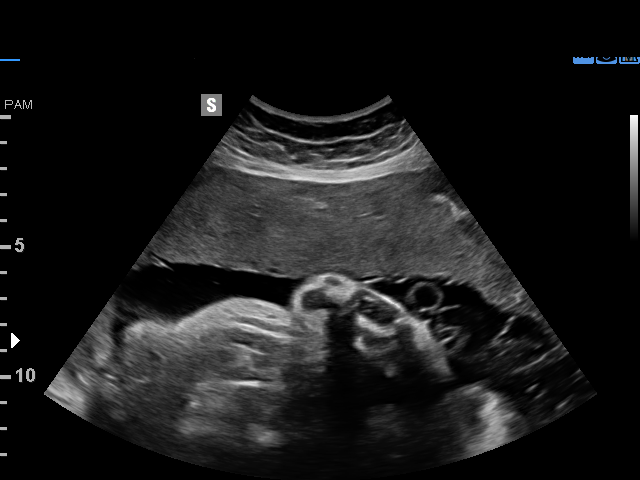
[im 9/11]
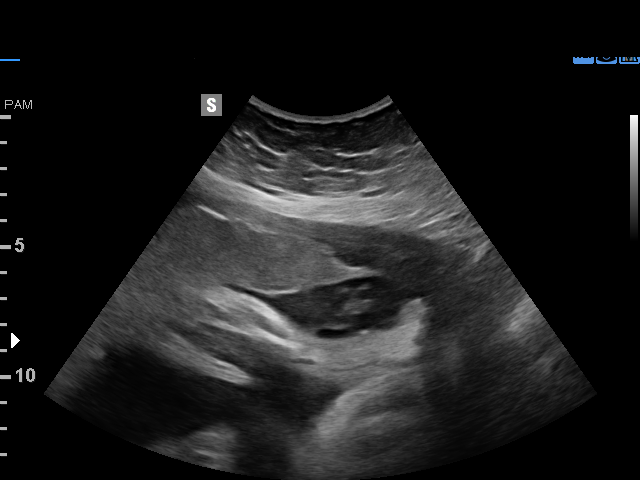
[im 10/11]
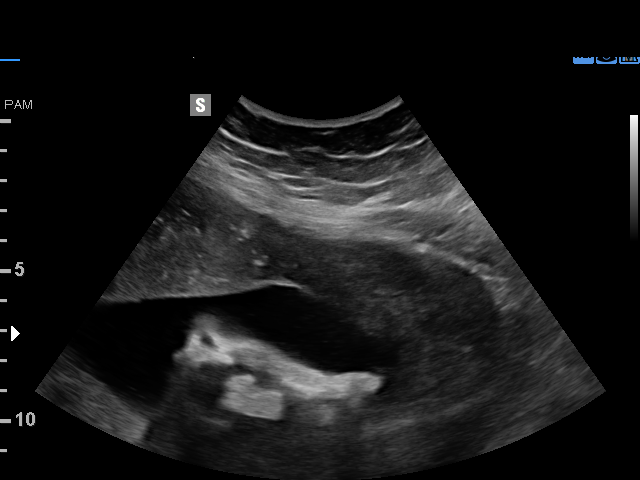
[im 11/11]
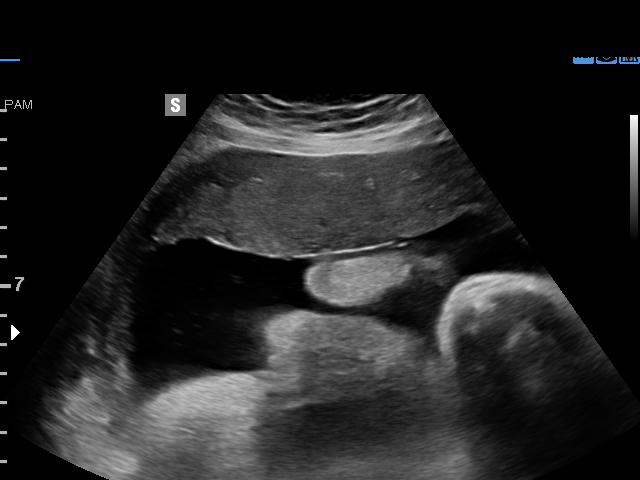

[11 of 11 positions shown; findings below may reference images not displayed]

Attending:        Camilla Louise Odder        Secondary Phy.:   [facility]-
MAU/Triage

1  SHARO BOSSE         [id number]      [id number]     [id number]
Indications

35 weeks gestation of pregnancy
Decreased fetal movement
Poor obstetric history: Previous
preeclampsia / eclampsia/gestational HTN
Poor obstetric history: Previous gestational
diabetes
OB History

Gravidity:    2         Term:   1        Prem:   0        SAB:   0
TOP:          0       Ectopic:  0        Living: 1
Fetal Evaluation

Num Of Fetuses:     1
Fetal Heart         155
Rate(bpm):
Cardiac Activity:   Observed
Presentation:       Cephalic
Placenta:           Anterior, above cervical os

Amniotic Fluid
AFI FV:      Subjectively within normal limits

Largest Pocket(cm)
3.4
Biophysical Evaluation

Amniotic F.V:   Within normal limits       F. Tone:        Observed
F. Movement:    Observed                   Score:          [DATE]
F. Breathing:   Observed
Gestational Age

LMP:           32w 3d       Date:   08/05/15                 EDD:   05/11/16
Best:          35w 5d    Det. By:   Early Ultrasound         EDD:   04/18/16
Impression

Single IUP at 35w 5d
Cephalic presentation
BPP [DATE]
Normal amniotic fluid volume
Recommendations

Follow-up ultrasounds as clinically indicated.

## 2016-03-19 MED ORDER — OXYCODONE-ACETAMINOPHEN 5-325 MG PO TABS: 1.0000 | ORAL_TABLET | ORAL | Status: DC | PRN

## 2016-03-19 NOTE — Discharge Instructions (Signed)
Hypertension During Pregnancy °Hypertension is also called high blood pressure. Blood pressure moves blood in your body. Sometimes, the force that moves the blood becomes too strong. When you are pregnant, this condition should be watched carefully. It can cause problems for you and your baby. °HOME CARE  °· Make and keep all of your doctor visits. °· Take medicine as told by your doctor. Tell your doctor about all medicines you take. °· Eat very little salt. °· Exercise regularly. °· Do not drink alcohol. °· Do not smoke. °· Do not have drinks with caffeine. °· Lie on your left side when resting. °· Your health care provider may ask you to take one low-dose aspirin (81mg) each day. °GET HELP RIGHT AWAY IF: °· You have bad belly (abdominal) pain. °· You have sudden puffiness (swelling) in the hands, ankles, or face. °· You gain 4 pounds (1.8 kilograms) or more in 1 week. °· You throw up (vomit) repeatedly. °· You have bleeding from the vagina. °· You do not feel the baby moving as much. °· You have a headache. °· You have blurred or double vision. °· You have muscle twitching or spasms. °· You have shortness of breath. °· You have blue fingernails and lips. °· You have blood in your pee (urine). °MAKE SURE YOU: °· Understand these instructions. °· Will watch your condition. °· Will get help right away if you are not doing well or get worse. °  °This information is not intended to replace advice given to you by your health care provider. Make sure you discuss any questions you have with your health care provider. °  °Document Released: 11/05/2010 Document Revised: 10/24/2014 Document Reviewed: 05/02/2013 °Elsevier Interactive Patient Education ©2016 [employer] ° °Preeclampsia and Eclampsia °Preeclampsia is a serious condition that develops only during pregnancy. It is also called toxemia of pregnancy. This condition causes high blood pressure along with other symptoms, such as swelling and headaches. These may  develop as the condition gets worse. Preeclampsia may occur 20 weeks or later into your pregnancy.  °Diagnosing and treating preeclampsia early is very important. If not treated early, it can cause serious problems for you and your baby. One problem it can lead to is eclampsia, which is a condition that causes muscle jerking or shaking (convulsions) in the mother. Delivering your baby is the best treatment for preeclampsia or eclampsia.  °RISK FACTORS °The cause of preeclampsia is not known. You may be more likely to develop preeclampsia if you have certain risk factors. These include:  °· Being pregnant for the first time. °· Having preeclampsia in a past pregnancy. °· Having a family history of preeclampsia. °· Having high blood pressure. °· Being pregnant with twins or triplets. °· Being 35 or older. °· Being African American. °· Having kidney disease or diabetes. °· Having medical conditions such as lupus or blood diseases. °· Being very overweight (obese). °SIGNS AND SYMPTOMS  °The earliest signs of preeclampsia are: °· High blood pressure. °· Increased protein in your urine. Your health care provider will check for this at every prenatal visit. °Other symptoms that can develop include:  °· Severe headaches. °· Sudden weight gain. °· Swelling of your hands, face, legs, and feet. °· Feeling sick to your stomach (nauseous) and throwing up (vomiting). °· Vision problems (blurred or double vision). °· Numbness in your face, arms, legs, and feet. °· Dizziness. °· Slurred speech. °· Sensitivity to bright lights. °· Abdominal pain. °DIAGNOSIS  °There are no screening tests   for preeclampsia. Your health care provider will ask you about symptoms and check for signs of preeclampsia during your prenatal visits. You may also have tests, including: °· Urine testing. °· Blood testing. °· Checking your baby's heart rate. °· Checking the health of your baby and your placenta using images created with sound waves  (ultrasound). °TREATMENT  °You can work out the best treatment approach together with your health care provider. It is very important to keep all prenatal appointments. If you have an increased risk of preeclampsia, you may need more frequent prenatal exams. °· Your health care provider may prescribe bed rest. °· You may have to eat as little salt as possible. °· You may need to take medicine to lower your blood pressure if the condition does not respond to more conservative measures. °· You may need to stay in the hospital if your condition is severe. There, treatment will focus on controlling your blood pressure and fluid retention. You may also need to take medicine to prevent seizures. °· If the condition gets worse, your baby may need to be delivered early to protect you and the baby. You may have your labor started with medicine (be induced), or you may have a cesarean delivery. °· Preeclampsia usually goes away after the baby is born. °HOME CARE INSTRUCTIONS  °· Only take over-the-counter or prescription medicines as directed by your health care provider. °· Lie on your left side while resting. This keeps pressure off your baby. °· Elevate your feet while resting. °· Get regular exercise. Ask your health care provider what type of exercise is safe for you. °· Avoid caffeine and alcohol. °· Do not smoke. °· Drink 6-8 glasses of water every day. °· Eat a balanced diet that is low in salt. Do not add salt to your food. °· Avoid stressful situations as much as possible. °· Get plenty of rest and sleep. °· Keep all prenatal appointments and tests as scheduled. °SEEK MEDICAL CARE IF: °· You are gaining more weight than expected. °· You have any headaches, abdominal pain, or nausea. °· You are bruising more than usual. °· You feel dizzy or light-headed. °SEEK IMMEDIATE MEDICAL CARE IF:  °· You develop sudden or severe swelling anywhere in your body. This usually happens in the legs. °· You gain 5 lb (2.3 kg) or more  in a week. °· You have a severe headache, dizziness, problems with your vision, or confusion. °· You have severe abdominal pain. °· You have lasting nausea or vomiting. °· You have a seizure. °· You have trouble moving any part of your body. °· You develop numbness in your body. °· You have trouble speaking. °· You have any abnormal bleeding. °· You develop a stiff neck. °· You pass out. °MAKE SURE YOU:  °· Understand these instructions. °· Will watch your condition. °· Will get help right away if you are not doing well or get worse. °  °This information is not intended to replace advice given to you by your health care provider. Make sure you discuss any questions you have with your health care provider. °  °Document Released: 09/30/2000 Document Revised: 10/08/2013 Document Reviewed: 07/26/2013 °Elsevier Interactive Patient Education ©2016 [employer]vier Inc. ° °

## 2016-03-19 NOTE — Discharge Summary (Signed)
Obstetric Discharge Summary Reason for Admission: observation/evaluation Prenatal Procedures: NST, ultrasound and pih   HEMOGLOBIN  Date Value Ref Range Status  03/18/2016 12.0 12.0 - 15.0 g/dL Final   HCT  Date Value Ref Range Status  03/18/2016 36.4 36.0 - 46.0 % Final   Results for orders placed or performed during the hospital encounter of 03/18/16 (from the past 24 hour(s))  Type and screen [facility]     Status: None   Collection Time: 03/18/16 10:35 AM  Result Value Ref Range   ABO/RH(D) A POS    Antibody Screen NEG    Sample Expiration 03/21/2016   CBC on admission     Status: None   Collection Time: 03/18/16  1:03 PM  Result Value Ref Range   WBC 8.5 4.0 - 10.5 K/uL   RBC 4.31 3.87 - 5.11 MIL/uL   Hemoglobin 12.0 12.0 - 15.0 g/dL   HCT 16.136.4 09.636.0 - 04.546.0 %   MCV 84.5 78.0 - 100.0 fL   MCH 27.8 26.0 - 34.0 pg   MCHC 33.0 30.0 - 36.0 g/dL   RDW 40.915.0 81.111.5 - 91.415.5 %   Platelets 211 150 - 400 K/uL  Comprehensive metabolic panel     Status: Abnormal   Collection Time: 03/18/16  1:03 PM  Result Value Ref Range   Sodium 132 (L) 135 - 145 mmol/L   Potassium 4.3 3.5 - 5.1 mmol/L   Chloride 105 101 - 111 mmol/L   CO2 20 (L) 22 - 32 mmol/L   Glucose, Bld 81 65 - 99 mg/dL   BUN 8 6 - 20 mg/dL   Creatinine, Ser 7.820.53 0.44 - 1.00 mg/dL   Calcium 8.8 (L) 8.9 - 10.3 mg/dL   Total Protein 6.1 (L) 6.5 - 8.1 g/dL   Albumin 2.7 (L) 3.5 - 5.0 g/dL   AST 17 15 - 41 U/L   ALT 13 (L) 14 - 54 U/L   Alkaline Phosphatase 110 38 - 126 U/L   Total Bilirubin 0.3 0.3 - 1.2 mg/dL   GFR calc non Af Amer >60 >60 mL/min   GFR calc Af Amer >60 >60 mL/min   Anion gap 7 5 - 15  Uric acid     Status: None   Collection Time: 03/18/16  1:03 PM  Result Value Ref Range   Uric Acid, Serum 4.4 2.3 - 6.6 mg/dL  Lactate dehydrogenase     Status: None   Collection Time: 03/18/16  1:03 PM  Result Value Ref Range   LDH 100 98 - 192 U/L  ABO/Rh     Status: None   Collection Time:  03/18/16  1:08 PM  Result Value Ref Range   ABO/RH(D) A POS   Protein / creatinine ratio, urine     Status: None   Collection Time: 03/18/16  2:05 PM  Result Value Ref Range   Creatinine, Urine 25.00 mg/dL   Total Protein, Urine <6 mg/dL   Protein Creatinine Ratio        0.00 - 0.15 mg/mg[Cre]    Discharge Diagnoses: PIH  Discharge Information: Date: 03/19/2016 Activity: modified bed rest Diet: routine Medications: Percocet for HA Condition: stable Instructions: routine antenatal with BP monitoring and precautions Discharge to: home Follow-up Information    [facility]   Why:  Tuesday next for ANT and visit   Contact information:   [address] [place] [address] [phone]  Gabrielle Zavala A 03/19/2016, 8:46 AM

## 2016-03-19 NOTE — Progress Notes (Signed)
30 y.o. G2P1 4968w5d HD#1 admitted for 35WKS ELEVATED BP.  Pt currently stable with HA much improved with percocet.  Good FM.  No other symptoms of preeclampsia.  Dizzyness resolved.  Filed Vitals:   03/18/16 1922 03/18/16 2325 03/19/16 0405 03/19/16 0652  BP: 147/87 129/61 133/73 123/79  Pulse: 83 89 90 85  Temp: 98.2 F (36.8 C)  98.1 F (36.7 C)   TempSrc: Oral  Oral   Resp: 18 18 18    Height:      Weight:        Lungs CTA Cor RRR Abd  Soft, gravid, nontender Ex SCDs FHTs  130s, good short term variability, NST R; three to four episodes of mild to moderate variables over 12 hours- overall cat 1 Toco  irreg  Results for orders placed or performed during the hospital encounter of 03/18/16 (from the past 24 hour(s))  [facility]     Status: None   Collection Time: 03/18/16 10:35 AM  Result Value Ref Range   ABO/RH(D) A POS    Antibody Screen NEG    Sample Expiration 03/21/2016   CBC on admission     Status: None   Collection Time: 03/18/16  1:03 PM  Result Value Ref Range   WBC 8.5 4.0 - 10.5 K/uL   RBC 4.31 3.87 - 5.11 MIL/uL   Hemoglobin 12.0 12.0 - 15.0 g/dL   HCT 19.136.4 47.836.0 - 29.546.0 %   MCV 84.5 78.0 - 100.0 fL   MCH 27.8 26.0 - 34.0 pg   MCHC 33.0 30.0 - 36.0 g/dL   RDW 62.115.0 30.811.5 - 65.715.5 %   Platelets 211 150 - 400 K/uL  Comprehensive metabolic panel     Status: Abnormal   Collection Time: 03/18/16  1:03 PM  Result Value Ref Range   Sodium 132 (L) 135 - 145 mmol/L   Potassium 4.3 3.5 - 5.1 mmol/L   Chloride 105 101 - 111 mmol/L   CO2 20 (L) 22 - 32 mmol/L   Glucose, Bld 81 65 - 99 mg/dL   BUN 8 6 - 20 mg/dL   Creatinine, Ser 8.460.53 0.44 - 1.00 mg/dL   Calcium 8.8 (L) 8.9 - 10.3 mg/dL   Total Protein 6.1 (L) 6.5 - 8.1 g/dL   Albumin 2.7 (L) 3.5 - 5.0 g/dL   AST 17 15 - 41 U/L   ALT 13 (L) 14 - 54 U/L   Alkaline Phosphatase 110 38 - 126 U/L   Total Bilirubin 0.3 0.3 - 1.2 mg/dL   GFR calc non Af Amer >60 >60 mL/min   GFR calc  Af Amer >60 >60 mL/min   Anion gap 7 5 - 15  Uric acid     Status: None   Collection Time: 03/18/16  1:03 PM  Result Value Ref Range   Uric Acid, Serum 4.4 2.3 - 6.6 mg/dL  Lactate dehydrogenase     Status: None   Collection Time: 03/18/16  1:03 PM  Result Value Ref Range   LDH 100 98 - 192 U/L  ABO/Rh     Status: None   Collection Time: 03/18/16  1:08 PM  Result Value Ref Range   ABO/RH(D) A POS   Protein / creatinine ratio, urine     Status: None   Collection Time: 03/18/16  2:05 PM  Result Value Ref Range   Creatinine, Urine 25.00 mg/dL   Total Protein, Urine <6 mg/dL   Protein Creatinine Ratio  0.00 - 0.15 mg/mg[Cre]    A/P:  HD#1  [redacted]w[redacted]d with PIH.  Currently no s/s of severe preeclampsia.  HAs getting better. BPs and labs are stable.  Pt received BMZ yesterday.  Will get BPP/AFI for occ variables and check presentation. Will obs for 23 hours.   Alanni Vader A

## 2016-03-21 ENCOUNTER — Encounter (HOSPITAL_COMMUNITY): Payer: Self-pay

## 2016-03-21 ENCOUNTER — Inpatient Hospital Stay (HOSPITAL_COMMUNITY)
Admission: AD | Admit: 2016-03-21 | Discharge: 2016-03-25 | DRG: 775 | Disposition: A | Discharge status: Home / Self Care | Payer: 59 | Source: Ambulatory Visit | Attending: Obstetrics and Gynecology | Admitting: Obstetrics and Gynecology

## 2016-03-21 DIAGNOSIS — O1493 Unspecified pre-eclampsia, third trimester: Secondary | ICD-10-CM | POA: Diagnosis not present

## 2016-03-21 DIAGNOSIS — Z833 Family history of diabetes mellitus: Secondary | ICD-10-CM

## 2016-03-21 DIAGNOSIS — O1413 Severe pre-eclampsia, third trimester: Secondary | ICD-10-CM

## 2016-03-21 DIAGNOSIS — Z7982 Long term (current) use of aspirin: Secondary | ICD-10-CM | POA: Diagnosis not present

## 2016-03-21 DIAGNOSIS — Z803 Family history of malignant neoplasm of breast: Secondary | ICD-10-CM

## 2016-03-21 DIAGNOSIS — O1414 Severe pre-eclampsia complicating childbirth: Secondary | ICD-10-CM | POA: Diagnosis present

## 2016-03-21 DIAGNOSIS — Z3A36 36 weeks gestation of pregnancy: Secondary | ICD-10-CM

## 2016-03-21 DIAGNOSIS — O24410 Gestational diabetes mellitus in pregnancy, diet controlled: Secondary | ICD-10-CM

## 2016-03-21 DIAGNOSIS — O1410 Severe pre-eclampsia, unspecified trimester: Secondary | ICD-10-CM | POA: Diagnosis present

## 2016-03-21 LAB — CBC
HEMATOCRIT: 36 % (ref 36.0–46.0)
Hemoglobin: 11.7 g/dL — ABNORMAL LOW (ref 12.0–15.0)
MCH: 27.5 pg (ref 26.0–34.0)
MCHC: 32.5 g/dL (ref 30.0–36.0)
MCV: 84.7 fL (ref 78.0–100.0)
PLATELETS: 201 10*3/uL (ref 150–400)
RBC: 4.25 MIL/uL (ref 3.87–5.11)
RDW: 15.2 % (ref 11.5–15.5)
WBC: 11.3 10*3/uL — AB (ref 4.0–10.5)

## 2016-03-21 LAB — TYPE AND SCREEN
ABO/RH(D): A POS
ANTIBODY SCREEN: NEGATIVE

## 2016-03-21 LAB — PROTEIN / CREATININE RATIO, URINE
CREATININE, URINE: 67 mg/dL
PROTEIN CREATININE RATIO: 0.16 mg/mg{creat} — AB (ref 0.00–0.15)
TOTAL PROTEIN, URINE: 11 mg/dL

## 2016-03-21 LAB — COMPREHENSIVE METABOLIC PANEL
ALBUMIN: 2.8 g/dL — AB (ref 3.5–5.0)
ALT: 13 U/L — ABNORMAL LOW (ref 14–54)
ANION GAP: 8 (ref 5–15)
AST: 16 U/L (ref 15–41)
Alkaline Phosphatase: 109 U/L (ref 38–126)
BILIRUBIN TOTAL: 0.3 mg/dL (ref 0.3–1.2)
BUN: 11 mg/dL (ref 6–20)
CHLORIDE: 107 mmol/L (ref 101–111)
CO2: 21 mmol/L — AB (ref 22–32)
Calcium: 8.5 mg/dL — ABNORMAL LOW (ref 8.9–10.3)
Creatinine, Ser: 0.5 mg/dL (ref 0.44–1.00)
GFR calc Af Amer: >60 mL/min (ref >60)
GFR calc non Af Amer: >60 mL/min (ref >60)
GLUCOSE: 79 mg/dL (ref 65–99)
POTASSIUM: 3.5 mmol/L (ref 3.5–5.1)
SODIUM: 136 mmol/L (ref 135–145)
TOTAL PROTEIN: 6.4 g/dL — AB (ref 6.5–8.1)

## 2016-03-21 LAB — URINALYSIS, ROUTINE W REFLEX MICROSCOPIC
BILIRUBIN URINE: NEGATIVE
GLUCOSE, UA: NEGATIVE mg/dL
HGB URINE DIPSTICK: NEGATIVE
KETONES UR: NEGATIVE mg/dL
Leukocytes, UA: NEGATIVE
Nitrite: NEGATIVE
PROTEIN: NEGATIVE mg/dL
Specific Gravity, Urine: <1.005 — ABNORMAL LOW (ref 1.005–1.030)
pH: 6 (ref 5.0–8.0)

## 2016-03-21 LAB — LACTATE DEHYDROGENASE: LDH: 82 U/L — ABNORMAL LOW (ref 98–192)

## 2016-03-21 LAB — URIC ACID: Uric Acid, Serum: 4.3 mg/dL (ref 2.3–6.6)

## 2016-03-21 MED ORDER — MISOPROSTOL 25 MCG QUARTER TABLET
25.0000 ug | ORAL_TABLET | ORAL | Status: DC | PRN
  Administered 2016-03-21: 25 ug via VAGINAL
  Filled 2016-03-21: qty 0.25
  Filled 2016-03-21: qty 1

## 2016-03-21 MED ORDER — ACETAMINOPHEN 325 MG PO TABS
650.0000 mg | ORAL_TABLET | ORAL | Status: DC | PRN
Start: 2016-03-21 — End: 2016-03-22

## 2016-03-21 MED ORDER — TERBUTALINE SULFATE 1 MG/ML IJ SOLN
0.2500 mg | Freq: Once | INTRAMUSCULAR | Status: DC | PRN
  Filled 2016-03-21: qty 1

## 2016-03-21 MED ORDER — OXYCODONE-ACETAMINOPHEN 5-325 MG PO TABS: 2.0000 | ORAL_TABLET | ORAL | Status: DC | PRN

## 2016-03-21 MED ORDER — LACTATED RINGERS IV SOLN: 500.0000 mL | INTRAVENOUS | Status: DC | PRN

## 2016-03-21 MED ORDER — HYDRALAZINE HCL 20 MG/ML IJ SOLN: 10.0000 mg | Freq: Once | INTRAMUSCULAR | Status: DC | PRN

## 2016-03-21 MED ORDER — OXYCODONE-ACETAMINOPHEN 5-325 MG PO TABS: 1.0000 | ORAL_TABLET | ORAL | Status: DC | PRN

## 2016-03-21 MED ORDER — SOD CITRATE-CITRIC ACID 500-334 MG/5ML PO SOLN: 30.0000 mL | ORAL | Status: DC | PRN

## 2016-03-21 MED ORDER — OXYTOCIN 40 UNITS IN LACTATED RINGERS INFUSION - SIMPLE MED
1.0000 m[IU]/min | INTRAVENOUS | Status: DC
  Administered 2016-03-21: 2 m[IU]/min via INTRAVENOUS
  Filled 2016-03-21: qty 1000

## 2016-03-21 MED ORDER — LIDOCAINE HCL (PF) 1 % IJ SOLN
30.0000 mL | INTRAMUSCULAR | Status: DC | PRN
  Filled 2016-03-21: qty 30

## 2016-03-21 MED ORDER — OXYTOCIN BOLUS FROM INFUSION
500.0000 mL | INTRAVENOUS | Status: DC
  Administered 2016-03-22: 500 mL via INTRAVENOUS

## 2016-03-21 MED ORDER — OXYTOCIN 40 UNITS IN LACTATED RINGERS INFUSION - SIMPLE MED: 2.5000 [IU]/h | INTRAVENOUS | Status: DC

## 2016-03-21 MED ORDER — ONDANSETRON HCL 4 MG/2ML IJ SOLN: 4.0000 mg | Freq: Four times a day (QID) | INTRAMUSCULAR | Status: DC | PRN

## 2016-03-21 MED ORDER — LABETALOL HCL 5 MG/ML IV SOLN
20.0000 mg | INTRAVENOUS | Status: DC | PRN
  Filled 2016-03-21: qty 4

## 2016-03-21 MED ORDER — LACTATED RINGERS IV SOLN
INTRAVENOUS | Status: DC
  Administered 2016-03-21 – 2016-03-22 (×4): via INTRAVENOUS

## 2016-03-21 NOTE — Anesthesia Pain Management Evaluation Note (Signed)
  [facility]NA Pain Management Visit Note  Patient: Gabrielle Zavala, 30 y.o., female  "Hello I am a member of the anesthesia team at [facility]. We have an anesthesia team available at all times to provide care throughout the hospital, including epidural management and anesthesia for C-section. I don't know your plan for the delivery whether it a natural birth, water birth, IV sedation, nitrous supplementation, doula or epidural, but we want to meet your pain goals."   1.Was your pain managed to your expectations on prior hospitalizations?   Yes   2.What is your expectation for pain management during this hospitalization?     Epidural  3.How can we help you reach that goal? Epidural.  Record the patient's initial score and the patient's pain goal.   Pain: 0  Pain Goal: 7 [facility] wants you to be able to say your pain was always managed very well.  Gabrielle Zavala 03/21/2016

## 2016-03-21 NOTE — MAU Note (Signed)
Urine sent to lab 

## 2016-03-21 NOTE — H&P (Signed)
Pt is a 30 y/o white female, G2P0101 at [redacted] weeks EGA who is admitted for Preeclampsia with severe features. Pt was seen in the office this am for an NST. At the time her B/P was 166/102. A follow up B/P was 155/88. She had c/o of a headache earlier which had resolved. She was sent to the ER for evaluation and a repeat of her PIH labs. She was admitted for 24 hours this past weekend for elevated B/Ps. She was given steroids at that visit. In the ER the pt again had B/P readings in the severe range. She has no vision complaints/RUQ pain. She was induced at 35 wks with her last preg. This is a new FOB. Her PIH labs were wnl. She has 1+ proteinuria. Given the B/P readings in the severe range will admit for induction. D/w Dr. Sherrie Georgeecker.  PMHX: see Prenatal record PE: HEENT-wnl        Abd-gravid, non tender        Ext-1+ edema, no clonus        FHTs reactive IMP/ IUP at 36 wks with preeclampsia/ Severe features         S/P steroids Plan/Admit          Start cytotec.

## 2016-03-21 NOTE — MAU Note (Signed)
Pt sent over from office due to elevated blood pressure. Pt has been being followed with this pregnancy for increased blood pressures. Pt states baby is moving normally. Pt denies bleeding and leaking of fluid. Pt states she has had a consistent headache for several weeks that is the same today. Pt saw a few floaters while in the lobby.

## 2016-03-21 NOTE — MAU Provider Note (Signed)
History     CSN: [id number]  Arrival date and time: 03/21/16 1350   First Provider Initiated Contact with Patient 03/21/16 1446      Chief Complaint  Patient presents with  . Hypertension   HPI   [name] is a 30 y.o. female G2P1 @ 53w0dwith a history of PIH presenting to MAU for preeclampsia evaluation. She was seen in the office today and her pressures were elevated.  BP 160/100 in Dr. ATonette Biharioffice.  Delivered @ 35 weeks with last pregnancy due to preeclampsia; vaginal delivery.   Has been taking ASA daily. And received BMZ on 6/2 and 6/3  She denies changes in her vision currently; while in the lobby she saw "floaters" briefly.   She denies the need for pain medication at this time.    OB History    Gravida Para Term Preterm AB TAB SAB Ectopic Multiple Living   '2 1        1      ' Past Medical History  Diagnosis Date  . Anxiety   . Pre-eclampsia   . 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 Colposcopy  05/2011    lgsil  . Wisdom tooth extraction      Family History  Problem Relation Age of Onset  . Liver disease Mother   . Breast cancer Mother 643 . Diabetes Maternal Uncle     Social History  Substance Use Topics  . Smoking status: Never Smoker   . Smokeless tobacco: Never Used  . Alcohol Use: 0.0 oz/week     Comment: 3-4 times a week    Allergies: No Known Allergies  Prescriptions prior to admission  Medication Sig Dispense Refill Last Dose  . acetaminophen (TYLENOL) 325 MG tablet Take 650 mg by mouth every 6 (six) hours as needed for mild pain or headache.   Past Month at Unknown time  . aspirin 81 MG tablet Take 81 mg by mouth daily.   03/20/2016 at Unknown time  . butalbital-acetaminophen-caffeine (FIORICET, ESGIC) 50-325-40 MG tablet Take 1  tablet by mouth 2 (two) times daily as needed for headache.   03/20/2016 at Unknown time  . calcium carbonate (TUMS - DOSED IN MG ELEMENTAL CALCIUM) 500 MG chewable tablet Chew 1 tablet by mouth daily as needed for indigestion or heartburn.   03/20/2016 at Unknown time  . cyclobenzaprine (FLEXERIL) 5 MG tablet Take 5 mg by mouth 2 (two) times daily as needed for muscle spasms.   2 03/20/2016 at Unknown time  . oxyCODONE-acetaminophen (PERCOCET/ROXICET) 5-325 MG tablet Take 1 tablet by mouth every 4 (four) hours as needed for moderate pain or severe pain. 30 tablet 0 03/20/2016 at Unknown time  . Prenatal Vit-Fe Fumarate-FA (PRENATAL MULTIVITAMIN) TABS tablet Take 1 tablet by mouth daily at 12 noon.    03/21/2016 at Unknown time   Results for orders placed or performed during the hospital encounter of 03/21/16 (from the past 48 hour(s))  Urinalysis, [facility]ine w reflex microscopic [facility]     Status: Abnormal   Collection Time: 03/21/16  2:00 PM  Result Value Ref Range   Color, Urine YELLOW YELLOW   APPearance CLEAR CLEAR   Specific Gravity, Urine <1.005 (L) 1.005 - 1.030   pH 6.0 5.0 - 8.0  Glucose, UA NEGATIVE NEGATIVE mg/dL   Hgb urine dipstick NEGATIVE NEGATIVE   Bilirubin Urine NEGATIVE NEGATIVE   Ketones, ur NEGATIVE NEGATIVE mg/dL   Protein, ur NEGATIVE NEGATIVE mg/dL   Nitrite NEGATIVE NEGATIVE   Leukocytes, UA NEGATIVE NEGATIVE    Comment: MICROSCOPIC NOT DONE ON URINES WITH NEGATIVE PROTEIN, BLOOD, LEUKOCYTES, NITRITE, OR GLUCOSE <1000 mg/dL.  Protein / creatinine ratio, urine     Status: Abnormal   Collection Time: 03/21/16  2:00 PM  Result Value Ref Range   Creatinine, Urine 67.00 mg/dL   Total Protein, Urine 11 mg/dL    Comment: NO NORMAL RANGE ESTABLISHED FOR THIS TEST   Protein Creatinine Ratio 0.16 (H) 0.00 - 0.15 mg/mg[Cre]  CBC     Status: Abnormal   Collection Time: 03/21/16  2:36 PM  Result Value Ref Range   WBC 11.3 (H) 4.0 - 10.5 K/uL   RBC 4.25 3.87 - 5.11 MIL/uL    Hemoglobin 11.7 (L) 12.0 - 15.0 g/dL   HCT 36.0 36.0 - 46.0 %   MCV 84.7 78.0 - 100.0 fL   MCH 27.5 26.0 - 34.0 pg   MCHC 32.5 30.0 - 36.0 g/dL   RDW 15.2 11.5 - 15.5 %   Platelets 201 150 - 400 K/uL  Comprehensive metabolic panel     Status: Abnormal   Collection Time: 03/21/16  2:36 PM  Result Value Ref Range   Sodium 136 135 - 145 mmol/L   Potassium 3.5 3.5 - 5.1 mmol/L   Chloride 107 101 - 111 mmol/L   CO2 21 (L) 22 - 32 mmol/L   Glucose, Bld 79 65 - 99 mg/dL   BUN 11 6 - 20 mg/dL   Creatinine, Ser 0.50 0.44 - 1.00 mg/dL   Calcium 8.5 (L) 8.9 - 10.3 mg/dL   Total Protein 6.4 (L) 6.5 - 8.1 g/dL   Albumin 2.8 (L) 3.5 - 5.0 g/dL   AST 16 15 - 41 U/L   ALT 13 (L) 14 - 54 U/L   Alkaline Phosphatase 109 38 - 126 U/L   Total Bilirubin 0.3 0.3 - 1.2 mg/dL   GFR calc non Af Amer >60 >60 mL/min   GFR calc Af Amer >60 >60 mL/min    Comment: (NOTE) The eGFR has been calculated using the CKD EPI equation. This calculation has not been validated in all clinical situations. eGFR's persistently <60 mL/min signify possible Chronic Kidney Disease.    Anion gap 8 5 - 15  Uric acid     Status: None   Collection Time: 03/21/16  2:36 PM  Result Value Ref Range   Uric Acid, Serum 4.3 2.3 - 6.6 mg/dL  Lactate dehydrogenase     Status: Abnormal   Collection Time: 03/21/16  2:36 PM  Result Value Ref Range   LDH 82 (L) 98 - 192 U/L    Review of Systems  Constitutional: Negative for fever.  Eyes: Negative for blurred vision (None currently ).  Gastrointestinal: Negative for nausea, vomiting and abdominal pain.  Neurological: Negative for headaches.   Physical Exam   Blood pressure 142/107, pulse 85, temperature 98.7 F (37.1 C), temperature source Oral, resp. rate 18, height '5\' 6"'  (1.676 m), weight 205 lb (92.987 kg), last menstrual period 08/05/2015.   Patient Vitals for the past 24 hrs:  BP Temp Temp src Pulse Resp Height Weight  03/21/16 1630 157/96 mmHg - - 88 - - -  03/21/16  1621 (!) 161/111 mmHg - - 88 - - -  03/21/16 1545 149/98 mmHg - - 87 - - -  03/21/16 1530 167/97 mmHg - - 99 - - -  03/21/16 1520 - - - - 18 - -  03/21/16 1515 162/95 mmHg - - 83 - - -  03/21/16 1500 121/81 mmHg - - 91 - - -  03/21/16 1445 (!) 142/107 mmHg - - 85 - - -  03/21/16 1430 (!) 154/101 mmHg - - 88 - - -  03/21/16 1419 - - - - 17 - -  03/21/16 1416 146/99 mmHg - - 96 - - -  03/21/16 1412 (!) 156/102 mmHg 98.7 F (37.1 C) Oral 94 18 '5\' 6"'  (1.676 m) 205 lb (92.987 kg)    Physical Exam  Constitutional: She is oriented to person, place, and time. She appears well-developed and well-nourished. No distress.  HENT:  Head: Normocephalic.  Eyes: Pupils are equal, round, and reactive to light.  Neck: Neck supple.  Cardiovascular: Normal rate.   Respiratory: Effort normal.  GI: Soft. She exhibits no distension. There is no tenderness. There is no rebound.  Musculoskeletal: Normal range of motion. She exhibits no edema or tenderness.  Neurological: She is alert and oriented to person, place, and time. She has normal strength and normal reflexes. GCS eye subscore is 4. GCS verbal subscore is 5. GCS motor subscore is 6.  Negative clonus   Skin: Skin is warm. She is not diaphoretic.  Psychiatric: Her behavior is normal.    MAU Course  Procedures  None  MDM  Discussed VS, HPI, labs and physical exam with Dr. Ouida Sills. Dr. Ouida Sills to discussed and consult with MFM.   Admit per Dr. Ouida Sills Labetalol protocol initiated  Category 1 fetal tracing  Assessment and Plan    A:  1. Pre-eclampsia during pregnancy in third trimester, antepartum     P:  Admit to Labor and Delivery  IV established Cytotec  GBS unknown  RN to contact Dr. Ouida Sills for further orders.    Lezlie Lye, NP 03/21/2016 5:01 PM

## 2016-03-22 ENCOUNTER — Inpatient Hospital Stay (HOSPITAL_COMMUNITY): Payer: 59 | Admitting: Anesthesiology

## 2016-03-22 ENCOUNTER — Encounter (HOSPITAL_COMMUNITY): Payer: Self-pay | Admitting: General Surgery

## 2016-03-22 ENCOUNTER — Ambulatory Visit (HOSPITAL_COMMUNITY): Payer: 59

## 2016-03-22 LAB — OB RESULTS CONSOLE GBS: STREP GROUP B AG: NEGATIVE

## 2016-03-22 LAB — GROUP B STREP BY PCR: Group B strep by PCR: NEGATIVE

## 2016-03-22 LAB — RPR: RPR Ser Ql: NONREACTIVE

## 2016-03-22 MED ORDER — COCONUT OIL OIL: 1.0000 "application " | TOPICAL_OIL | Status: DC | PRN

## 2016-03-22 MED ORDER — EPHEDRINE 5 MG/ML INJ
10.0000 mg | INTRAVENOUS | Status: DC | PRN
  Filled 2016-03-22: qty 2

## 2016-03-22 MED ORDER — SIMETHICONE 80 MG PO CHEW: 80.0000 mg | CHEWABLE_TABLET | ORAL | Status: DC | PRN

## 2016-03-22 MED ORDER — ACETAMINOPHEN 325 MG PO TABS: 650.0000 mg | ORAL_TABLET | ORAL | Status: DC | PRN

## 2016-03-22 MED ORDER — BENZOCAINE-MENTHOL 20-0.5 % EX AERO
1.0000 "application " | INHALATION_SPRAY | CUTANEOUS | Status: DC | PRN
  Administered 2016-03-23: 1 via TOPICAL
  Filled 2016-03-22: qty 56

## 2016-03-22 MED ORDER — ONDANSETRON HCL 4 MG PO TABS: 4.0000 mg | ORAL_TABLET | ORAL | Status: DC | PRN

## 2016-03-22 MED ORDER — LIDOCAINE HCL (PF) 1 % IJ SOLN
INTRAMUSCULAR | Status: DC | PRN
  Administered 2016-03-22 (×2): 5 mL

## 2016-03-22 MED ORDER — FENTANYL 2.5 MCG/ML BUPIVACAINE 1/10 % EPIDURAL INFUSION (WH - ANES)
14.0000 mL/h | INTRAMUSCULAR | Status: DC | PRN
  Administered 2016-03-22 (×2): 14 mL/h via EPIDURAL
  Filled 2016-03-22 (×2): qty 125

## 2016-03-22 MED ORDER — WITCH HAZEL-GLYCERIN EX PADS: 1.0000 "application " | MEDICATED_PAD | CUTANEOUS | Status: DC | PRN

## 2016-03-22 MED ORDER — OXYCODONE-ACETAMINOPHEN 5-325 MG PO TABS
1.0000 | ORAL_TABLET | ORAL | Status: DC | PRN
  Administered 2016-03-23 – 2016-03-24 (×5): 1 via ORAL
  Filled 2016-03-22 (×5): qty 1

## 2016-03-22 MED ORDER — DIBUCAINE 1 % RE OINT: 1.0000 "application " | TOPICAL_OINTMENT | RECTAL | Status: DC | PRN

## 2016-03-22 MED ORDER — ZOLPIDEM TARTRATE 5 MG PO TABS: 5.0000 mg | ORAL_TABLET | Freq: Every evening | ORAL | Status: DC | PRN

## 2016-03-22 MED ORDER — SENNOSIDES-DOCUSATE SODIUM 8.6-50 MG PO TABS
2.0000 | ORAL_TABLET | ORAL | Status: DC
  Administered 2016-03-22 – 2016-03-24 (×3): 2 via ORAL
  Filled 2016-03-22 (×3): qty 2

## 2016-03-22 MED ORDER — TETANUS-DIPHTH-ACELL PERTUSSIS 5-2.5-18.5 LF-MCG/0.5 IM SUSP: 0.5000 mL | Freq: Once | INTRAMUSCULAR | Status: DC

## 2016-03-22 MED ORDER — DIPHENHYDRAMINE HCL 25 MG PO CAPS: 25.0000 mg | ORAL_CAPSULE | Freq: Four times a day (QID) | ORAL | Status: DC | PRN

## 2016-03-22 MED ORDER — BUTORPHANOL TARTRATE 1 MG/ML IJ SOLN
1.0000 mg | INTRAMUSCULAR | Status: DC | PRN
  Filled 2016-03-22: qty 1

## 2016-03-22 MED ORDER — PRENATAL MULTIVITAMIN CH
1.0000 | ORAL_TABLET | Freq: Every day | ORAL | Status: DC
  Administered 2016-03-23 – 2016-03-25 (×3): 1 via ORAL
  Filled 2016-03-22 (×3): qty 1

## 2016-03-22 MED ORDER — PHENYLEPHRINE 40 MCG/ML (10ML) SYRINGE FOR IV PUSH (FOR BLOOD PRESSURE SUPPORT)
80.0000 ug | PREFILLED_SYRINGE | INTRAVENOUS | Status: DC | PRN
  Filled 2016-03-22: qty 5
  Filled 2016-03-22: qty 10

## 2016-03-22 MED ORDER — IBUPROFEN 600 MG PO TABS
600.0000 mg | ORAL_TABLET | Freq: Four times a day (QID) | ORAL | Status: DC
  Administered 2016-03-22 – 2016-03-25 (×11): 600 mg via ORAL
  Filled 2016-03-22 (×11): qty 1

## 2016-03-22 MED ORDER — DIPHENHYDRAMINE HCL 50 MG/ML IJ SOLN: 12.5000 mg | INTRAMUSCULAR | Status: DC | PRN

## 2016-03-22 MED ORDER — PHENYLEPHRINE 40 MCG/ML (10ML) SYRINGE FOR IV PUSH (FOR BLOOD PRESSURE SUPPORT)
80.0000 ug | PREFILLED_SYRINGE | INTRAVENOUS | Status: DC | PRN
  Filled 2016-03-22: qty 5

## 2016-03-22 MED ORDER — ONDANSETRON HCL 4 MG/2ML IJ SOLN: 4.0000 mg | INTRAMUSCULAR | Status: DC | PRN

## 2016-03-22 MED ORDER — LACTATED RINGERS IV SOLN
500.0000 mL | Freq: Once | INTRAVENOUS | Status: AC
  Administered 2016-03-22: 500 mL via INTRAVENOUS

## 2016-03-22 NOTE — Anesthesia Procedure Notes (Signed)
Epidural Patient location during procedure: OB  Staffing Anesthesiologist: Alsie Younes Performed by: anesthesiologist   Preanesthetic Checklist Completed: patient identified, site marked, surgical consent, pre-op evaluation, timeout performed, IV checked, risks and benefits discussed and monitors and equipment checked  Epidural Patient position: sitting Prep: DuraPrep Patient monitoring: heart rate, continuous pulse ox and blood pressure Approach: right paramedian Location: L3-L4 Injection technique: LOR saline  Needle:  Needle type: Tuohy  Needle gauge: 17 G Needle length: 9 cm and 9 Needle insertion depth: 7 cm Catheter type: closed end flexible Catheter size: 20 Guage Catheter at skin depth: 11 cm Test dose: negative  Assessment Events: blood not aspirated, injection not painful, no injection resistance, negative IV test and no paresthesia  Additional Notes Patient identified. Risks/Benefits/Options discussed with patient including but not limited to bleeding, infection, nerve damage, paralysis, failed block, incomplete pain control, headache, blood pressure changes, nausea, vomiting, reactions to medication both or allergic, itching and postpartum back pain. Confirmed with bedside nurse the patient's most recent platelet count. Confirmed with patient that they are not currently taking any anticoagulation, have any bleeding history or any family history of bleeding disorders. Patient expressed understanding and wished to proceed. All questions were answered. Sterile technique was used throughout the entire procedure. Please see nursing notes for vital signs. Test dose was given through epidural needle and negative prior to continuing to dose epidural or start infusion. Warning signs of high block given to the patient including shortness of breath, tingling/numbness in hands, complete motor block, or any concerning symptoms with instructions to call for help. Patient was given  instructions on fall risk and not to get out of bed. All questions and concerns addressed with instructions to call with any issues.   

## 2016-03-22 NOTE — Anesthesia Preprocedure Evaluation (Signed)
Anesthesia Evaluation  Patient identified by MRN, date of birth, ID band Patient awake    Reviewed: Allergy & Precautions, H&P , NPO status , Patient's Chart, lab work & pertinent test results  History of Anesthesia Complications Negative for: history of anesthetic complications  Airway Mallampati: II  TM Distance: >3 FB Neck ROM: full    Dental no notable dental hx. (+) Teeth Intact   Pulmonary neg pulmonary ROS,    Pulmonary exam normal breath sounds clear to auscultation       Cardiovascular hypertension, Normal cardiovascular exam Rhythm:regular Rate:Normal     Neuro/Psych negative neurological ROS  negative psych ROS   GI/Hepatic negative GI ROS, Neg liver ROS,   Endo/Other  negative endocrine ROS  Renal/GU negative Renal ROS  negative genitourinary   Musculoskeletal   Abdominal   Peds  Hematology negative hematology ROS (+)   Anesthesia Other Findings   Reproductive/Obstetrics (+) Pregnancy                             Anesthesia Physical Anesthesia Plan  ASA: II  Anesthesia Plan: Epidural   Post-op Pain Management:    Induction:   Airway Management Planned:   Additional Equipment:   Intra-op Plan:   Post-operative Plan:   Informed Consent: I have reviewed the patients History and Physical, chart, labs and discussed the procedure including the risks, benefits and alternatives for the proposed anesthesia with the patient or authorized representative who has indicated his/her understanding and acceptance.     Plan Discussed with:   Anesthesia Plan Comments:         Anesthesia Quick Evaluation

## 2016-03-23 LAB — COMPREHENSIVE METABOLIC PANEL
ALT: 14 U/L (ref 14–54)
AST: 19 U/L (ref 15–41)
Albumin: 2.8 g/dL — ABNORMAL LOW (ref 3.5–5.0)
Alkaline Phosphatase: 100 U/L (ref 38–126)
Anion gap: 7 (ref 5–15)
BUN: 10 mg/dL (ref 6–20)
CHLORIDE: 106 mmol/L (ref 101–111)
CO2: 24 mmol/L (ref 22–32)
CREATININE: 0.68 mg/dL (ref 0.44–1.00)
Calcium: 9.2 mg/dL (ref 8.9–10.3)
Glucose, Bld: 79 mg/dL (ref 65–99)
POTASSIUM: 4.1 mmol/L (ref 3.5–5.1)
Sodium: 137 mmol/L (ref 135–145)
Total Bilirubin: 0.3 mg/dL (ref 0.3–1.2)
Total Protein: 6.4 g/dL — ABNORMAL LOW (ref 6.5–8.1)

## 2016-03-23 LAB — CBC
HCT: 36.1 % (ref 36.0–46.0)
HEMATOCRIT: 36 % (ref 36.0–46.0)
HEMOGLOBIN: 11.9 g/dL — AB (ref 12.0–15.0)
Hemoglobin: 11.8 g/dL — ABNORMAL LOW (ref 12.0–15.0)
MCH: 27.7 pg (ref 26.0–34.0)
MCH: 27.5 pg (ref 26.0–34.0)
MCHC: 33.1 g/dL (ref 30.0–36.0)
MCHC: 32.7 g/dL (ref 30.0–36.0)
MCV: 83.7 fL (ref 78.0–100.0)
MCV: 84.1 fL (ref 78.0–100.0)
PLATELETS: 190 10*3/uL (ref 150–400)
Platelets: 178 10*3/uL (ref 150–400)
RBC: 4.3 MIL/uL (ref 3.87–5.11)
RBC: 4.29 MIL/uL (ref 3.87–5.11)
RDW: 15.1 % (ref 11.5–15.5)
RDW: 15.1 % (ref 11.5–15.5)
WBC: 12.3 10*3/uL — AB (ref 4.0–10.5)
WBC: 11 10*3/uL — ABNORMAL HIGH (ref 4.0–10.5)

## 2016-03-23 LAB — URIC ACID: URIC ACID, SERUM: 4.3 mg/dL (ref 2.3–6.6)

## 2016-03-23 LAB — LACTATE DEHYDROGENASE: LDH: 141 U/L (ref 98–192)

## 2016-03-23 MED ORDER — NIFEDIPINE ER OSMOTIC RELEASE 30 MG PO TB24
30.0000 mg | ORAL_TABLET | Freq: Every day | ORAL | Status: DC
  Administered 2016-03-23 – 2016-03-25 (×3): 30 mg via ORAL
  Filled 2016-03-23 (×3): qty 1

## 2016-03-23 MED ORDER — MAGNESIUM SULFATE 50 % IJ SOLN
2.0000 g/h | INTRAVENOUS | Status: DC
  Filled 2016-03-23: qty 80

## 2016-03-23 MED ORDER — ACETAMINOPHEN 325 MG PO TABS: 650.0000 mg | ORAL_TABLET | Freq: Four times a day (QID) | ORAL | Status: DC | PRN

## 2016-03-23 MED ORDER — MAGNESIUM SULFATE BOLUS VIA INFUSION
4.0000 g | Freq: Once | INTRAVENOUS | Status: AC
  Administered 2016-03-23: 4 g via INTRAVENOUS
  Filled 2016-03-23: qty 500

## 2016-03-23 NOTE — Progress Notes (Addendum)
Pts VS checked at 1809 and BP 133/95 HR 75 declines PIH symptoms. BP rechecked during shift change report @ 1919 and is noted to be 163/105 & 161/104 HR 69, pt now c/o headache. Dr. Mora ApplPinn called and made aware of elevated BP, see orders.

## 2016-03-23 NOTE — Progress Notes (Addendum)
Post Partum Day 1 Subjective: up ad lib, voiding, tolerating PO, + flatus and having some abdominal cramping. She is pumping  Objective: Blood pressure 133/86, pulse 74, temperature 98.4 F (36.9 C), temperature source Oral, resp. rate 18, height 5\' 6"  (1.676 m), weight 92.987 kg (205 lb), last menstrual period 08/05/2015, unknown if currently breastfeeding.  Physical Exam:  General: alert, cooperative and no distress Lochia: appropriate Uterine Fundus: firm Neuro: 2+DTRs DVT Evaluation: No evidence of DVT seen on physical exam. Negative Homan's sign. No cords or calf tenderness.   Recent Labs  03/21/16 1436 03/23/16 0513  HGB 11.7* 11.9*  HCT 36.0 36.0    Assessment/Plan: Plan for discharge tomorrow and Breastfeeding  Will watch BPs closely Patient given PP preeclampsia precautions.    LOS: 2 days   Gabrielle Zavala STACIA 03/23/2016, 8:59 AM

## 2016-03-23 NOTE — Progress Notes (Signed)
Patient ID: Gabrielle Zavala, female   DOB: [birth year], 30 y.o.   MRN: [id number]  Called by RN on Post partum floor.  Patient c/o severe headaches.  Her systolic BPs are in the 160's and DBP 104-105.  As she was delivered d/t pre-eclampsia, will take precaution and transfer her to AICU for magnesium sulfate seizure Prophylaxis.  Will check stat Pre-E labs and start Procardia 30 mg daily.   Will closely monitor.   Kairyn Olmeda STACIA

## 2016-03-23 NOTE — Anesthesia Postprocedure Evaluation (Signed)
Anesthesia Post Note  Patient: Gabrielle Zavala  Procedure(s) Performed: * No procedures listed *  Patient location during evaluation: Mother Baby Anesthesia Type: Epidural Level of consciousness: awake and alert Pain management: pain level controlled Vital Signs Assessment: post-procedure vital signs reviewed and stable Respiratory status: spontaneous breathing, nonlabored ventilation and respiratory function stable Cardiovascular status: stable Postop Assessment: no headache, no backache and epidural receding Anesthetic complications: no     Last Vitals:  Filed Vitals:   03/23/16 0245 03/23/16 0655  BP: 137/89 133/86  Pulse: 69 74  Temp: 36.7 C 36.9 C  Resp: 18 18    Last Pain:  Filed Vitals:   03/23/16 0656  PainSc: 3    Pain Goal: Patients Stated Pain Goal: 7 (03/22/16 0731)               Junious SilkGILBERT,Haru Anspaugh

## 2016-03-23 NOTE — Progress Notes (Signed)
MOB was referred for history of depression/anxiety. * Referral screened out by Clinical Social Worker because none of the following criteria appear to apply: ~ History of anxiety/depression during this pregnancy, or of post-partum depression. ~ Diagnosis of anxiety and/or depression within last 3 years OR * MOB's symptoms currently being treated with medication and/or therapy. Please contact the Clinical Social Worker if needs arise, or if MOB requests.  Diagnosis noted in 2012.  PNR notes Rx for Xanax PRN. 

## 2016-03-23 NOTE — Lactation Note (Signed)
This note was copied from a baby's chart. Lactation Consultation Note: Infant is 7118 hours old and is 36-[redacted] weeks gestation. Infant is at 4 % weight loss this am. Late Preterm infant parent instruction sheet given . Infant latched on the left breast in football hold. Observed a shallow latch. Mother taught to tug on infants lower jaw. Observed infant with intermittent swallows. Infant was given 5 ml of Alimentum with a curved tip syringe at the breast. Reviewed supplemental  guidelines . Advised to offer supplement every 2-3 hours with feedings. Mother has been sat up with a DEBP by staff nurse. Mother is aware of need to post pump to protect her milk supply. Mother receptive to all teaching.   Patient Name: Gabrielle Zavala Today's Date: 03/23/2016 Reason for consult: Initial assessment   Maternal Data    Feeding Feeding Type: Breast Fed Length of feed: 30 min  LATCH Score/Interventions Latch: Grasps breast easily, tongue down, lips flanged, rhythmical sucking.  Audible Swallowing: A few with stimulation Intervention(s): Hand expression Intervention(s): Skin to skin;Hand expression  Type of Nipple: Everted at rest and after stimulation  Comfort (Breast/Nipple): Soft / non-tender     Hold (Positioning): Assistance needed to correctly position infant at breast and maintain latch. Intervention(s): Breastfeeding basics reviewed;Support Pillows;Position options;Skin to skin  LATCH Score: 8  Lactation Tools Discussed/Used     Consult Status Consult Status: Follow-up Date: 03/23/16 Follow-up type: In-patient    Gabrielle Zavala, Gabrielle Kuechle [name] 03/23/2016, 3:03 PM

## 2016-03-24 MED ORDER — LACTATED RINGERS IV SOLN
INTRAVENOUS | Status: DC
  Administered 2016-03-23 – 2016-03-24 (×2): via INTRAVENOUS

## 2016-03-24 NOTE — Lactation Note (Signed)
This note was copied from a baby's chart. Lactation Consultation Note: Mother had infant in traditional cradle hold when I arrived in room for consult. Mother has a positional strip on the (R) nipple. Advised mother to be aware when infant is shallow and only feeding on the tip of the nipple. Observed that infant was latched on with a shallow latch at this time. Mother has been very sleepy and tired. Assist with deeper latch. Mother is supplementing infant after feedings. Mother denies having concerns or questions. Mother has pump packet for 2 week rental. She states that her insurance will provide a pump but will take a week or more for pump to arrive.   Patient Name: Gabrielle Zavala ZOXWR'UToday's Date: 03/24/2016 Reason for consult: Follow-up assessment   Maternal Data    Feeding Feeding Type: Breast Fed Length of feed: 15 min  LATCH Score/Interventions Latch: Grasps breast easily, tongue down, lips flanged, rhythmical sucking.  Audible Swallowing: A few with stimulation Intervention(s): Skin to skin;Hand expression Intervention(s): Skin to skin;Hand expression  Type of Nipple: Everted at rest and after stimulation  Comfort (Breast/Nipple): Filling, red/small blisters or bruises, mild/mod discomfort  Problem noted: Mild/Moderate discomfort (positional stripes) Interventions (Mild/moderate discomfort): Hand massage;Hand expression  Hold (Positioning): Assistance needed to correctly position infant at breast and maintain latch. Intervention(s): Breastfeeding basics reviewed;Support Pillows;Position options;Skin to skin  LATCH Score: 7  Lactation Tools Discussed/Used     Consult Status      Michel BickersKendrick, Gabrielle Zavala 03/24/2016, 1:53 PM

## 2016-03-24 NOTE — Lactation Note (Signed)
This note was copied from a baby's chart. Lactation Consultation Note  Patient Name: Gabrielle Zavala FAOZH'YToday's Date: 03/24/2016 Reason for consult: Follow-up assessment;Late preterm infant  I was informed by RN that parents had been using a curved-tip syringe at the breast to supplement & that Mom was interested in using an SNS, as she had done w/her 1st child (born @ 35 weeks). Mom shared that infant had been latching well w/supplement.   In light of likely need for longer than 1-2 days, a double SNS was initiated. Using the SNS was not successful either at the breast or with finger-feeding. Infant was supplemented at breast w/curved-tip syringe and took about 13mL (infant only took slightly more when finger-feeding offered).  Parents aware that feeding volumes should be more. I encouraged them to continue using the curved-tip syringe at the breast & if it became too cumbersome or if infant was tiring too quickly, to use bottles, instead. Parents verbalized understanding.   Hand expression taught to Mom. Use of double SNS can be revisited at a later time.   Lurline HareRichey, Bryden Darden [facility] 03/24/2016, 11:22 PM

## 2016-03-24 NOTE — Progress Notes (Signed)
Patient is eating, ambulating, voiding.  Pain control is good.  Filed Vitals:   03/24/16 0400 03/24/16 0500 03/24/16 0600 03/24/16 0705  BP: 119/72 129/83 122/71 125/81  Pulse: 74 81 83 85  Temp: 98.5 F (36.9 C)     TempSrc: Oral     Resp: 18 16 14 16   Height:      Weight:      SpO2:        Fundus firm Perineum without swelling.  Results for orders placed or performed during the hospital encounter of 03/21/16 (from the past 24 hour(s))  CBC     Status: Abnormal   Collection Time: 03/23/16  7:43 PM  Result Value Ref Range   WBC 11.0 (H) 4.0 - 10.5 K/uL   RBC 4.29 3.87 - 5.11 MIL/uL   Hemoglobin 11.8 (L) 12.0 - 15.0 g/dL   HCT 16.136.1 09.636.0 - 04.546.0 %   MCV 84.1 78.0 - 100.0 fL   MCH 27.5 26.0 - 34.0 pg   MCHC 32.7 30.0 - 36.0 g/dL   RDW 40.915.1 81.111.5 - 91.415.5 %   Platelets 190 150 - 400 K/uL  Comprehensive metabolic panel     Status: Abnormal   Collection Time: 03/23/16  7:43 PM  Result Value Ref Range   Sodium 137 135 - 145 mmol/L   Potassium 4.1 3.5 - 5.1 mmol/L   Chloride 106 101 - 111 mmol/L   CO2 24 22 - 32 mmol/L   Glucose, Bld 79 65 - 99 mg/dL   BUN 10 6 - 20 mg/dL   Creatinine, Ser 7.820.68 0.44 - 1.00 mg/dL   Calcium 9.2 8.9 - 95.610.3 mg/dL   Total Protein 6.4 (L) 6.5 - 8.1 g/dL   Albumin 2.8 (L) 3.5 - 5.0 g/dL   AST 19 15 - 41 U/L   ALT 14 14 - 54 U/L   Alkaline Phosphatase 100 38 - 126 U/L   Total Bilirubin 0.3 0.3 - 1.2 mg/dL   GFR calc non Af Amer >60 >60 mL/min   GFR calc Af Amer >60 >60 mL/min   Anion gap 7 5 - 15  Uric acid     Status: None   Collection Time: 03/23/16  7:43 PM  Result Value Ref Range   Uric Acid, Serum 4.3 2.3 - 6.6 mg/dL  Lactate dehydrogenase     Status: None   Collection Time: 03/23/16  7:43 PM  Result Value Ref Range   LDH 141 98 - 192 U/L    --/--/A POS (06/05 1436)/RI  A/P Post partum day 2- post partum preeclampsia with severe features. Pt started on Procardia and Magnesium Sulfate yesterday.  Pt feeling better today and BPs are  stable this am.   Will stop Magnesium at lunch time an observe BPs on Procardia. Expect d/c tomorrow.    Aaliah Jorgenson A

## 2016-03-25 MED ORDER — NIFEDIPINE ER 30 MG PO TB24: 30.0000 mg | ORAL_TABLET | Freq: Every day | ORAL | Status: DC

## 2016-03-25 MED ORDER — OXYCODONE-ACETAMINOPHEN 5-325 MG PO TABS: 1.0000 | ORAL_TABLET | ORAL | Status: DC | PRN

## 2016-03-25 MED ORDER — IBUPROFEN 600 MG PO TABS: 600.0000 mg | ORAL_TABLET | Freq: Four times a day (QID) | ORAL | Status: DC | PRN

## 2016-03-25 NOTE — Discharge Summary (Signed)
Obstetric Discharge Summary Reason for Admission: induction of labor Prenatal Procedures: NST and ultrasound Intrapartum Procedures: spontaneous vaginal delivery Postpartum Procedures: none Complications-Operative and Postpartum: none HEMOGLOBIN  Date Value Ref Range Status  03/23/2016 11.8* 12.0 - 15.0 g/dL Final   HCT  Date Value Ref Range Status  03/23/2016 36.1 36.0 - 46.0 % Final    Physical Exam:  General: alert, cooperative and appears stated age 26Lochia: appropriate Uterine Fundus: firm   Discharge Diagnoses: Term Pregnancy-delivered and Preelampsia  Discharge Information: Date: 03/25/2016 Activity: pelvic rest Diet: routine Medications: Ibuprofen, Percocet and Nifedipine 30mg  XL Condition: improved Instructions: refer to practice specific booklet Discharge to: home Follow-up Information    Follow up with CALLAHAN, SIDNEY, DO In 1 week.   Specialty:  Obstetrics and Gynecology   Why:  For a blood pressure check   Contact information:   [address] [place] [address] [phone]       Newborn Data: Live born female  Birth Weight: 6 lb 7.5 oz (2935 g) APGAR: 9, 9  Home with mother.  Gabrielle Lavoy H. 03/25/2016, 9:22 AM

## 2016-03-25 NOTE — Lactation Note (Signed)
This note was copied from a baby's chart. Lactation Consultation Note  Follow up visit made prior to discharge.  Mom states feedings didn't go as well last night so they started supplementation with bottle.  Baby is at a 9% weight loss.  Mom continues to attempt latch and then post pumps.  She has started to obtain small amounts.  Reviewed late preterm feeding norm and reassured.  2 week symphony pump rental completed.  Instructed to continue to offer breast with feeding cues, post pump every 3 hours and supplement with formula/expressed milk as day of life guidelines.  Reviewed outpatient services and support and encouraged follow up.  Discharge instructions completed including engorgement treatment.  Feeding diaries given.  Patient Name: Gabrielle Zavala Today's Date: 03/25/2016     Maternal Data    Feeding Feeding Type: Breast Fed  LATCH Score/Interventions Latch: Repeated attempts needed to sustain latch, nipple held in mouth throughout feeding, stimulation needed to elicit sucking reflex. Intervention(s): Adjust position;Assist with latch  Audible Swallowing: A few with stimulation Intervention(s): Skin to skin  Type of Nipple: Everted at rest and after stimulation  Comfort (Breast/Nipple): Soft / non-tender  Problem noted: Mild/Moderate discomfort  Hold (Positioning): No assistance needed to correctly position infant at breast. Intervention(s): Breastfeeding basics reviewed;Support Pillows;Position options;Skin to skin  LATCH Score: 8  Lactation Tools Discussed/Used     Consult Status      Huston FoleyMOULDEN, Hazaiah [place] S 03/25/2016, 10:27 AM

## 2016-04-04 ENCOUNTER — Ambulatory Visit (HOSPITAL_COMMUNITY)
Admission: RE | Admit: 2016-04-04 | Discharge: 2016-04-04 | Disposition: A | Discharge status: Home / Self Care | Payer: 59 | Source: Ambulatory Visit | Attending: Obstetrics and Gynecology | Admitting: Obstetrics and Gynecology

## 2016-04-04 NOTE — Lactation Note (Signed)
Lactation Consult  Mother's reason for visit:  Follow up LPI Visit Type:  Outpatient Appointment Notes:  Pecola LeisureBaby is now 4613 days old and approx 37 weeks 6 days.  Baby sleepy at the breast and transferred 26 ml during bilateral feeding.  Mother is pumping 2 times per day for 10-15 min and giving supplemental formula approx 50-60 ml 2 times a day in addition.  Baby is stooling once per day with numerous voids.   Plan:  Undress baby for feedings. Hand express before feedings.   Breastfeed on both breasts per feeding if possible. Supplement with breastmilk and formula after feedings.  Goal is 60-75 ml per feeding moving towards 90 ml. Hold baby upright for 10-15 min after feedings. Post pump 10-15 min at least 4-6 times a day both breasts with double electric breast pump. Give baby volume pumped with the difference in formula at each feeding. Recommend fenugreek supplements to increase milk supply.  Consult:  Initial Lactation Consultant:  Hardie PulleyBerkelhammer, Catheryn Slifer Boschen  ________________________________________________________________________ Gabrielle FloresBaby's Name: Gabrielle MayoLayla Elizabeth Zavala Date of Birth: 03/22/2014 Pediatrician: Earlene Plateravis Gender: female Gestational Age: 3648w1d (At Birth) Birth Weight: 6 lb 7.5 oz (2935 g) Weight at Discharge: Weight: 5 lb 14.4 oz (2675 g)Date of Discharge: 03/25/2016 Filed Weights   03/23/16 0615 03/23/16 2345 03/24/16 2313  Weight: 6 lb 3.1 oz (2810 g) 5 lb 15.8 oz (2715 g) 5 lb 14.4 oz (2675 g)   [facility] 6 [facility] .5 oz Location:Pediatrician's office Weight today: 6 lb 8.1 ounces        ________________________________________________________________________  Mother's Name: Gabrielle Zavala Type of delivery:   Breastfeeding Experience:  2nd child Maternal Medical Conditions:  Pregnancy induced hypertension Maternal Medications:  BP medication ( mother cannot remember  name)  ________________________________________________________________________  Breastfeeding History (Post Discharge)  Frequency of breastfeeding:  About every 2 hours Duration of feeding:  15 min  Supplementation  Formula:  Volume 50-60 ml Frequency:  2 x per day otal volume per day:  100-120 ml       Brand: Similac  Breastmilk:  Volume 25-6530ml Frequency:  2 timesTotal volume per day:  50-60 ml  Method:  Bottle,   Pumping  Type of pump:  Medela pump in style Frequency:  2 Volume:  25-30 ml  Infant Intake and Output Assessment  Voids:  8-10 in 24 hrs.  Color:  Clear yellow Stools:  1 in 24 hrs.  ________________________________________________________________________  Maternal Breast Assessment  Breast:  Soft Nipple:  Erect Pain level:  0  ______________________________________________________________________ Feeding Assessment/Evaluation  Initial feeding assessment:  Infant's oral assessment:  WNL  Positioning:  Cross cradle Left breast  LATCH documentation:  Latch:  2 = Grasps breast easily, tongue down, lips flanged, rhythmical sucking.  Audible swallowing:  1 = A few with stimulation  Type of nipple:  2 = Everted at rest and after stimulation  Comfort (Breast/Nipple):  2 = Soft / non-tender  Hold (Positioning):  1 = Assistance needed to correctly position infant at breast and maintain latch  LATCH score:  8  Attached assessment:  Shallow  Lips flanged:  Yes.    Lips untucked:  Yes.    Suck assessment:  Displays both  Pre-feed weight:  2952 g  Post-feed weight:  2970 g  Amount transferred:  18 ml  Additional Feeding Assessment -   Infant's oral assessment:  WNL  Positioning:  Cross cradle Left breast  LATCH documentation:  Latch:  1 = Repeated  attempts needed to sustain latch, nipple held in mouth throughout feeding, stimulation needed to elicit sucking reflex.  Audible swallowing:  1 = A few with stimulation  Type of nipple:  2 =  Everted at rest and after stimulation  Comfort (Breast/Nipple):  2 = Soft / non-tender  Hold (Positioning):  2 = No assistance needed to correctly position infant at breast  LATCH score:  8  Attached assessment:  Shallow  Lips flanged:  Yes.    Lips untucked:  Yes.    Suck assessment:  Displays both  Pre-feed weight:  2970 g   Post-feed weight:  2978 g Amount transferred:  8 ml Amount supplemented:  50 ml  Total amount transferred:  26 ml Total supplement given:  50 ml

## 2016-05-02 ENCOUNTER — Encounter: Payer: Self-pay | Admitting: Physician Assistant

## 2016-08-30 ENCOUNTER — Other Ambulatory Visit: Payer: Self-pay | Admitting: Obstetrics and Gynecology

## 2016-10-17 NOTE — L&D Delivery Note (Signed)
Patient was C/C/+2 and pushed for 7 minutes with epidural.   NSVD  female infant, Apgars 8,0, weight P.    A less than 30 second shoulder dystocia was relieved with suprapubic pressure and McRoberts;  Baby moving arms well post birth.   The patient had no lacerations. Fundus was firm. EBL was expected amount. Placenta was delivered intact. Vagina was clear.  Baby was vigorous and doing skin to skin with mother.  Nastacia Raybuck A

## 2017-03-08 ENCOUNTER — Other Ambulatory Visit: Payer: Self-pay | Admitting: Obstetrics and Gynecology

## 2017-03-10 LAB — CYTOLOGY - PAP

## 2017-04-06 LAB — OB RESULTS CONSOLE ANTIBODY SCREEN: ANTIBODY SCREEN: NEGATIVE

## 2017-04-06 LAB — OB RESULTS CONSOLE HIV ANTIBODY (ROUTINE TESTING): HIV: NONREACTIVE

## 2017-04-06 LAB — OB RESULTS CONSOLE GC/CHLAMYDIA
Chlamydia: NEGATIVE
Gonorrhea: NEGATIVE

## 2017-04-06 LAB — OB RESULTS CONSOLE ABO/RH: RH TYPE: POSITIVE

## 2017-04-06 LAB — OB RESULTS CONSOLE RUBELLA ANTIBODY, IGM: Rubella: IMMUNE

## 2017-04-06 LAB — OB RESULTS CONSOLE HEPATITIS B SURFACE ANTIGEN: HEP B S AG: NEGATIVE

## 2017-04-06 LAB — OB RESULTS CONSOLE RPR: RPR: NONREACTIVE

## 2017-05-25 ENCOUNTER — Other Ambulatory Visit: Payer: Self-pay | Admitting: Internal Medicine

## 2017-05-25 ENCOUNTER — Ambulatory Visit (HOSPITAL_COMMUNITY): Payer: 59 | Attending: Cardiology

## 2017-05-25 ENCOUNTER — Other Ambulatory Visit: Payer: Self-pay

## 2017-05-25 ENCOUNTER — Ambulatory Visit (INDEPENDENT_AMBULATORY_CARE_PROVIDER_SITE_OTHER): Payer: 59

## 2017-05-25 DIAGNOSIS — R002 Palpitations: Secondary | ICD-10-CM

## 2017-07-28 ENCOUNTER — Encounter (HOSPITAL_COMMUNITY): Payer: Self-pay

## 2017-07-28 ENCOUNTER — Inpatient Hospital Stay (HOSPITAL_COMMUNITY)
Admission: AD | Admit: 2017-07-28 | Discharge: 2017-07-28 | Disposition: A | Discharge status: Home / Self Care | Payer: 59 | Source: Ambulatory Visit | Attending: Obstetrics and Gynecology | Admitting: Obstetrics and Gynecology

## 2017-07-28 DIAGNOSIS — R1031 Right lower quadrant pain: Secondary | ICD-10-CM | POA: Diagnosis not present

## 2017-07-28 DIAGNOSIS — Z79899 Other long term (current) drug therapy: Secondary | ICD-10-CM | POA: Diagnosis not present

## 2017-07-28 DIAGNOSIS — R109 Unspecified abdominal pain: Secondary | ICD-10-CM | POA: Diagnosis not present

## 2017-07-28 DIAGNOSIS — R040 Epistaxis: Secondary | ICD-10-CM

## 2017-07-28 DIAGNOSIS — R1011 Right upper quadrant pain: Secondary | ICD-10-CM | POA: Diagnosis not present

## 2017-07-28 DIAGNOSIS — O26892 Other specified pregnancy related conditions, second trimester: Secondary | ICD-10-CM | POA: Diagnosis not present

## 2017-07-28 DIAGNOSIS — Z3A26 26 weeks gestation of pregnancy: Secondary | ICD-10-CM | POA: Diagnosis not present

## 2017-07-28 DIAGNOSIS — F419 Anxiety disorder, unspecified: Secondary | ICD-10-CM | POA: Insufficient documentation

## 2017-07-28 DIAGNOSIS — O99342 Other mental disorders complicating pregnancy, second trimester: Secondary | ICD-10-CM | POA: Insufficient documentation

## 2017-07-28 LAB — COMPREHENSIVE METABOLIC PANEL
ALBUMIN: 2.9 g/dL — AB (ref 3.5–5.0)
ALT: 15 U/L (ref 14–54)
AST: 14 U/L — AB (ref 15–41)
Alkaline Phosphatase: 68 U/L (ref 38–126)
Anion gap: 6 (ref 5–15)
BUN: 8 mg/dL (ref 6–20)
CHLORIDE: 106 mmol/L (ref 101–111)
CO2: 25 mmol/L (ref 22–32)
Calcium: 9.3 mg/dL (ref 8.9–10.3)
Creatinine, Ser: 0.54 mg/dL (ref 0.44–1.00)
GFR calc Af Amer: >60 mL/min (ref >60)
GFR calc non Af Amer: >60 mL/min (ref >60)
GLUCOSE: 89 mg/dL (ref 65–99)
POTASSIUM: 4.1 mmol/L (ref 3.5–5.1)
SODIUM: 137 mmol/L (ref 135–145)
Total Bilirubin: 0.3 mg/dL (ref 0.3–1.2)
Total Protein: 6.7 g/dL (ref 6.5–8.1)

## 2017-07-28 LAB — URINALYSIS, ROUTINE W REFLEX MICROSCOPIC
BILIRUBIN URINE: NEGATIVE
Glucose, UA: NEGATIVE mg/dL
HGB URINE DIPSTICK: NEGATIVE
KETONES UR: NEGATIVE mg/dL
Leukocytes, UA: NEGATIVE
Nitrite: NEGATIVE
PROTEIN: NEGATIVE mg/dL
SPECIFIC GRAVITY, URINE: 1.002 — AB (ref 1.005–1.030)
pH: 6 (ref 5.0–8.0)

## 2017-07-28 LAB — CBC
HCT: 35 % — ABNORMAL LOW (ref 36.0–46.0)
Hemoglobin: 11.2 g/dL — ABNORMAL LOW (ref 12.0–15.0)
MCH: 27.9 pg (ref 26.0–34.0)
MCHC: 32 g/dL (ref 30.0–36.0)
MCV: 87.1 fL (ref 78.0–100.0)
PLATELETS: 217 10*3/uL (ref 150–400)
RBC: 4.02 MIL/uL (ref 3.87–5.11)
RDW: 14.1 % (ref 11.5–15.5)
WBC: 9.7 10*3/uL (ref 4.0–10.5)

## 2017-07-28 LAB — LIPASE, BLOOD: Lipase: 20 U/L (ref 11–51)

## 2017-07-28 LAB — AMYLASE: Amylase: 56 U/L (ref 28–100)

## 2017-07-28 NOTE — Discharge Instructions (Signed)
Third Trimester of Pregnancy The third trimester is from week 28 through week 40 (months 7 through 9). The third trimester is a time when the unborn baby (fetus) is growing rapidly. At the end of the ninth month, the fetus is about 20 inches in length and weighs 6-10 pounds. Body changes during your third trimester Your body will continue to go through many changes during pregnancy. The changes vary from woman to woman. During the third trimester:  Your weight will continue to increase. You can expect to gain 25-35 pounds (11-16 kg) by the end of the pregnancy.  You may begin to get stretch marks on your hips, abdomen, and breasts.  You may urinate more often because the fetus is moving lower into your pelvis and pressing on your bladder.  You may develop or continue to have heartburn. This is caused by increased hormones that slow down muscles in the digestive tract.  You may develop or continue to have constipation because increased hormones slow digestion and cause the muscles that push waste through your intestines to relax.  You may develop hemorrhoids. These are swollen veins (varicose veins) in the rectum that can itch or be painful.  You may develop swollen, bulging veins (varicose veins) in your legs.  You may have increased body aches in the pelvis, back, or thighs. This is due to weight gain and increased hormones that are relaxing your joints.  You may have changes in your hair. These can include thickening of your hair, rapid growth, and changes in texture. Some women also have hair loss during or after pregnancy, or hair that feels dry or thin. Your hair will most likely return to normal after your baby is born.  Your breasts will continue to grow and they will continue to become tender. A yellow fluid (colostrum) may leak from your breasts. This is the first milk you are producing for your baby.  Your belly button may stick out.  You may notice more swelling in your hands,  face, or ankles.  You may have increased tingling or numbness in your hands, arms, and legs. The skin on your belly may also feel numb.  You may feel short of breath because of your expanding uterus.  You may have more problems sleeping. This can be caused by the size of your belly, increased need to urinate, and an increase in your body's metabolism.  You may notice the fetus "dropping," or moving lower in your abdomen (lightening).  You may have increased vaginal discharge.  You may notice your joints feel loose and you may have pain around your pelvic bone.  What to expect at prenatal visits You will have prenatal exams every 2 weeks until week 36. Then you will have weekly prenatal exams. During a routine prenatal visit:  You will be weighed to make sure you and the baby are growing normally.  Your blood pressure will be taken.  Your abdomen will be measured to track your baby's growth.  The fetal heartbeat will be listened to.  Any test results from the previous visit will be discussed.  You may have a cervical check near your due date to see if your cervix has softened or thinned (effaced).  You will be tested for Group B streptococcus. This happens between 35 and 37 weeks.  Your health care provider may ask you:  What your birth plan is.  How you are feeling.  If you are feeling the baby move.  If you have had   any abnormal symptoms, such as leaking fluid, bleeding, severe headaches, or abdominal cramping.  If you are using any tobacco products, including cigarettes, chewing tobacco, and electronic cigarettes.  If you have any questions.  Other tests or screenings that may be performed during your third trimester include:  Blood tests that check for low iron levels (anemia).  Fetal testing to check the health, activity level, and growth of the fetus. Testing is done if you have certain medical conditions or if there are problems during the  pregnancy.  Nonstress test (NST). This test checks the health of your baby to make sure there are no signs of problems, such as the baby not getting enough oxygen. During this test, a belt is placed around your belly. The baby is made to move, and its heart rate is monitored during movement.  What is false labor? False labor is a condition in which you feel small, irregular tightenings of the muscles in the womb (contractions) that usually go away with rest, changing position, or drinking water. These are called Braxton Hicks contractions. Contractions may last for hours, days, or even weeks before true labor sets in. If contractions come at regular intervals, become more frequent, increase in intensity, or become painful, you should see your health care provider. What are the signs of labor?  Abdominal cramps.  Regular contractions that start at 10 minutes apart and become stronger and more frequent with time.  Contractions that start on the top of the uterus and spread down to the lower abdomen and back.  Increased pelvic pressure and dull back pain.  A watery or bloody mucus discharge that comes from the vagina.  Leaking of amniotic fluid. This is also known as your "water breaking." It could be a slow trickle or a gush. Let your health care provider know if it has a color or strange odor. If you have any of these signs, call your health care provider right away, even if it is before your due date. Follow these instructions at home: Medicines  Follow your health care provider's instructions regarding medicine use. Specific medicines may be either safe or unsafe to take during pregnancy.  Take a prenatal vitamin that contains at least 600 micrograms (mcg) of folic acid.  If you develop constipation, try taking a stool softener if your health care provider approves. Eating and drinking  Eat a balanced diet that includes fresh fruits and vegetables, whole grains, good sources of protein  such as meat, eggs, or tofu, and low-fat dairy. Your health care provider will help you determine the amount of weight gain that is right for you.  Avoid raw meat and uncooked cheese. These carry germs that can cause birth defects in the baby.  If you have low calcium intake from food, talk to your health care provider about whether you should take a daily calcium supplement.  Eat four or five small meals rather than three large meals a day.  Limit foods that are high in fat and processed sugars, such as fried and sweet foods.  To prevent constipation: ? Drink enough fluid to keep your urine clear or pale yellow. ? Eat foods that are high in fiber, such as fresh fruits and vegetables, whole grains, and beans. Activity  Exercise only as directed by your health care provider. Most women can continue their usual exercise routine during pregnancy. Try to exercise for 30 minutes at least 5 days a week. Stop exercising if you experience uterine contractions.  Avoid heavy   lifting.  Do not exercise in extreme heat or humidity, or at high altitudes.  Wear low-heel, comfortable shoes.  Practice good posture.  You may continue to have sex unless your health care provider tells you otherwise. Relieving pain and discomfort  Take frequent breaks and rest with your legs elevated if you have leg cramps or low back pain.  Take warm sitz baths to soothe any pain or discomfort caused by hemorrhoids. Use hemorrhoid cream if your health care provider approves.  Wear a good support bra to prevent discomfort from breast tenderness.  If you develop varicose veins: ? Wear support pantyhose or compression stockings as told by your healthcare provider. ? Elevate your feet for 15 minutes, 3-4 times a day. Prenatal care  Write down your questions. Take them to your prenatal visits.  Keep all your prenatal visits as told by your health care provider. This is important. Safety  Wear your seat belt at  all times when driving.  Make a list of emergency phone numbers, including numbers for family, friends, the hospital, and police and fire departments. General instructions  Avoid cat litter boxes and soil used by cats. These carry germs that can cause birth defects in the baby. If you have a cat, ask someone to clean the litter box for you.  Do not travel far distances unless it is absolutely necessary and only with the approval of your health care provider.  Do not use hot tubs, steam rooms, or saunas.  Do not drink alcohol.  Do not use any products that contain nicotine or tobacco, such as cigarettes and e-cigarettes. If you need help quitting, ask your health care provider.  Do not use any medicinal herbs or unprescribed drugs. These chemicals affect the formation and growth of the baby.  Do not douche or use tampons or scented sanitary pads.  Do not cross your legs for long periods of time.  To prepare for the arrival of your baby: ? Take prenatal classes to understand, practice, and ask questions about labor and delivery. ? Make a trial run to the hospital. ? Visit the hospital and tour the maternity area. ? Arrange for maternity or paternity leave through employers. ? Arrange for family and friends to take care of pets while you are in the hospital. ? Purchase a rear-facing car seat and make sure you know how to install it in your car. ? Pack your hospital bag. ? Prepare the baby's nursery. Make sure to remove all pillows and stuffed animals from the baby's crib to prevent suffocation.  Visit your dentist if you have not gone during your pregnancy. Use a soft toothbrush to brush your teeth and be gentle when you floss. Contact a health care provider if:  You are unsure if you are in labor or if your water has broken.  You become dizzy.  You have mild pelvic cramps, pelvic pressure, or nagging pain in your abdominal area.  You have lower back pain.  You have persistent  nausea, vomiting, or diarrhea.  You have an unusual or bad smelling vaginal discharge.  You have pain when you urinate. Get help right away if:  Your water breaks before 37 weeks.  You have regular contractions less than 5 minutes apart before 37 weeks.  You have a fever.  You are leaking fluid from your vagina.  You have spotting or bleeding from your vagina.  You have severe abdominal pain or cramping.  You have rapid weight loss or weight gain.    You have shortness of breath with chest pain.  You notice sudden or extreme swelling of your face, hands, ankles, feet, or legs.  Your baby makes fewer than 10 movements in 2 hours.  You have severe headaches that do not go away when you take medicine.  You have vision changes. Summary  The third trimester is from week 28 through week 40, months 7 through 9. The third trimester is a time when the unborn baby (fetus) is growing rapidly.  During the third trimester, your discomfort may increase as you and your baby continue to gain weight. You may have abdominal, leg, and back pain, sleeping problems, and an increased need to urinate.  During the third trimester your breasts will keep growing and they will continue to become tender. A yellow fluid (colostrum) may leak from your breasts. This is the first milk you are producing for your baby.  False labor is a condition in which you feel small, irregular tightenings of the muscles in the womb (contractions) that eventually go away. These are called Braxton Hicks contractions. Contractions may last for hours, days, or even weeks before true labor sets in.  Signs of labor can include: abdominal cramps; regular contractions that start at 10 minutes apart and become stronger and more frequent with time; watery or bloody mucus discharge that comes from the vagina; increased pelvic pressure and dull back pain; and leaking of amniotic fluid. This information is not intended to replace advice  given to you by your health care provider. Make sure you discuss any questions you have with your health care provider. Document Released: 09/27/2001 Document Revised: 03/10/2016 Document Reviewed: 12/04/2012 Elsevier Interactive Patient Education  2017 [employer]  

## 2017-07-28 NOTE — MAU Provider Note (Signed)
History     CSN: [id number]  Arrival date and time: 07/28/17 1053   First Provider Initiated Contact with Patient 07/28/17 1128      Chief Complaint  Patient presents with  . Abdominal Pain    right side  . Epistaxis   Gabrielle Zavala is a 31 y.o. [phone] at [redacted]w[redacted]d who presents today with right sided abdominal pain x 3 days. She denies any contractions, VB or LOF. She reports normal fetal movement. She also had a nosebleed today, and afterward she coughed up "a lot" of blood. Reports it only happened once. Has not had anymore blood in her sputum since.    Abdominal Pain  This is a new problem. Episode onset: 3 days ago.  The onset quality is gradual. The problem occurs intermittently. The problem has been unchanged. The pain is located in the RLQ and RUQ. The pain is at a severity of 5/10. The quality of the pain is sharp. The abdominal pain does not radiate. Pertinent negatives include no dysuria, fever, frequency, nausea or vomiting. Nothing aggravates the pain. The pain is relieved by nothing. She has tried nothing for the symptoms.  Epistaxis   The bleeding has been from both nares. This is a new problem. The current episode started yesterday. The problem has been resolved.   Past Medical History:  Diagnosis Date  . Anxiety   . 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 Pre-eclampsia     Past Surgical History:  Procedure Laterality Date  . COLPOSCOPY  05/2011   lgsil  . WISDOM TOOTH EXTRACTION      Family History  Problem Relation Age of Onset  . Liver disease Mother   . Breast cancer Mother 84  . Diabetes Maternal Uncle     Social History  Substance Use Topics  . Smoking status: Never Smoker  . Smokeless tobacco: Never Used  . Alcohol use 0.0 oz/week     Comment: 3-4 times a week    Allergies: No Known  Allergies  Prescriptions Prior to Admission  Medication Sig Dispense Refill Last Dose  . butalbital-acetaminophen-caffeine (FIORICET, ESGIC) 50-325-40 MG tablet Take 1 tablet by mouth 2 (two) times daily as needed for headache.   03/20/2016 at Unknown time  . ibuprofen (ADVIL,MOTRIN) 600 MG tablet Take 1 tablet (600 mg total) by mouth every 6 (six) hours as needed. 90 tablet 0   . NIFEdipine (PROCARDIA-XL/ADALAT CC) 30 MG 24 hr tablet Take 1 tablet (30 mg total) by mouth daily. 30 tablet 1   . oxyCODONE-acetaminophen (PERCOCET/ROXICET) 5-325 MG tablet Take 1 tablet by mouth every 4 (four) hours as needed (pain scale 4-7). 30 tablet 0   . Prenatal Vit-Fe Fumarate-FA (PRENATAL MULTIVITAMIN) TABS tablet Take 1 tablet by mouth daily at 12 noon.    03/21/2016 at Unknown time    Review of Systems  Constitutional: Negative for chills and fever.  HENT: Positive for nosebleeds.   Gastrointestinal: Positive for abdominal pain. Negative for nausea and vomiting.  Genitourinary: Negative for dysuria, frequency, urgency, vaginal bleeding and vaginal discharge.   Physical Exam   unknown if currently breastfeeding.  Physical Exam  Nursing note and vitals reviewed. Constitutional: She is oriented to person, place, and time. She appears well-developed and well-nourished. No distress.  HENT:  Head: Normocephalic.  Cardiovascular: Normal rate.   Respiratory: Effort normal.  GI: Soft.  There is no tenderness. There is no rebound.  Neurological: She is alert and oriented to person, place, and time.  Skin: Skin is warm and dry.  Psychiatric: She has a normal mood and affect.  FHT: 135, moderate with 15x15 accels, no decels  Toco: no UCs    Results for orders placed or performed during the hospital encounter of 07/28/17 (from the past 24 hour(s))  Urinalysis, Routine w reflex microscopic     Status: Abnormal   Collection Time: 07/28/17 10:58 AM  Result Value Ref Range   Color, Urine COLORLESS (A) YELLOW    APPearance CLEAR CLEAR   Specific Gravity, Urine 1.002 (L) 1.005 - 1.030   pH 6.0 5.0 - 8.0   Glucose, UA NEGATIVE NEGATIVE mg/dL   Hgb urine dipstick NEGATIVE NEGATIVE   Bilirubin Urine NEGATIVE NEGATIVE   Ketones, ur NEGATIVE NEGATIVE mg/dL   Protein, ur NEGATIVE NEGATIVE mg/dL   Nitrite NEGATIVE NEGATIVE   Leukocytes, UA NEGATIVE NEGATIVE  CBC     Status: Abnormal   Collection Time: 07/28/17 11:46 AM  Result Value Ref Range   WBC 9.7 4.0 - 10.5 K/uL   RBC 4.02 3.87 - 5.11 MIL/uL   Hemoglobin 11.2 (L) 12.0 - 15.0 g/dL   HCT 16.1 (L) 09.6 - 04.5 %   MCV 87.1 78.0 - 100.0 fL   MCH 27.9 26.0 - 34.0 pg   MCHC 32.0 30.0 - 36.0 g/dL   RDW 40.9 81.1 - 91.4 %   Platelets 217 150 - 400 K/uL  Comprehensive metabolic panel     Status: Abnormal   Collection Time: 07/28/17 11:46 AM  Result Value Ref Range   Sodium 137 135 - 145 mmol/L   Potassium 4.1 3.5 - 5.1 mmol/L   Chloride 106 101 - 111 mmol/L   CO2 25 22 - 32 mmol/L   Glucose, Bld 89 65 - 99 mg/dL   BUN 8 6 - 20 mg/dL   Creatinine, Ser 7.82 0.44 - 1.00 mg/dL   Calcium 9.3 8.9 - 95.6 mg/dL   Total Protein 6.7 6.5 - 8.1 g/dL   Albumin 2.9 (L) 3.5 - 5.0 g/dL   AST 14 (L) 15 - 41 U/L   ALT 15 14 - 54 U/L   Alkaline Phosphatase 68 38 - 126 U/L   Total Bilirubin 0.3 0.3 - 1.2 mg/dL   GFR calc non Af Amer >60 >60 mL/min   GFR calc Af Amer >60 >60 mL/min   Anion gap 6 5 - 15  Amylase     Status: None   Collection Time: 07/28/17 11:46 AM  Result Value Ref Range   Amylase 56 28 - 100 U/L  Lipase, blood     Status: None   Collection Time: 07/28/17 11:46 AM  Result Value Ref Range   Lipase 20 11 - 51 U/L    MAU Course  Procedures  MDM DW Dr. Claiborne Billings, ok for DC home if labs are normal   Assessment and Plan   1. Abdominal pain in pregnancy, second trimester   2. Epistaxis    DC home Comfort measures reviewed  3rd Trimester precautions  PTL precautions  Fetal kick counts RX: none  Return to MAU as needed FU with  OB as planned  Follow-up Information    Philip Aspen, DO Follow up.   Specialty:  Obstetrics and Gynecology Contact information: [address] [place] [address] [phone]            [name] 07/28/2017, 11:29 AM

## 2017-07-28 NOTE — MAU Note (Signed)
Right side pain off and on since Wednesday, had nose bleed this morning and coughed up blood.

## 2017-09-20 ENCOUNTER — Encounter (HOSPITAL_COMMUNITY): Payer: Self-pay | Admitting: *Deleted

## 2017-09-20 ENCOUNTER — Inpatient Hospital Stay (HOSPITAL_COMMUNITY)
Admission: AD | Admit: 2017-09-20 | Discharge: 2017-09-20 | Disposition: A | Discharge status: Home / Self Care | Payer: 59 | Source: Ambulatory Visit | Attending: Obstetrics and Gynecology | Admitting: Obstetrics and Gynecology

## 2017-09-20 DIAGNOSIS — R519 Headache, unspecified: Secondary | ICD-10-CM

## 2017-09-20 DIAGNOSIS — Z3689 Encounter for other specified antenatal screening: Secondary | ICD-10-CM

## 2017-09-20 DIAGNOSIS — O368130 Decreased fetal movements, third trimester, not applicable or unspecified: Secondary | ICD-10-CM | POA: Diagnosis not present

## 2017-09-20 DIAGNOSIS — R51 Headache: Secondary | ICD-10-CM | POA: Insufficient documentation

## 2017-09-20 DIAGNOSIS — Z3A34 34 weeks gestation of pregnancy: Secondary | ICD-10-CM

## 2017-09-20 DIAGNOSIS — O26893 Other specified pregnancy related conditions, third trimester: Secondary | ICD-10-CM | POA: Insufficient documentation

## 2017-09-20 LAB — URINALYSIS, ROUTINE W REFLEX MICROSCOPIC
Bilirubin Urine: NEGATIVE
Glucose, UA: NEGATIVE mg/dL
Hgb urine dipstick: NEGATIVE
Ketones, ur: NEGATIVE mg/dL
LEUKOCYTES UA: NEGATIVE
Nitrite: NEGATIVE
Protein, ur: NEGATIVE mg/dL
Specific Gravity, Urine: 1.002 — ABNORMAL LOW (ref 1.005–1.030)
pH: 7 (ref 5.0–8.0)

## 2017-09-20 MED ORDER — PANTOPRAZOLE SODIUM 40 MG IV SOLR
40.0000 mg | Freq: Once | INTRAVENOUS | Status: DC
Start: 2017-09-20 — End: 2017-09-20

## 2017-09-20 MED ORDER — ACETAMINOPHEN 500 MG PO TABS
1000.0000 mg | ORAL_TABLET | Freq: Once | ORAL | Status: AC
  Administered 2017-09-20: 1000 mg via ORAL
  Filled 2017-09-20: qty 2

## 2017-09-20 NOTE — MAU Provider Note (Signed)
History     CSN: [id number]  Arrival date and time: 09/20/17 1006   First Provider Initiated Contact with Patient 09/20/17 1048      Chief Complaint  Patient presents with  . Decreased Fetal Movement  . Headache   HPI Gabrielle Zavala is a 31 y.o. [id number] at 6161w4d who presents with headache & decreased fetal movement. History of preeclampsia in previous pregnancies.  Reports DFM since yesterday. States no fetal movement last night & has felt 4 movements since waking up this morning. Denies abdominal pain, recent abdominal trauma, vaginal bleeding, or LOF.  History of headache. Reports frontal headache that she rates 7/10. Has not treated symptoms. Endorses photophobia & nausea. Denies visual disturbance or epigastric pain.   OB History    Gravida Para Term Preterm AB Living   3 2 0 2   2   SAB TAB Ectopic Multiple Live Births   0     0 2      Obstetric Comments   IOL for G1 & G2 d/t preeclampsia      Past Medical History:  Diagnosis Date  . Anxiety   . 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 Pre-eclampsia     Past Surgical History:  Procedure Laterality Date  . COLPOSCOPY  05/2011   lgsil  . WISDOM TOOTH EXTRACTION      Family History  Problem Relation Age of Onset  . Liver disease Mother   . Breast cancer Mother [phone]  . Diabetes Maternal Uncle     Social History   Tobacco Use  . Smoking status: Never Smoker  . Smokeless tobacco: Never Used  Substance Use Topics  . Alcohol use: Yes    Alcohol/week: 0.0 oz    Comment: 3-4 times a week  . Drug use: No    Comment: past use    Allergies: No Known Allergies  Medications Prior to Admission  Medication Sig Dispense Refill Last Dose  . acetaminophen (TYLENOL) 325 MG tablet Take 650 mg by mouth every 6 (six) hours as needed for moderate pain or headache.   09/19/2017 at  Unknown time  . Prenatal Vit-Fe Fumarate-FA (PRENATAL MULTIVITAMIN) TABS tablet Take 1 tablet by mouth daily at 12 noon.    09/20/2017 at Unknown time  . NIFEdipine (PROCARDIA-XL/ADALAT CC) 30 MG 24 hr tablet Take 1 tablet (30 mg total) by mouth daily. (Patient not taking: Reported on 09/20/2017) 30 tablet 1 Completed Course at Unknown time  . oxyCODONE-acetaminophen (PERCOCET/ROXICET) 5-325 MG tablet Take 1 tablet by mouth every 4 (four) hours as needed (pain scale 4-7). (Patient not taking: Reported on 09/20/2017) 30 tablet 0 Completed Course at Unknown time    Review of Systems  Constitutional: Negative.   Eyes: Positive for photophobia. Negative for visual disturbance.  Gastrointestinal: Negative.   Genitourinary: Negative.   Neurological: Positive for headaches.   Physical Exam   Blood pressure 125/78, pulse 79, temperature 98.7 F (37.1 C), temperature source Oral, resp. rate 16, height 5\' 6"  (1.676 m), weight 204 lb (92.5 kg), SpO2 99 %, unknown if currently breastfeeding.  Physical Exam  Nursing note and vitals reviewed. Constitutional: She is oriented to person, place, and time. She appears well-developed and well-nourished. No distress.  HENT:  Head: Normocephalic and atraumatic.  Eyes: Conjunctivae are normal. Right eye exhibits no discharge. Left eye exhibits no discharge. No  scleral icterus.  Neck: Normal range of motion.  Cardiovascular: Normal rate, regular rhythm and normal heart sounds.  No murmur heard. Respiratory: Effort normal and breath sounds normal. No respiratory distress. She has no wheezes.  GI: Soft. There is no tenderness.  Neurological: She is alert and oriented to person, place, and time. She has normal reflexes.  No clonus  Skin: Skin is warm and dry. She is not diaphoretic.  Psychiatric: She has a normal mood and affect. Her behavior is normal. Judgment and thought content normal.    MAU Course  Procedures Results for orders placed or performed  during the hospital encounter of 09/20/17 (from the past 24 hour(s))  Urinalysis, Routine w reflex microscopic     Status: Abnormal   Collection Time: 09/20/17 10:22 AM  Result Value Ref Range   Color, Urine COLORLESS (A) YELLOW   APPearance CLEAR CLEAR   Specific Gravity, Urine 1.002 (L) 1.005 - 1.030   pH 7.0 5.0 - 8.0   Glucose, UA NEGATIVE NEGATIVE mg/dL   Hgb urine dipstick NEGATIVE NEGATIVE   Bilirubin Urine NEGATIVE NEGATIVE   Ketones, ur NEGATIVE NEGATIVE mg/dL   Protein, ur NEGATIVE NEGATIVE mg/dL   Nitrite NEGATIVE NEGATIVE   Leukocytes, UA NEGATIVE NEGATIVE    MDM NST:  Baseline: 145 bpm, Variability: Good {> 6 bpm), Accelerations: Reactive and Decelerations: Absent Reports fetal movement increased since being on the monitor Tylenol 1 gm PO --- pt reports improvement in symptoms , 7>3 Normotensive C/w Dr. Claiborne Billingsallahan. Ok to discharge home.  Assessment and Plan  A:  1. Decreased fetal movements in third trimester, single or unspecified fetus   2. Headache in pregnancy, antepartum, third trimester   3. NST (non-stress test) reactive    P: Discharge home Keep f/u with OB Discussed reasons to return to MAU  Judeth HornErin Aeon Kessner 09/20/2017, 12:15 PM

## 2017-09-20 NOTE — MAU Note (Signed)
Pt reports decreased fetal movement since yesterday, felt some movement this am. Also reports a headache this am, states she has one every day but this one is different.

## 2017-09-20 NOTE — Discharge Instructions (Signed)
General Headache Without Cause A headache is pain or discomfort felt around the head or neck area. The specific cause of a headache may not be found. There are many causes and types of headaches. A few common ones are:  Tension headaches.  Migraine headaches.  Cluster headaches.  Chronic daily headaches.  Follow these instructions at home: Watch your condition for any changes. Take these steps to help with your condition: Managing pain  Take over-the-counter and prescription medicines only as told by your health care provider.  Lie down in a dark, quiet room when you have a headache.  If directed, apply ice to the head and neck area: ? Put ice in a plastic bag. ? Place a towel between your skin and the bag. ? Leave the ice on for 20 minutes, 2-3 times per day.  Use a heating pad or hot shower to apply heat to the head and neck area as told by your health care provider.  Keep lights dim if bright lights bother you or make your headaches worse. Eating and drinking  Eat meals on a regular schedule.  Limit alcohol use.  Decrease the amount of caffeine you drink, or stop drinking caffeine. General instructions  Keep all follow-up visits as told by your health care provider. This is important.  Keep a headache journal to help find out what may trigger your headaches. For example, write down: ? What you eat and drink. ? How much sleep you get. ? Any change to your diet or medicines.  Try massage or other relaxation techniques.  Limit stress.  Sit up straight, and do not tense your muscles.  Do not use tobacco products, including cigarettes, chewing tobacco, or e-cigarettes. If you need help quitting, ask your health care provider.  Exercise regularly as told by your health care provider.  Sleep on a regular schedule. Get 7-9 hours of sleep, or the amount recommended by your health care provider. Contact a health care provider if:  Your symptoms are not helped by  medicine.  You have a headache that is different from the usual headache.  You have nausea or you vomit.  You have a fever. Get help right away if:  Your headache becomes severe.  You have repeated vomiting.  You have a stiff neck.  You have a loss of vision.  You have problems with speech.  You have pain in the eye or ear.  You have muscular weakness or loss of muscle control.  You lose your balance or have trouble walking.  You feel faint or pass out.  You have confusion. This information is not intended to replace advice given to you by your health care provider. Make sure you discuss any questions you have with your health care provider. Document Released: 10/03/2005 Document Revised: 03/10/2016 Document Reviewed: 01/26/2015 Elsevier Interactive Patient Education  2017 Else[employer] Fetal Movement Counts Patient Name: ________________________________________________ Patient Due Date: ____________________ What is a fetal movement count? A fetal movement count is the number of times that you feel your baby move during a certain amount of time. This may also be called a fetal kick count. A fetal movement count is recommended for every pregnant woman. You may be asked to start counting fetal movements as early as week 28 of your pregnancy. Pay attention to when your baby is most active. You may notice your baby's sleep and wake cycles. You may also notice things that make your baby move more. You should do a fetal movement count:  When your baby is normally most active.  At the same time each day.  A good time to count movements is while you are resting, after having something to eat and drink. How do I count fetal movements? 1. Find a quiet, comfortable area. Sit, or lie down on your side. 2. Write down the date, the start time and stop time, and the number of movements that you felt between those two times. Take this information with you to your health care  visits. 3. For 2 hours, count kicks, flutters, swishes, rolls, and jabs. You should feel at least 10 movements during 2 hours. 4. You may stop counting after you have felt 10 movements. 5. If you do not feel 10 movements in 2 hours, have something to eat and drink. Then, keep resting and counting for 1 hour. If you feel at least 4 movements during that hour, you may stop counting. Contact a health care provider if:  You feel fewer than 4 movements in 2 hours.  Your baby is not moving like he or she usually does. Date: ____________ Start time: ____________ Stop time: ____________ Movements: ____________ Date: ____________ Start time: ____________ Stop time: ____________ Movements: ____________ Date: ____________ Start time: ____________ Stop time: ____________ Movements: ____________ Date: ____________ Start time: ____________ Stop time: ____________ Movements: ____________ Date: ____________ Start time: ____________ Stop time: ____________ Movements: ____________ Date: ____________ Start time: ____________ Stop time: ____________ Movements: ____________ Date: ____________ Start time: ____________ Stop time: ____________ Movements: ____________ Date: ____________ Start time: ____________ Stop time: ____________ Movements: ____________ Date: ____________ Start time: ____________ Stop time: ____________ Movements: ____________ This information is not intended to replace advice given to you by your health care provider. Make sure you discuss any questions you have with your health care provider. Document Released: 11/02/2006 Document Revised: 06/01/2016 Document Reviewed: 11/12/2015 Elsevier Interactive Patient Education  [employer]2018 Else[employer] Inc[employer]

## 2017-09-27 LAB — OB RESULTS CONSOLE GBS: STREP GROUP B AG: NEGATIVE

## 2017-10-01 ENCOUNTER — Inpatient Hospital Stay (HOSPITAL_COMMUNITY)
Admission: AD | Admit: 2017-10-01 | Discharge: 2017-10-01 | Disposition: A | Discharge status: Home / Self Care | Payer: 59 | Source: Ambulatory Visit | Attending: Obstetrics | Admitting: Obstetrics

## 2017-10-01 ENCOUNTER — Encounter (HOSPITAL_COMMUNITY): Payer: Self-pay

## 2017-10-01 DIAGNOSIS — R030 Elevated blood-pressure reading, without diagnosis of hypertension: Secondary | ICD-10-CM | POA: Diagnosis not present

## 2017-10-01 DIAGNOSIS — O26893 Other specified pregnancy related conditions, third trimester: Secondary | ICD-10-CM | POA: Diagnosis not present

## 2017-10-01 DIAGNOSIS — R51 Headache: Secondary | ICD-10-CM | POA: Diagnosis not present

## 2017-10-01 DIAGNOSIS — Z3A36 36 weeks gestation of pregnancy: Secondary | ICD-10-CM | POA: Insufficient documentation

## 2017-10-01 LAB — COMPREHENSIVE METABOLIC PANEL
ALBUMIN: 2.8 g/dL — AB (ref 3.5–5.0)
ALK PHOS: 120 U/L (ref 38–126)
ALT: 12 U/L — AB (ref 14–54)
AST: 16 U/L (ref 15–41)
Anion gap: 10 (ref 5–15)
BUN: 14 mg/dL (ref 6–20)
CALCIUM: 8.5 mg/dL — AB (ref 8.9–10.3)
CO2: 19 mmol/L — ABNORMAL LOW (ref 22–32)
CREATININE: 0.57 mg/dL (ref 0.44–1.00)
Chloride: 105 mmol/L (ref 101–111)
GFR calc Af Amer: >60 mL/min (ref >60)
GFR calc non Af Amer: >60 mL/min (ref >60)
GLUCOSE: 93 mg/dL (ref 65–99)
Potassium: 3.9 mmol/L (ref 3.5–5.1)
Sodium: 134 mmol/L — ABNORMAL LOW (ref 135–145)
Total Bilirubin: 0.4 mg/dL (ref 0.3–1.2)
Total Protein: 6.9 g/dL (ref 6.5–8.1)

## 2017-10-01 LAB — URINALYSIS, ROUTINE W REFLEX MICROSCOPIC
Bilirubin Urine: NEGATIVE
Glucose, UA: NEGATIVE mg/dL
Hgb urine dipstick: NEGATIVE
KETONES UR: NEGATIVE mg/dL
LEUKOCYTES UA: NEGATIVE
NITRITE: NEGATIVE
PROTEIN: NEGATIVE mg/dL
Specific Gravity, Urine: 1.004 — ABNORMAL LOW (ref 1.005–1.030)
pH: 6 (ref 5.0–8.0)

## 2017-10-01 LAB — CBC
HEMATOCRIT: 37.9 % (ref 36.0–46.0)
HEMOGLOBIN: 12.1 g/dL (ref 12.0–15.0)
MCH: 26.8 pg (ref 26.0–34.0)
MCHC: 31.9 g/dL (ref 30.0–36.0)
MCV: 83.8 fL (ref 78.0–100.0)
Platelets: 204 10*3/uL (ref 150–400)
RBC: 4.52 MIL/uL (ref 3.87–5.11)
RDW: 14.5 % (ref 11.5–15.5)
WBC: 11.2 10*3/uL — ABNORMAL HIGH (ref 4.0–10.5)

## 2017-10-01 LAB — PROTEIN / CREATININE RATIO, URINE
Creatinine, Urine: 29 mg/dL
Total Protein, Urine: <6 mg/dL

## 2017-10-01 MED ORDER — ACETAMINOPHEN 325 MG PO TABS
650.0000 mg | ORAL_TABLET | Freq: Once | ORAL | Status: AC
  Administered 2017-10-01: 650 mg via ORAL
  Filled 2017-10-01: qty 2

## 2017-10-01 NOTE — MAU Provider Note (Signed)
Chief Complaint:  Hypertension and Headache   First Provider Initiated Contact with Patient 10/01/17 1832     HPI  HPI: Gabrielle Zavala is a 31 y.o. [id number] at 1w1dwho presents to maternity admissions reporting headache & hypertension. Reports headache since this afternoon. Describes as frontal headache that she rates 5/10. Has not treated pain. Nothing makes better or worse. Took BP at home & it was 140s/100s. Denies visual disturbance or epigastric pain. Positive fetal movement. Hx of preeclampsia with both previous pregnancies.    Past Medical History: Past Medical History:  Diagnosis Date  . Anxiety   . 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 Pre-eclampsia     Past obstetric history: OB History  Gravida Para Term Preterm AB Living  3 2 0 2   2  SAB TAB Ectopic Multiple Live Births  0     0 2    # Outcome Date GA Lbr Len/2nd Weight Sex Delivery Anes PTL Lv  3 Current           2 Preterm 03/22/16 [redacted]w[redacted]d / 00:21 6 lb 7.5 oz (2.935 kg) F Vag-Spont EPI  LIV     Birth Comments: none  1 Preterm 08/14/06 [redacted]w[redacted]d   M    LIV    Obstetric Comments  IOL for G1 & G2 d/t preeclampsia    Past Surgical History: Past Surgical History:  Procedure Laterality Date  . COLPOSCOPY  05/2011   lgsil  . WISDOM TOOTH EXTRACTION      Family History: Family History  Problem Relation Age of Onset  . Liver disease Mother   . Breast cancer Mother 80  . Diabetes Maternal Uncle     Social History: Social History   Tobacco Use  . Smoking status: Never Smoker  . Smokeless tobacco: Never Used  Substance Use Topics  . Alcohol use: Yes    Alcohol/week: 0.0 oz    Comment: 3-4 times a week  . Drug use: No    Comment: past use    Allergies: No Known Allergies  Meds:  No medications prior to admission.    I have reviewed patient's Past Medical Hx,  Surgical Hx, Family Hx, Social Hx, medications and allergies.   ROS:  Review of Systems  Constitutional: Negative.   Eyes: Negative for visual disturbance.  Gastrointestinal: Negative.   Genitourinary: Negative.   Neurological: Positive for headaches.   Other systems negative  Physical Exam   Patient Vitals for the past 24 hrs:  BP Temp Temp src Pulse Resp SpO2 Height Weight  10/01/17 2001 120/79 - - 79 - - - -  10/01/17 1951 124/81 - - 77 - - - -  10/01/17 1916 120/84 - - 81 - - - -  10/01/17 1901 125/84 - - 74 - - - -  10/01/17 1846 129/86 - - 87 - - - -  10/01/17 1830 (!) 130/92 - - 85 - - - -  10/01/17 1827 - - - - - 94 % - -  10/01/17 1820 - - - - - 94 % - -  10/01/17 1816 (!) 129/94 - - 92 - - - -  10/01/17 1815 - - - - - 94 % - -  10/01/17 1811 (!) 136/95 - - 84 - - - -  10/01/17 1755 (!) 140/95 98.6 F (37 C) Oral 86 15 99 %  5\' 6"  (1.676 m) 206 lb (93.4 kg)   Constitutional: Well-developed, well-nourished female in no acute distress.  Cardiovascular: normal rate and rhythm Respiratory: normal effort, clear to auscultation bilaterally GI: Abd soft, non-tender, gravid appropriate for gestational age.   No rebound or guarding. MS: Extremities nontender, no edema, normal ROM Neurologic: Alert and oriented x 4. Bilateral patellar DTR 2+. No clonus.   FHT:  Baseline 135 , moderate variability, accelerations present, no decelerations Contractions:  Rare   Labs: Results for orders placed or performed during the hospital encounter of 10/01/17 (from the past 24 hour(s))  Urinalysis, Routine w reflex microscopic     Status: Abnormal   Collection Time: 10/01/17  5:55 PM  Result Value Ref Range   Color, Urine STRAW (A) YELLOW   APPearance CLEAR CLEAR   Specific Gravity, Urine 1.004 (L) 1.005 - 1.030   pH 6.0 5.0 - 8.0   Glucose, UA NEGATIVE NEGATIVE mg/dL   Hgb urine dipstick NEGATIVE NEGATIVE   Bilirubin Urine NEGATIVE NEGATIVE   Ketones, ur NEGATIVE NEGATIVE mg/dL    Protein, ur NEGATIVE NEGATIVE mg/dL   Nitrite NEGATIVE NEGATIVE   Leukocytes, UA NEGATIVE NEGATIVE  Protein / creatinine ratio, urine     Status: None   Collection Time: 10/01/17  5:55 PM  Result Value Ref Range   Creatinine, Urine 29.00 mg/dL   Total Protein, Urine <6 mg/dL   Protein Creatinine Ratio        0.00 - 0.15 mg/mg[Cre]  CBC     Status: Abnormal   Collection Time: 10/01/17  6:08 PM  Result Value Ref Range   WBC 11.2 (H) 4.0 - 10.5 K/uL   RBC 4.52 3.87 - 5.11 MIL/uL   Hemoglobin 12.1 12.0 - 15.0 g/dL   HCT 16.137.9 09.636.0 - 04.546.0 %   MCV 83.8 78.0 - 100.0 fL   MCH 26.8 26.0 - 34.0 pg   MCHC 31.9 30.0 - 36.0 g/dL   RDW 40.914.5 81.111.5 - 91.415.5 %   Platelets 204 150 - 400 K/uL  Comprehensive metabolic panel     Status: Abnormal   Collection Time: 10/01/17  6:08 PM  Result Value Ref Range   Sodium 134 (L) 135 - 145 mmol/L   Potassium 3.9 3.5 - 5.1 mmol/L   Chloride 105 101 - 111 mmol/L   CO2 19 (L) 22 - 32 mmol/L   Glucose, Bld 93 65 - 99 mg/dL   BUN 14 6 - 20 mg/dL   Creatinine, Ser 7.820.57 0.44 - 1.00 mg/dL   Calcium 8.5 (L) 8.9 - 10.3 mg/dL   Total Protein 6.9 6.5 - 8.1 g/dL   Albumin 2.8 (L) 3.5 - 5.0 g/dL   AST 16 15 - 41 U/L   ALT 12 (L) 14 - 54 U/L   Alkaline Phosphatase 120 38 - 126 U/L   Total Bilirubin 0.4 0.3 - 1.2 mg/dL   GFR calc non Af Amer >60 >60 mL/min   GFR calc Af Amer >60 >60 mL/min   Anion gap 10 5 - 15      Imaging:  No results found.  MAU Course/MDM: Reactive NST  Elevated BPs -- none severe range; last several BPs normal CBC, CMP, urine PCR Tylenol for headache -- reports improvement C/w Dr. Chestine Sporelark. Discussed presentation & results. Ok to discharge home. Pt to f/u for BP check on Tuesday    Assessment: 1. Elevated BP without diagnosis of hypertension   2. Headache in pregnancy, antepartum, third trimester   3. [redacted]  weeks gestation of pregnancy     Plan: Discharge home Preeclampsia precautions discussed Follow up in Office for prenatal visits  and recheck  Follow-up Information    [facility] Schedule an appointment as soon as possible for a visit on 10/03/2017.   Why:  for blood pressure check in the office Contact information: [address] [place] [address] [phone]           Pt stable at time of discharge.  Judeth HornErin Anny Sayler, FNP 10/01/2017 9:18 PM

## 2017-10-01 NOTE — Discharge Instructions (Signed)
Hypertension During Pregnancy °Hypertension, commonly called high blood pressure, is when the force of blood pumping through your arteries is too strong. Arteries are blood vessels that carry blood from the heart throughout the body. Hypertension during pregnancy can cause problems for you and your baby. Your baby may be born early (prematurely) or may not weigh as much as he or she should at birth. Very bad cases of hypertension during pregnancy can be life-threatening. °Different types of hypertension can occur during pregnancy. These include: °· Chronic hypertension. This happens when: °? You have hypertension before pregnancy and it continues during pregnancy. °? You develop hypertension before you are [redacted] weeks pregnant, and it continues during pregnancy. °· Gestational hypertension. This is hypertension that develops after the 20th week of pregnancy. °· Preeclampsia, also called toxemia of pregnancy. This is a very serious type of hypertension that develops only during pregnancy. It affects the whole body, and it can be very dangerous for you and your baby. ° °Gestational hypertension and preeclampsia usually go away within 6 weeks after your baby is born. Women who have hypertension during pregnancy have a greater chance of developing hypertension later in life or during future pregnancies. °What are the causes? °The exact cause of hypertension is not known. °What increases the risk? °There are certain factors that make it more likely for you to develop hypertension during pregnancy. These include: °· Having hypertension during a previous pregnancy or prior to pregnancy. °· Being overweight. °· Being older than age 40. °· Being pregnant for the first time or being pregnant with more than one baby. °· Becoming pregnant using fertilization methods such as IVF (in vitro fertilization). °· Having diabetes, kidney problems, or systemic lupus erythematosus. °· Having a family history of hypertension. ° °What are the  signs or symptoms? °Chronic hypertension and gestational hypertension rarely cause symptoms. Preeclampsia causes symptoms, which may include: °· Increased protein in your urine. Your health care provider will check for this at every visit before you give birth (prenatal visit). °· Severe headaches. °· Sudden weight gain. °· Swelling of the hands, face, legs, and feet. °· Nausea and vomiting. °· Vision problems, such as blurred or double vision. °· Numbness in the face, arms, legs, and feet. °· Dizziness. °· Slurred speech. °· Sensitivity to bright lights. °· Abdominal pain. °· Convulsions. ° °How is this diagnosed? °You may be diagnosed with hypertension during a routine prenatal exam. At each prenatal visit, you may: °· Have a urine test to check for high amounts of protein in your urine. °· Have your blood pressure checked. A blood pressure reading is recorded as two numbers, such as "120 over 80" (or 120/80). The first ("top") number is called the systolic pressure. It is a measure of the pressure in your arteries when your heart beats. The second ("bottom") number is called the diastolic pressure. It is a measure of the pressure in your arteries as your heart relaxes between beats. Blood pressure is measured in a unit called mm Hg. A normal blood pressure reading is: °? Systolic: below 120. °? Diastolic: below 80. ° °The type of hypertension that you are diagnosed with depends on your test results and when your symptoms developed. °· Chronic hypertension is usually diagnosed before 20 weeks of pregnancy. °· Gestational hypertension is usually diagnosed after 20 weeks of pregnancy. °· Hypertension with high amounts of protein in the urine is diagnosed as preeclampsia. °· Blood pressure measurements that stay above 160 systolic, or above 110 diastolic, are   signs of severe preeclampsia. ° °How is this treated? °Treatment for hypertension during pregnancy varies depending on the type of hypertension you have and how  serious it is. °· If you take medicines called ACE inhibitors to treat chronic hypertension, you may need to switch medicines. ACE inhibitors should not be taken during pregnancy. °· If you have gestational hypertension, you may need to take blood pressure medicine. °· If you are at risk for preeclampsia, your health care provider may recommend that you take a low-dose aspirin every day to prevent high blood pressure during your pregnancy. °· If you have severe preeclampsia, you may need to be hospitalized so you and your baby can be monitored closely. You may also need to take medicine (magnesium sulfate) to prevent seizures and to lower blood pressure. This medicine may be given as an injection or through an IV tube. °· In some cases, if your condition gets worse, you may need to deliver your baby early. ° °Follow these instructions at home: °Eating and drinking °· Drink enough fluid to keep your urine clear or pale yellow. °· Eat a healthy diet that is low in salt (sodium). Do not add salt to your food. Check food labels to see how much sodium a food or beverage contains. °Lifestyle °· Do not use any products that contain nicotine or tobacco, such as cigarettes and e-cigarettes. If you need help quitting, ask your health care provider. °· Do not use alcohol. °· Avoid caffeine. °· Avoid stress as much as possible. Rest and get plenty of sleep. °General instructions °· Take over-the-counter and prescription medicines only as told by your health care provider. °· While lying down, lie on your left side. This keeps pressure off your baby. °· While sitting or lying down, raise (elevate) your feet. Try putting some pillows under your lower legs. °· Exercise regularly. Ask your health care provider what kinds of exercise are best for you. °· Keep all prenatal and follow-up visits as told by your health care provider. This is important. °Contact a health care provider if: °· You have symptoms that your health care  provider told you may require more treatment or monitoring, such as: °? Fever. °? Vomiting. °? Headache. °Get help right away if: °· You have severe abdominal pain or vomiting that does not get better with treatment. °· You suddenly develop swelling in your hands, ankles, or face. °· You gain 4 lbs (1.8 kg) or more in 1 week. °· You develop vaginal bleeding, or you have blood in your urine. °· You do not feel your baby moving as much as usual. °· You have blurred or double vision. °· You have muscle twitching or sudden tightening (spasms). °· You have shortness of breath. °· Your lips or fingernails turn blue. °This information is not intended to replace advice given to you by your health care provider. Make sure you discuss any questions you have with your health care provider. °Document Released: 06/21/2011 Document Revised: 04/22/2016 Document Reviewed: 03/18/2016 °Elsevier Interactive Patient Education © 2018 [employer] Inc[employer] ° °

## 2017-10-01 NOTE — MAU Note (Signed)
Pt reports b/p 138/100 today at home, decreased movement today.

## 2017-10-01 NOTE — Progress Notes (Addendum)
G3P2 @ 36.[redacted] wksga. Presents to triage for ha that started 1630. Did NOT take anytning for it. Pt states has chronic migraines and had been taking fiorcet but has not been on anything past month.   Home BP 138/100. See flow sheet for triage BP and serial BP started in room   Denies LOF or bleeding. States decreased FM. FHR reassuring with numerous Accels. See flow OB trace.   Lab at bs.   1923: Up to bathroom  2008: d/c orders received.   D/c instructions given with pt understanding. Pt left unit via ambulatory with SO

## 2017-10-04 ENCOUNTER — Other Ambulatory Visit: Payer: Self-pay | Admitting: Obstetrics and Gynecology

## 2017-10-06 ENCOUNTER — Encounter (HOSPITAL_COMMUNITY): Payer: Self-pay | Admitting: *Deleted

## 2017-10-06 ENCOUNTER — Telehealth (HOSPITAL_COMMUNITY): Payer: Self-pay | Admitting: *Deleted

## 2017-10-06 NOTE — Telephone Encounter (Signed)
Preadmission screen  

## 2017-10-07 ENCOUNTER — Inpatient Hospital Stay (HOSPITAL_COMMUNITY): Payer: 59

## 2017-10-07 ENCOUNTER — Encounter (HOSPITAL_COMMUNITY): Payer: Self-pay

## 2017-10-07 ENCOUNTER — Other Ambulatory Visit: Payer: Self-pay

## 2017-10-07 ENCOUNTER — Inpatient Hospital Stay (HOSPITAL_COMMUNITY)
Admission: RE | Admit: 2017-10-07 | Discharge: 2017-10-08 | DRG: 807 | Disposition: A | Discharge status: Home / Self Care | Payer: 59 | Source: Ambulatory Visit | Attending: Obstetrics and Gynecology | Admitting: Obstetrics and Gynecology

## 2017-10-07 ENCOUNTER — Inpatient Hospital Stay (HOSPITAL_COMMUNITY): Payer: 59 | Admitting: Anesthesiology

## 2017-10-07 DIAGNOSIS — Z3A37 37 weeks gestation of pregnancy: Secondary | ICD-10-CM

## 2017-10-07 DIAGNOSIS — Z3689 Encounter for other specified antenatal screening: Secondary | ICD-10-CM

## 2017-10-07 DIAGNOSIS — O134 Gestational [pregnancy-induced] hypertension without significant proteinuria, complicating childbirth: Principal | ICD-10-CM | POA: Diagnosis present

## 2017-10-07 DIAGNOSIS — O1490 Unspecified pre-eclampsia, unspecified trimester: Secondary | ICD-10-CM | POA: Diagnosis present

## 2017-10-07 DIAGNOSIS — Z3493 Encounter for supervision of normal pregnancy, unspecified, third trimester: Secondary | ICD-10-CM

## 2017-10-07 LAB — CBC
HCT: 36.3 % (ref 36.0–46.0)
HEMATOCRIT: 36.2 % (ref 36.0–46.0)
Hemoglobin: 11.8 g/dL — ABNORMAL LOW (ref 12.0–15.0)
Hemoglobin: 11.8 g/dL — ABNORMAL LOW (ref 12.0–15.0)
MCH: 26.6 pg (ref 26.0–34.0)
MCH: 26.6 pg (ref 26.0–34.0)
MCHC: 32.5 g/dL (ref 30.0–36.0)
MCHC: 32.6 g/dL (ref 30.0–36.0)
MCV: 81.8 fL (ref 78.0–100.0)
MCV: 81.7 fL (ref 78.0–100.0)
PLATELETS: 200 10*3/uL (ref 150–400)
PLATELETS: 201 10*3/uL (ref 150–400)
RBC: 4.44 MIL/uL (ref 3.87–5.11)
RBC: 4.43 MIL/uL (ref 3.87–5.11)
RDW: 15 % (ref 11.5–15.5)
RDW: 14.7 % (ref 11.5–15.5)
WBC: 12.5 10*3/uL — ABNORMAL HIGH (ref 4.0–10.5)
WBC: 9.8 10*3/uL (ref 4.0–10.5)

## 2017-10-07 LAB — COMPREHENSIVE METABOLIC PANEL
ALBUMIN: 2.4 g/dL — AB (ref 3.5–5.0)
ALK PHOS: 95 U/L (ref 38–126)
ALT: 11 U/L — ABNORMAL LOW (ref 14–54)
AST: 21 U/L (ref 15–41)
Anion gap: 7 (ref 5–15)
BILIRUBIN TOTAL: 0.5 mg/dL (ref 0.3–1.2)
BUN: 11 mg/dL (ref 6–20)
CALCIUM: 8.5 mg/dL — AB (ref 8.9–10.3)
CO2: 20 mmol/L — ABNORMAL LOW (ref 22–32)
Chloride: 109 mmol/L (ref 101–111)
Creatinine, Ser: 0.55 mg/dL (ref 0.44–1.00)
GFR calc Af Amer: >60 mL/min (ref >60)
GFR calc non Af Amer: >60 mL/min (ref >60)
GLUCOSE: 119 mg/dL — AB (ref 65–99)
Potassium: 3.6 mmol/L (ref 3.5–5.1)
Sodium: 136 mmol/L (ref 135–145)
Total Protein: 5.3 g/dL — ABNORMAL LOW (ref 6.5–8.1)

## 2017-10-07 LAB — TYPE AND SCREEN
ABO/RH(D): A POS
ANTIBODY SCREEN: NEGATIVE

## 2017-10-07 LAB — RPR: RPR Ser Ql: NONREACTIVE

## 2017-10-07 IMAGING — US US MFM OB LIMITED
1 series · 14 of 14 positions shown · non-contrast
Comparison: none

[Series 1: us mfm ob limited · 14 of 14 slices shown]
[im 1/14]
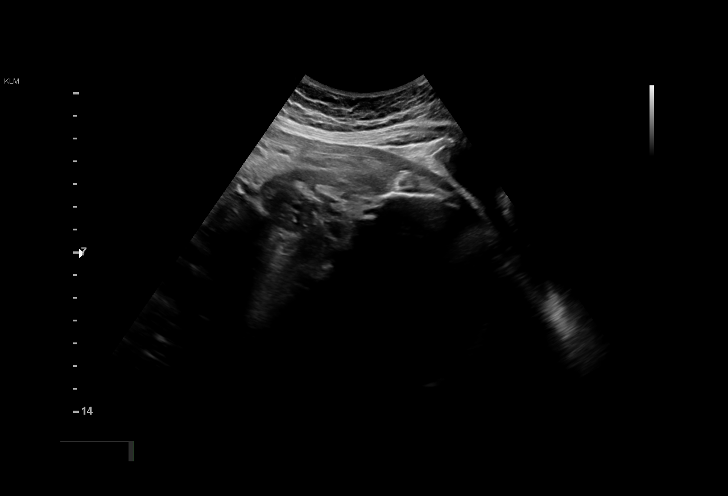
[im 2/14]
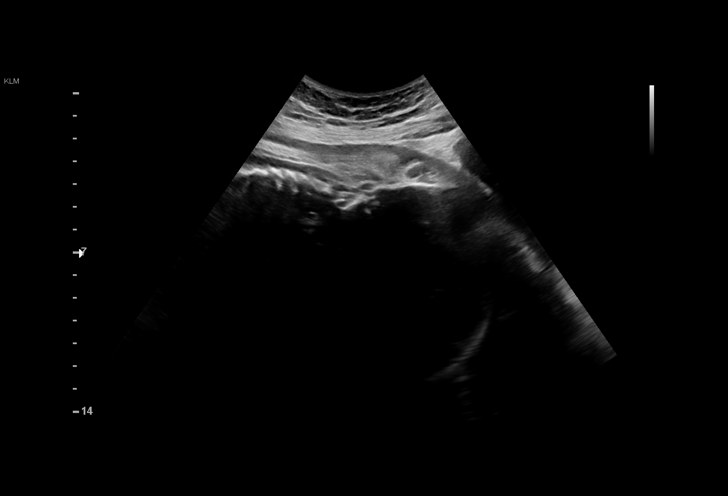
[im 3/14]
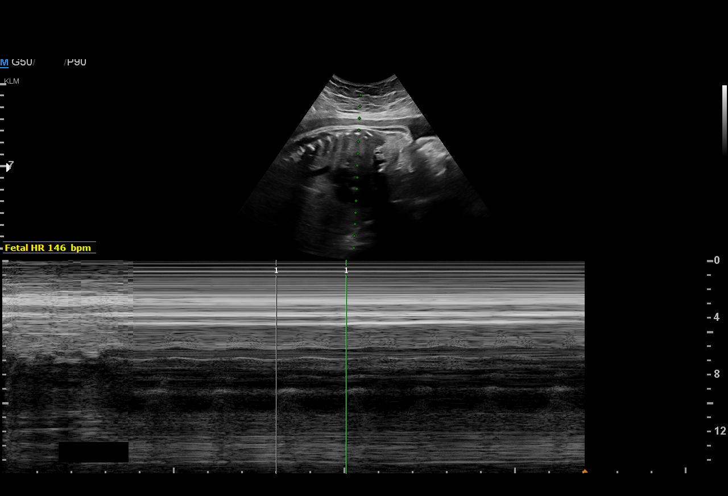
[im 4/14]
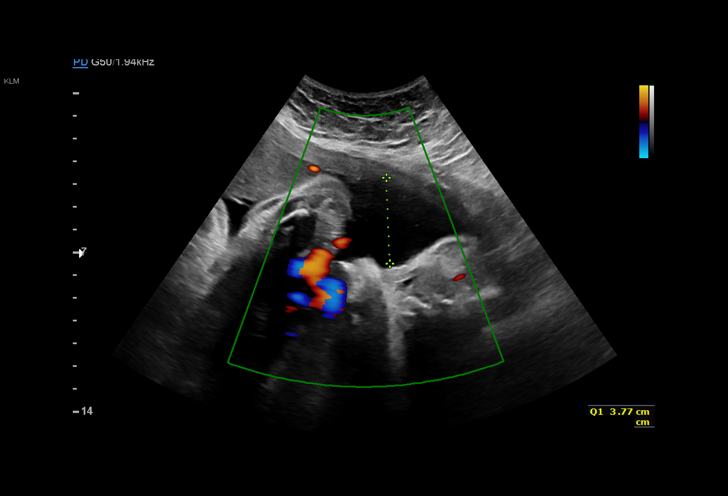
[im 5/14]
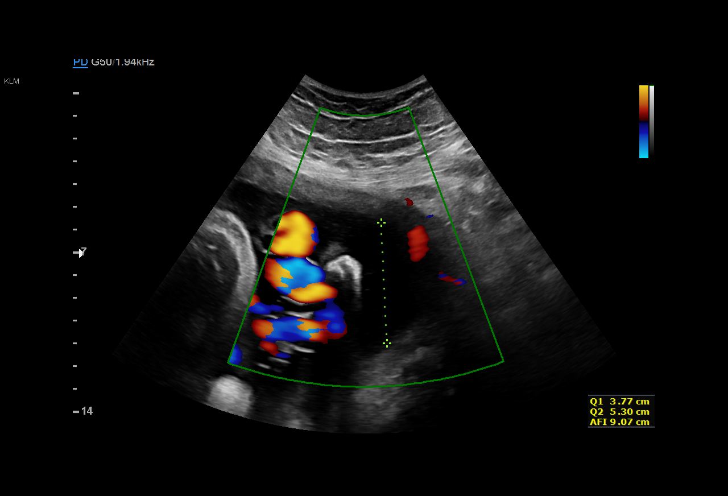
[im 6/14]
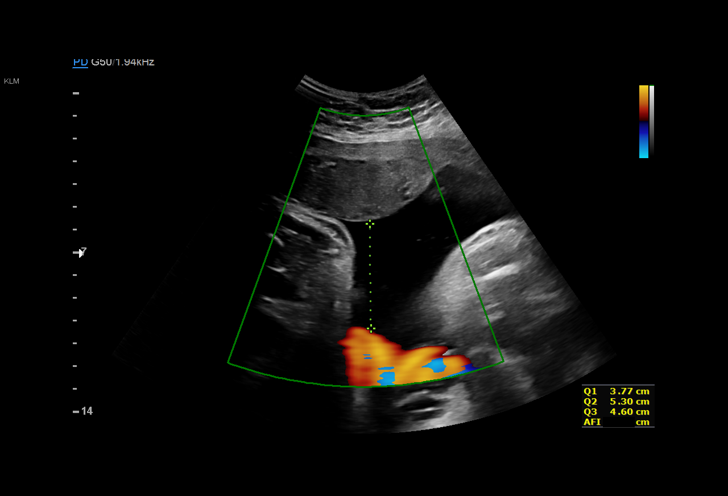
[im 7/14]
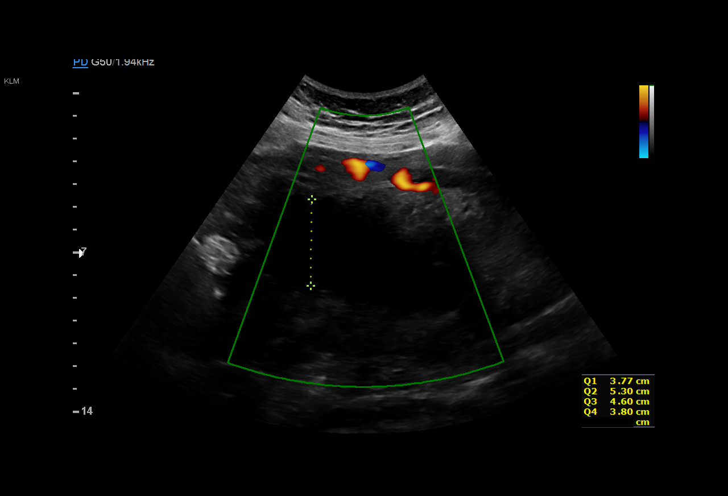
[im 8/14]
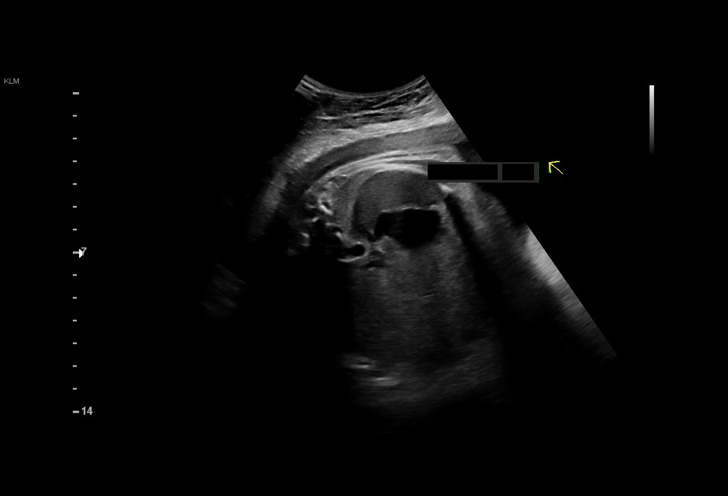
[im 9/14]
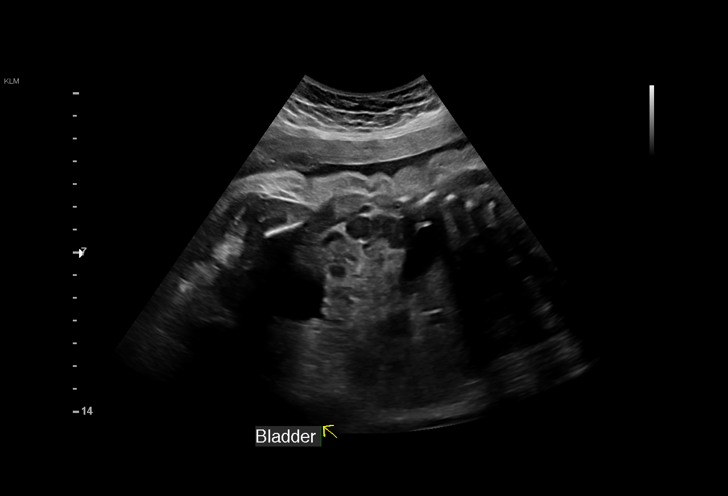
[im 10/14]
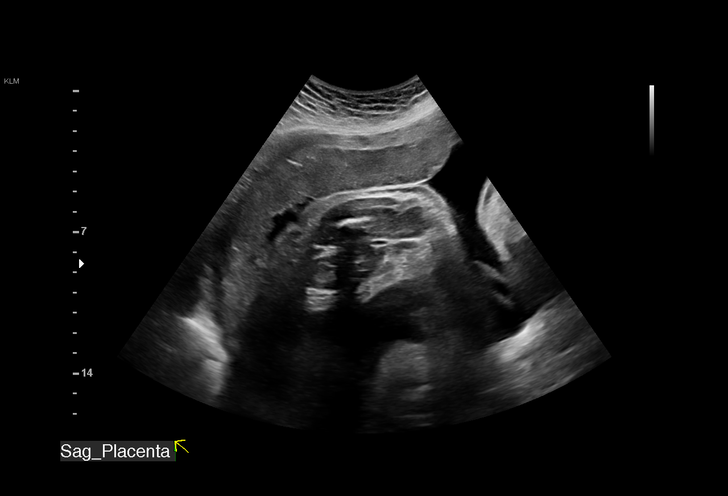
[im 11/14]
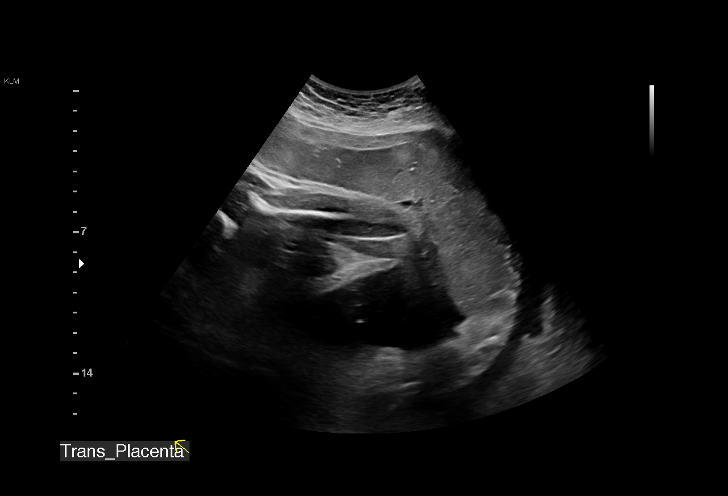
[im 12/14]
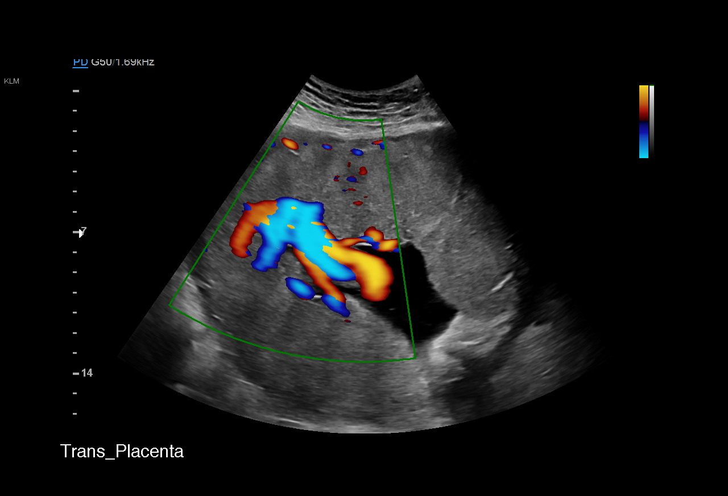
[im 13/14]
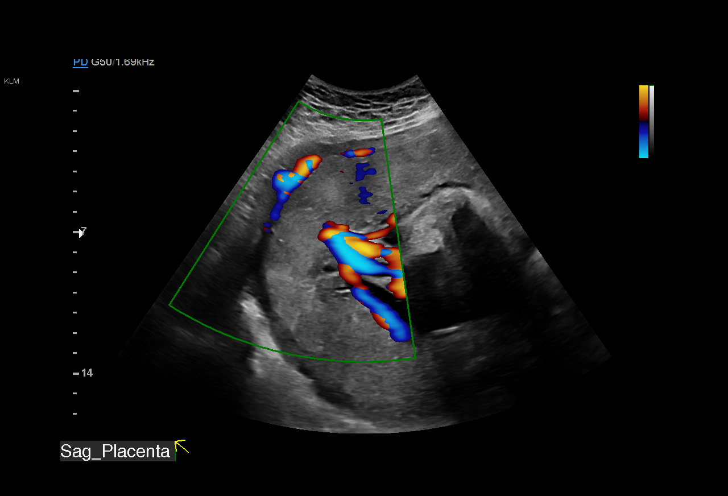
[im 14/14]
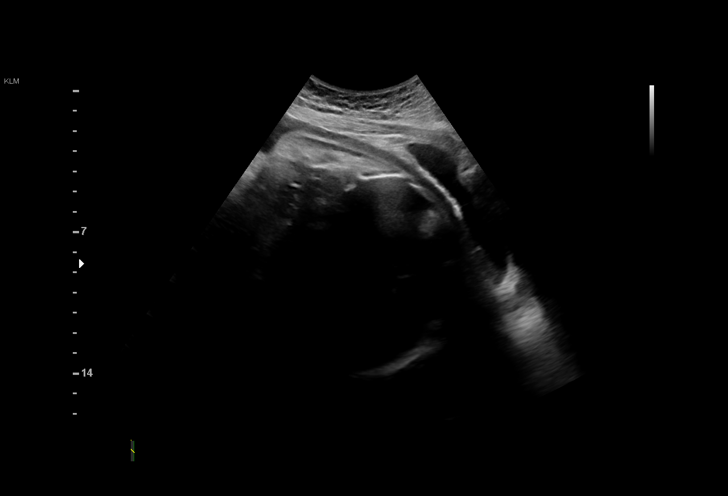

[14 of 14 positions shown; findings below may reference images not displayed]

1  BRA QWAMI CARBOO         [id number]      [id number]     [id number]
Indications

37 weeks gestation of pregnancy
Determine Fetal presentation by ultrasound
Pre-eclampsia
OB History

Gravidity:    3         Term:   2        Prem:   0        SAB:   0
TOP:          0       Ectopic:  0        Living: 2
Fetal Evaluation

Num Of Fetuses:     1
Fetal Heart         146
Rate(bpm):
Cardiac Activity:   Observed
Presentation:       Cephalic
Placenta:           Fundal, above cervical os
P. Cord Insertion:  Visualized

Amniotic Fluid
AFI FV:      Subjectively within normal limits

AFI Sum(cm)     %Tile       Largest Pocket(cm)
17.47           66

RUQ(cm)       RLQ(cm)       LUQ(cm)        LLQ(cm)
3.77
Gestational Age

Clinical EDD:  37w 0d                                        EDD:   10/28/17
Best:          37w 0d     Det. By:  Clinical EDD             EDD:   10/28/17
Anatomy

Stomach:               Appears normal, left   Bladder:                Appears normal
sided
Cervix Uterus Adnexa

Cervix
Not visualized (advanced GA >60wks)

Adnexa:       No abnormality visualized.
Impression

SIUP at 37+0 weeks
Cephalic presentation
Normal amniotic fluid volume
Recommendations

Follow-up as clinically indicated

## 2017-10-07 MED ORDER — FENTANYL 2.5 MCG/ML BUPIVACAINE 1/10 % EPIDURAL INFUSION (WH - ANES)
INTRAMUSCULAR | Status: AC
  Filled 2017-10-07: qty 100

## 2017-10-07 MED ORDER — WITCH HAZEL-GLYCERIN EX PADS: 1.0000 "application " | MEDICATED_PAD | CUTANEOUS | Status: DC | PRN

## 2017-10-07 MED ORDER — TETANUS-DIPHTH-ACELL PERTUSSIS 5-2.5-18.5 LF-MCG/0.5 IM SUSP: 0.5000 mL | Freq: Once | INTRAMUSCULAR | Status: DC

## 2017-10-07 MED ORDER — FERROUS SULFATE 325 (65 FE) MG PO TABS
325.0000 mg | ORAL_TABLET | Freq: Two times a day (BID) | ORAL | Status: DC
  Administered 2017-10-07 – 2017-10-08 (×2): 325 mg via ORAL
  Filled 2017-10-07 (×2): qty 1

## 2017-10-07 MED ORDER — ACETAMINOPHEN 325 MG PO TABS
650.0000 mg | ORAL_TABLET | ORAL | Status: DC | PRN
  Administered 2017-10-07: 650 mg via ORAL
  Filled 2017-10-07: qty 2

## 2017-10-07 MED ORDER — FENTANYL 2.5 MCG/ML BUPIVACAINE 1/10 % EPIDURAL INFUSION (WH - ANES)
INTRAMUSCULAR | Status: DC | PRN
  Administered 2017-10-07: 14 mL/h via EPIDURAL

## 2017-10-07 MED ORDER — ONDANSETRON HCL 4 MG/2ML IJ SOLN: 4.0000 mg | INTRAMUSCULAR | Status: DC | PRN

## 2017-10-07 MED ORDER — PHENYLEPHRINE 40 MCG/ML (10ML) SYRINGE FOR IV PUSH (FOR BLOOD PRESSURE SUPPORT)
PREFILLED_SYRINGE | INTRAVENOUS | Status: AC
  Filled 2017-10-07: qty 20

## 2017-10-07 MED ORDER — SODIUM CHLORIDE 0.9% FLUSH
3.0000 mL | Freq: Two times a day (BID) | INTRAVENOUS | Status: DC
  Administered 2017-10-07: 3 mL via INTRAVENOUS

## 2017-10-07 MED ORDER — BENZOCAINE-MENTHOL 20-0.5 % EX AERO: 1.0000 "application " | INHALATION_SPRAY | CUTANEOUS | Status: DC | PRN

## 2017-10-07 MED ORDER — LACTATED RINGERS IV SOLN
INTRAVENOUS | Status: DC
  Administered 2017-10-07: 14:00:00 via INTRAVENOUS
  Administered 2017-10-07: 125 mL/h via INTRAVENOUS

## 2017-10-07 MED ORDER — OXYTOCIN 40 UNITS IN LACTATED RINGERS INFUSION - SIMPLE MED
1.0000 m[IU]/min | INTRAVENOUS | Status: DC
  Administered 2017-10-07: 2 m[IU]/min via INTRAVENOUS

## 2017-10-07 MED ORDER — IBUPROFEN 800 MG PO TABS
800.0000 mg | ORAL_TABLET | Freq: Three times a day (TID) | ORAL | Status: DC
  Administered 2017-10-07 – 2017-10-08 (×4): 800 mg via ORAL
  Filled 2017-10-07 (×4): qty 1

## 2017-10-07 MED ORDER — LIDOCAINE HCL (PF) 1 % IJ SOLN
INTRAMUSCULAR | Status: DC | PRN
  Administered 2017-10-07 (×2): 5 mL

## 2017-10-07 MED ORDER — DIBUCAINE 1 % RE OINT: 1.0000 "application " | TOPICAL_OINTMENT | RECTAL | Status: DC | PRN

## 2017-10-07 MED ORDER — TERBUTALINE SULFATE 1 MG/ML IJ SOLN
0.2500 mg | Freq: Once | INTRAMUSCULAR | Status: DC | PRN
  Filled 2017-10-07: qty 1

## 2017-10-07 MED ORDER — OXYCODONE-ACETAMINOPHEN 5-325 MG PO TABS: 2.0000 | ORAL_TABLET | ORAL | Status: DC | PRN

## 2017-10-07 MED ORDER — OXYTOCIN BOLUS FROM INFUSION
500.0000 mL | Freq: Once | INTRAVENOUS | Status: AC
  Administered 2017-10-07: 500 mL via INTRAVENOUS

## 2017-10-07 MED ORDER — COCONUT OIL OIL: 1.0000 "application " | TOPICAL_OIL | Status: DC | PRN

## 2017-10-07 MED ORDER — SOD CITRATE-CITRIC ACID 500-334 MG/5ML PO SOLN: 30.0000 mL | ORAL | Status: DC | PRN

## 2017-10-07 MED ORDER — LIDOCAINE HCL (PF) 1 % IJ SOLN
30.0000 mL | INTRAMUSCULAR | Status: DC | PRN
  Filled 2017-10-07: qty 30

## 2017-10-07 MED ORDER — DIPHENHYDRAMINE HCL 25 MG PO CAPS: 25.0000 mg | ORAL_CAPSULE | Freq: Four times a day (QID) | ORAL | Status: DC | PRN

## 2017-10-07 MED ORDER — ONDANSETRON HCL 4 MG PO TABS: 4.0000 mg | ORAL_TABLET | ORAL | Status: DC | PRN

## 2017-10-07 MED ORDER — SODIUM CHLORIDE 0.9% FLUSH: 3.0000 mL | INTRAVENOUS | Status: DC | PRN

## 2017-10-07 MED ORDER — METHYLERGONOVINE MALEATE 0.2 MG/ML IJ SOLN: 0.2000 mg | INTRAMUSCULAR | Status: DC | PRN

## 2017-10-07 MED ORDER — PRENATAL MULTIVITAMIN CH
1.0000 | ORAL_TABLET | Freq: Every day | ORAL | Status: DC
  Administered 2017-10-08: 1 via ORAL
  Filled 2017-10-07: qty 1

## 2017-10-07 MED ORDER — SIMETHICONE 80 MG PO CHEW: 80.0000 mg | CHEWABLE_TABLET | ORAL | Status: DC | PRN

## 2017-10-07 MED ORDER — MEASLES, MUMPS & RUBELLA VAC ~~LOC~~ INJ: 0.5000 mL | INJECTION | Freq: Once | SUBCUTANEOUS | Status: DC

## 2017-10-07 MED ORDER — LACTATED RINGERS IV SOLN
500.0000 mL | INTRAVENOUS | Status: DC | PRN
  Administered 2017-10-07: 500 mL via INTRAVENOUS

## 2017-10-07 MED ORDER — SENNOSIDES-DOCUSATE SODIUM 8.6-50 MG PO TABS
2.0000 | ORAL_TABLET | ORAL | Status: DC
  Administered 2017-10-08: 2 via ORAL
  Filled 2017-10-07: qty 2

## 2017-10-07 MED ORDER — OXYTOCIN 40 UNITS IN LACTATED RINGERS INFUSION - SIMPLE MED
2.5000 [IU]/h | INTRAVENOUS | Status: DC
  Filled 2017-10-07: qty 1000

## 2017-10-07 MED ORDER — ONDANSETRON HCL 4 MG/2ML IJ SOLN: 4.0000 mg | Freq: Four times a day (QID) | INTRAMUSCULAR | Status: DC | PRN

## 2017-10-07 MED ORDER — SODIUM CHLORIDE 0.9 % IV SOLN: 250.0000 mL | INTRAVENOUS | Status: DC | PRN

## 2017-10-07 MED ORDER — MAGNESIUM HYDROXIDE 400 MG/5ML PO SUSP: 30.0000 mL | ORAL | Status: DC | PRN

## 2017-10-07 MED ORDER — ZOLPIDEM TARTRATE 5 MG PO TABS: 5.0000 mg | ORAL_TABLET | Freq: Every evening | ORAL | Status: DC | PRN

## 2017-10-07 MED ORDER — METHYLERGONOVINE MALEATE 0.2 MG PO TABS: 0.2000 mg | ORAL_TABLET | ORAL | Status: DC | PRN

## 2017-10-07 NOTE — Progress Notes (Signed)
Patient arrived to unit with a BP of 156/89. At the 1 hour recheck, BP increased to 166/90. Patient told RN that with her 3318 month old she had to receive postpartum mag for increased BPs after delivery. MD paged and wants BPs every 15 minutes for the next hour and then call with results. Will continue to monitor.

## 2017-10-07 NOTE — H&P (Signed)
31 y.o. 747w0d  [id number] comes in for induction at term for gestational hypertension.  Otherwise has good fetal movement and no bleeding.      Past Medical History:  Diagnosis Date  . Anxiety   . 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 Hx of preeclampsia, prior pregnancy, currently pregnant   . 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 Pre-eclampsia   . Vaginal Pap smear, abnormal          Past Surgical History:  Procedure Laterality Date  . COLPOSCOPY  05/2011   lgsil  . NO PAST SURGERIES    . WISDOM TOOTH EXTRACTION                      OB History  Gravida Para Term Preterm AB Living  3 2 0 2   2  SAB TAB Ectopic Multiple Live Births  0     0 2    # Outcome Date GA Lbr Len/2nd Weight Sex Delivery Anes PTL Lv  3 Current           2 Preterm 03/22/16 7312w1d / 00:21 6 lb 7.5 oz (2.935 kg) F Vag-Spont EPI  LIV     Birth Comments: none  1 Preterm 08/14/06 6390w0d   M    LIV    Obstetric Comments  IOL for G1 & G2 d/t preeclampsia    Social History        Socioeconomic History  . Marital status: Married    Spouse name: Not on file  . Number of children: Not on file  . Years of education: Not on file  . Highest education level: Not on file  Social Needs  . Financial resource strain: Not on file  . Food insecurity - worry: Not on file  . Food insecurity - inability: Not on file  . Transportation needs - medical: Not on file  . Transportation needs - non-medical: Not on file  Occupational History  . Not on file  Tobacco Use  . Smoking status: Never Smoker  . Smokeless tobacco: Never Used  Substance and Sexual Activity  . Alcohol use: No    Alcohol/week: 0.0 oz    Frequency: Never    Comment: 3-4 times a week  . Drug use: No    Comment: past use  . Sexual activity: Yes    Partners: Male    Birth control/protection: None   Other Topics Concern  . Not on file  Social History Narrative  . Not on file   Patient has no known allergies.    Prenatal Transfer Tool  Maternal Diabetes: No Genetic Screening: Declined Maternal Ultrasounds/Referrals: Normal Fetal Ultrasounds or other Referrals:  None Maternal Substance Abuse:  No Significant Maternal Medications:  Meds include: Other: ASA Significant Maternal Lab Results: None  Other PNC: uncomplicated except for mild gestational hypertension. Labs normal on 12-16.         Vitals:   10/07/17 0833 10/07/17 0909  BP: 128/76 127/81  Pulse: 84 88  Resp: 18 18  Temp:       Lungs/Cor:  NAD Abdomen:  soft, gravid Ex:  no cords, erythema SVE:  2/60/-2, AROM clear FHTs:  130, good STV, NST R Toco:  q  4-7   A/P   Term induction for GHTN.             GBS neg.  Dimitry Holsworth A

## 2017-10-07 NOTE — Anesthesia Preprocedure Evaluation (Signed)
Anesthesia Evaluation  Patient identified by MRN, date of birth, ID band Patient awake    Reviewed: Allergy & Precautions, H&P , NPO status , Patient's Chart, lab work & pertinent test results  History of Anesthesia Complications Negative for: history of anesthetic complications  Airway Mallampati: II  TM Distance: >3 FB Neck ROM: full    Dental no notable dental hx. (+) Teeth Intact   Pulmonary neg pulmonary ROS,    Pulmonary exam normal breath sounds clear to auscultation       Cardiovascular hypertension, negative cardio ROS Normal cardiovascular exam Rhythm:regular Rate:Normal     Neuro/Psych negative neurological ROS  negative psych ROS   GI/Hepatic negative GI ROS, Neg liver ROS,   Endo/Other  negative endocrine ROS  Renal/GU negative Renal ROS  negative genitourinary   Musculoskeletal   Abdominal   Peds  Hematology negative hematology ROS (+)   Anesthesia Other Findings   Reproductive/Obstetrics (+) Pregnancy                             Anesthesia Physical Anesthesia Plan  ASA: II  Anesthesia Plan: Epidural   Post-op Pain Management:    Induction:   PONV Risk Score and Plan:   Airway Management Planned:   Additional Equipment:   Intra-op Plan:   Post-operative Plan:   Informed Consent: I have reviewed the patients History and Physical, chart, labs and discussed the procedure including the risks, benefits and alternatives for the proposed anesthesia with the patient or authorized representative who has indicated his/her understanding and acceptance.     Plan Discussed with:   Anesthesia Plan Comments:         Anesthesia Quick Evaluation  

## 2017-10-07 NOTE — Progress Notes (Signed)
31 y.o. [redacted]w[redacted]d  G3P0202 comes in for induction at term for gestational hypertension.  Otherwise has good fetal movement and no bleeding.      Past Medical History:  Diagnosis Date  . Anxiety   . 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 Hx of preeclampsia, prior pregnancy, currently pregnant   . 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 Pre-eclampsia   . Vaginal Pap smear, abnormal          Past Surgical History:  Procedure Laterality Date  . COLPOSCOPY  05/2011   lgsil  . NO PAST SURGERIES    . WISDOM TOOTH EXTRACTION                      OB History  Gravida Para Term Preterm AB Living  3 2 0 2   2  SAB TAB Ectopic Multiple Live Births  0     0 2    # Outcome Date GA Lbr Len/2nd Weight Sex Delivery Anes PTL Lv  3 Current           2 Preterm 03/22/16 [redacted]w[redacted]d / 00:21 6 lb 7.5 oz (2.935 kg) F Vag-Spont EPI  LIV     Birth Comments: none  1 Preterm 08/14/06 [redacted]w[redacted]d   M    LIV    Obstetric Comments  IOL for G1 & G2 d/t preeclampsia    Social History        Socioeconomic History  . Marital status: Married    Spouse name: Not on file  . Number of children: Not on file  . Years of education: Not on file  . Highest education level: Not on file  Social Needs  . Financial resource strain: Not on file  . Food insecurity - worry: Not on file  . Food insecurity - inability: Not on file  . Transportation needs - medical: Not on file  . Transportation needs - non-medical: Not on file  Occupational History  . Not on file  Tobacco Use  . Smoking status: Never Smoker  . Smokeless tobacco: Never Used  Substance and Sexual Activity  . Alcohol use: No    Alcohol/week: 0.0 oz    Frequency: Never    Comment: 3-4 times a week  . Drug use: No    Comment: past use  . Sexual activity: Yes    Partners: Male    Birth control/protection: None   Other Topics Concern  . Not on file  Social History Narrative  . Not on file   Patient has no known allergies.    Prenatal Transfer Tool  Maternal Diabetes: No Genetic Screening: Declined Maternal Ultrasounds/Referrals: Normal Fetal Ultrasounds or other Referrals:  None Maternal Substance Abuse:  No Significant Maternal Medications:  Meds include: Other: ASA Significant Maternal Lab Results: None  Other PNC: uncomplicated except for mild gestational hypertension. Labs normal on 12-16.         Vitals:   10/07/17 0833 10/07/17 0909  BP: 128/76 127/81  Pulse: 84 88  Resp: 18 18  Temp:       Lungs/Cor:  NAD Abdomen:  soft, gravid Ex:  no cords, erythema SVE:  2/60/-2, AROM clear FHTs:  130, good STV, NST R Toco:  q  4-7   A/P   Term induction for GHTN.             GBS neg.    

## 2017-10-07 NOTE — Progress Notes (Signed)
Followed up with MD after BP checks. BPs stayed in the 140s/90s.

## 2017-10-07 NOTE — Progress Notes (Signed)
Discussed plan of care with pt and spouse; going to go to ultrasound soon to check presenting part; if baby vertex, then will proceed with induction-start pitocin

## 2017-10-07 NOTE — Lactation Note (Signed)
This note was copied from a baby's chart. Lactation Consultation Note  Patient Name: Gabrielle Zavala Today's Date: 10/07/2017 Reason for consult: Initial assessment;Early term 37-38.6wks Breastfeeding consultation services and support information given and reviewed.  This is mom's third baby and newborn is 5 hours old.  Baby has latched and fed three times since birth.  Discussed possible sleepiness because baby is 37 weeks.  Recommended a lot of skin to skin.  Instructed to feed with cues and to call with questions/assist prn.  Maternal Data    Feeding Feeding Type: Breast Fed  LATCH Score Latch: Grasps breast easily, tongue down, lips flanged, rhythmical sucking.  Audible Swallowing: A few with stimulation  Type of Nipple: Everted at rest and after stimulation  Comfort (Breast/Nipple): Soft / non-tender  Hold (Positioning): Assistance needed to correctly position infant at breast and maintain latch.  LATCH Score: 8  Interventions    Lactation Tools Discussed/Used     Consult Status Consult Status: Follow-up Date: 10/08/17 Follow-up type: In-patient    Huston FoleyMOULDEN, Ajah Vanhoose S 10/07/2017, 7:49 PM

## 2017-10-07 NOTE — Anesthesia Procedure Notes (Signed)
Epidural Patient location during procedure: OB  Staffing Anesthesiologist: Taeveon Keesling, MD Performed: anesthesiologist   Preanesthetic Checklist Completed: patient identified, site marked, surgical consent, pre-op evaluation, timeout performed, IV checked, risks and benefits discussed and monitors and equipment checked  Epidural Patient position: sitting Prep: DuraPrep Patient monitoring: heart rate, continuous pulse ox and blood pressure Approach: right paramedian Location: L3-L4 Injection technique: LOR saline  Needle:  Needle type: Tuohy  Needle gauge: 17 G Needle length: 9 cm and 9 Needle insertion depth: 6 cm Catheter type: closed end flexible Catheter size: 20 Guage Catheter at skin depth: 10 cm Test dose: negative  Assessment Events: blood not aspirated, injection not painful, no injection resistance, negative IV test and no paresthesia  Additional Notes Patient identified. Risks/Benefits/Options discussed with patient including but not limited to bleeding, infection, nerve damage, paralysis, failed block, incomplete pain control, headache, blood pressure changes, nausea, vomiting, reactions to medication both or allergic, itching and postpartum back pain. Confirmed with bedside nurse the patient's most recent platelet count. Confirmed with patient that they are not currently taking any anticoagulation, have any bleeding history or any family history of bleeding disorders. Patient expressed understanding and wished to proceed. All questions were answered. Sterile technique was used throughout the entire procedure. Please see nursing notes for vital signs. Test dose was given through epidural needle and negative prior to continuing to dose epidural or start infusion. Warning signs of high block given to the patient including shortness of breath, tingling/numbness in hands, complete motor block, or any concerning symptoms with instructions to call for help. Patient was given  instructions on fall risk and not to get out of bed. All questions and concerns addressed with instructions to call with any issues.     

## 2017-10-07 NOTE — Anesthesia Pain Management Evaluation Note (Signed)
  CRNA Pain Management Visit Note  Patient: Gabrielle Zavala, 31 y.o., female  "[facility]. We have an anesthesia team available at all times to provide care throughout the hospital, including epidural management and anesthesia for C-section. I don't know your plan for the delivery whether it a natural birth, water birth, IV sedation, nitrous supplementation, doula or epidural, but we want to meet your pain goals."   1.Was your pain managed to your expectations on prior hospitalizations?   Yes   2.What is your expectation for pain management during this hospitalization?     Epidural  3.How can we help you reach that goal? epidural  Record the patient's initial score and the patient's pain goal.   Pain: 1  Pain Goal: 5 [facility] wants you to be able to say your pain was always managed very well.  Madison HickmanGREGORY,Gabrielle Zavala 10/07/2017

## 2017-10-08 LAB — CBC
HEMATOCRIT: 35.3 % — AB (ref 36.0–46.0)
Hemoglobin: 11.5 g/dL — ABNORMAL LOW (ref 12.0–15.0)
MCH: 26.5 pg (ref 26.0–34.0)
MCHC: 32.6 g/dL (ref 30.0–36.0)
MCV: 81.3 fL (ref 78.0–100.0)
PLATELETS: 182 10*3/uL (ref 150–400)
RBC: 4.34 MIL/uL (ref 3.87–5.11)
RDW: 15 % (ref 11.5–15.5)
WBC: 10.3 10*3/uL (ref 4.0–10.5)

## 2017-10-08 NOTE — Lactation Note (Signed)
This note was copied from a baby's chart. Lactation Consultation Note  Patient Name: Gabrielle Zavala ZOXWR'UToday's Date: 10/08/2017 Reason for consult: Follow-up assessment;Early term 37-38.6wks   Follow up with Exp BF mom of 24 hour old Early Term infant at mom's request. . Infant with 5 BF for 15-45 minutes, 4 BF attempts. 4 voids and 2 stools in last 24 hours. Infant last stool about 12 hours ago and last void was at 7 am.  [facility]. Infant weight 7 lb 3.7 oz with weight loss of 3.5 % by 16 hours of age.  Mom is very anxious and wondering if infant is getting enough to eat. Assisted mom with latch. Mom is latching in the cradle hold and infant is not maintaining a deep latch throughout feeding. Reviewed importance of latching in the cross cradle hold for deeper latch and maintaining head support throughout feeding. Infant needed a lot of stimulation to maintain suckling with feeding, infant irritable when stimulated. Infant noted to have one swallow in the 20 + minutes she was at the breast. We attempted awakening techniques and switching sides with feeding.   Mom with soft compressible breasts and everted nipples. We attempted to hand express for several minutes, able to express 2 gtts from the right breast that was fed to infant on finger, unable to express colostrum from left breast. Mom reports she pumped once with DEBP and obtained a gtt from the right breast.   Mom still very concerned infant is not feeding well enough. Mom reports infant received formula during the night, although it is not documented. Mom asked for infant to be supplemented after feeding. Reviewed LEAD teaching with mom and risks of formula. Mom still wanted to start formula. Discussed using a syringe and mom reports she would like to use a bottle. Reviewed supplementation amounts based on day of age and handout given, discussed giving 15 cc for 2 feeds, then increasing to 20 cc x 2 feeds and continue to increase up to  the max of 30 cc per feeding. Discussed increasing feeding amounts per day of age. Showed mom how to pace bottle feed infant. Mom was very nervous feeding infant and was scared infant was choking, infant did do better with flow being fed on her side as LC was doing vs on her back as mom placed her to bottle feed. Mom did finish feeding on infant's side. Mom was tearful and concerned she is starving her infant since infant took bottle so easily, discussed with mom that infant is alert and has had good output. Mom anxious about burping infant also.   Reviewed importance of putting infant to the breast with each feeding to stimulate milk supply. Enc mom to supplement post BF with EBM or formula as needed. Reviewed importance of pumping about every 3 hours for 15 minutes to simulate milk production. Mom has a Spectra 2 pump at home to use. Mom reports she does not like to pump.   Mom is still nursing her 2718 month old, discussed importance of making sure infant is fed before allowing 5418 month old to nurse. Mom voiced understanding.   Reviewed I/O, signs of dehydration in the infant, engorgement prevention/treatment, and breast milk expression and storage.   Enc mom to come back for OP LC visit, mom would like to call and schedule at a later time. Infant with follow up Ped appt tomorrow morning. Mom has LC phone # to call with any questions/concerns. Mom aware of OP services  and BF Support Groups.   Mom reports all questions have been answered at this time.   Report to [facility] LLC[facility]loe Winecoff, RN.    Maternal Data Has patient been taught Hand Expression?: Yes Does the patient have breastfeeding experience prior to this delivery?: Yes  Feeding Feeding Type: Breast Fed Length of feed: 20 min  LATCH Score Latch: Repeated attempts needed to sustain latch, nipple held in mouth throughout feeding, stimulation needed to elicit sucking reflex.  Audible Swallowing: None(heard one swallow in 20 minutes of  attempting to BF infant)  Type of Nipple: Everted at rest and after stimulation  Comfort (Breast/Nipple): Soft / non-tender  Hold (Positioning): Assistance needed to correctly position infant at breast and maintain latch.  LATCH Score: 6  Interventions Interventions: Breast feeding basics reviewed;Support pillows;Assisted with latch;Position options;Skin to skin;Expressed milk;Hand express;Breast compression;DEBP  Lactation Tools Discussed/Used Tools: Pump WIC Program: No Pump Review: Setup, frequency, and cleaning;Milk Storage Initiated by:: Reviewed and encouraged every 2-3 hours post BF   Consult Status Consult Status: Complete Follow-up type: Call as needed    Ed BlalockSharon S Aunisty Reali 10/08/2017, 2:39 PM

## 2017-10-08 NOTE — Progress Notes (Addendum)
Patient is eating, ambulating, voiding.  Pain control is good.  Vitals:   10/07/17 1915 10/07/17 2100 10/08/17 0047 10/08/17 0645  BP: (!) 144/92 (!) 143/86 135/85 (!) 131/93  Pulse: 67 65 69 73  Resp:  18 18 18   Temp:  98.4 F (36.9 C) 98.1 F (36.7 C) 97.8 F (36.6 C)  TempSrc:   Oral Axillary  Weight:      Height:        Fundus firm Perineum without swelling.  Lab Results  Component Value Date   WBC 10.3 10/08/2017   HGB 11.5 (L) 10/08/2017   HCT 35.3 (L) 10/08/2017   MCV 81.3 10/08/2017   PLT 182 10/08/2017    --/--/A POS (12/22 0210)/RI  A/P Post partum day 1. CMET was normal yesterday and BPs are stable.   Routine care.  Expect d/c routine.    Dontea Corlew A

## 2017-10-08 NOTE — Progress Notes (Signed)
MOB was referred for history of depression/anxiety. * Referral screened out by Clinical Social Worker because none of the following criteria appear to apply: ~ History of anxiety/depression during this pregnancy, or of post-partum depression. ~ Diagnosis of anxiety and/or depression within last 3 years OR * MOB's symptoms currently being treated with medication and/or therapy. Please contact the Clinical Social Worker if needs arise, or if MOB requests.  Diagnosis noted in 2012.  PNR notes Rx for Xanax PRN by history and currently not taking medication.  Aware of resources if needing medication.   Deretha EmoryHannah Ailani Governale LCSW, MSW Clinical Social Work: Optician, [employer]ystem [employer]

## 2017-10-08 NOTE — Discharge Summary (Signed)
Obstetric Discharge Summary Reason for Admission: induction of labor Prenatal Procedures: GHTN Intrapartum Procedures: spontaneous vaginal delivery Postpartum Procedures: none Complications-Operative and Postpartum: none Hemoglobin  Date Value Ref Range Status  10/08/2017 11.5 (L) 12.0 - 15.0 g/dL Final   HCT  Date Value Ref Range Status  10/08/2017 35.3 (L) 36.0 - 46.0 % Final    Discharge Diagnoses: Term Pregnancy-delivered  Discharge Information: Date: 10/08/2017 Activity: pelvic rest Diet: routine Medications: Ibuprofen Condition: stable Instructions: refer to practice specific booklet Discharge to: home Follow-up Information    Carrington ClampHorvath, Carleigh Buccieri, MD Follow up in 2 day(s).   Specialty:  Obstetrics and Gynecology Why:  BP check Contact information: [address] [place] [id number] [phone]           Newborn Data: Live born female  Birth Weight: 7 lb 7.9 oz (3400 g) APGAR: 8, 9  Newborn Delivery   Time head delivered:  10/07/2017 13:58:00 Birth date/time:  10/07/2017 13:59:00 Delivery type:  Vaginal, Spontaneous     Home with mother.  Doryan Bahl A 10/08/2017, 8:56 AM

## 2017-10-08 NOTE — Anesthesia Postprocedure Evaluation (Signed)
Anesthesia Post Note  Patient: Fernande BoydenMaria L Kizziah  Procedure(s) Performed: AN AD HOC LABOR EPIDURAL     Patient location during evaluation: Mother Baby Anesthesia Type: Epidural Level of consciousness: awake and alert and oriented Pain management: satisfactory to patient Vital Signs Assessment: post-procedure vital signs reviewed and stable Respiratory status: spontaneous breathing and nonlabored ventilation Cardiovascular status: stable Postop Assessment: no headache, no backache, no signs of nausea or vomiting, adequate PO intake and patient able to bend at knees (patient up walking) Anesthetic complications: no    Last Vitals:  Vitals:   10/08/17 0047 10/08/17 0645  BP: 135/85 (!) 131/93  Pulse: 69 73  Resp: 18 18  Temp: 36.7 C 36.6 C    Last Pain:  Vitals:   10/08/17 0645  TempSrc: Axillary  PainSc:    Pain Goal: Patients Stated Pain Goal: 5 (10/07/17 0120)               Madison HickmanGREGORY,Marquan Vokes

## 2017-10-15 ENCOUNTER — Inpatient Hospital Stay (HOSPITAL_COMMUNITY)
Admission: AD | Admit: 2017-10-15 | Discharge: 2017-10-15 | Disposition: A | Discharge status: Home / Self Care | Payer: 59 | Source: Ambulatory Visit | Attending: Obstetrics and Gynecology | Admitting: Obstetrics and Gynecology

## 2017-10-15 ENCOUNTER — Encounter (HOSPITAL_COMMUNITY): Payer: Self-pay

## 2017-10-15 ENCOUNTER — Other Ambulatory Visit: Payer: Self-pay

## 2017-10-15 DIAGNOSIS — G44209 Tension-type headache, unspecified, not intractable: Secondary | ICD-10-CM

## 2017-10-15 DIAGNOSIS — O165 Unspecified maternal hypertension, complicating the puerperium: Secondary | ICD-10-CM | POA: Insufficient documentation

## 2017-10-15 DIAGNOSIS — O1494 Unspecified pre-eclampsia, complicating childbirth: Secondary | ICD-10-CM

## 2017-10-15 DIAGNOSIS — R51 Headache: Secondary | ICD-10-CM | POA: Diagnosis not present

## 2017-10-15 DIAGNOSIS — Z87410 Personal history of cervical dysplasia: Secondary | ICD-10-CM | POA: Diagnosis not present

## 2017-10-15 DIAGNOSIS — I10 Essential (primary) hypertension: Secondary | ICD-10-CM | POA: Diagnosis present

## 2017-10-15 LAB — CBC
HCT: 41.5 % (ref 36.0–46.0)
Hemoglobin: 13.4 g/dL (ref 12.0–15.0)
MCH: 26.7 pg (ref 26.0–34.0)
MCHC: 32.3 g/dL (ref 30.0–36.0)
MCV: 82.7 fL (ref 78.0–100.0)
Platelets: 330 10*3/uL (ref 150–400)
RBC: 5.02 MIL/uL (ref 3.87–5.11)
RDW: 15.5 % (ref 11.5–15.5)
WBC: 9.6 10*3/uL (ref 4.0–10.5)

## 2017-10-15 LAB — COMPREHENSIVE METABOLIC PANEL
ALT: 34 U/L (ref 14–54)
AST: 21 U/L (ref 15–41)
Albumin: 3.6 g/dL (ref 3.5–5.0)
Alkaline Phosphatase: 90 U/L (ref 38–126)
Anion gap: 11 (ref 5–15)
BUN: 12 mg/dL (ref 6–20)
CO2: 20 mmol/L — ABNORMAL LOW (ref 22–32)
Calcium: 8.7 mg/dL — ABNORMAL LOW (ref 8.9–10.3)
Chloride: 103 mmol/L (ref 101–111)
Creatinine, Ser: 0.72 mg/dL (ref 0.44–1.00)
GFR calc Af Amer: >60 mL/min (ref >60)
GFR calc non Af Amer: >60 mL/min (ref >60)
Glucose, Bld: 98 mg/dL (ref 65–99)
Potassium: 4 mmol/L (ref 3.5–5.1)
Sodium: 134 mmol/L — ABNORMAL LOW (ref 135–145)
Total Bilirubin: 0.5 mg/dL (ref 0.3–1.2)
Total Protein: 7.7 g/dL (ref 6.5–8.1)

## 2017-10-15 LAB — PROTEIN / CREATININE RATIO, URINE
Creatinine, Urine: 35 mg/dL
Total Protein, Urine: <6 mg/dL

## 2017-10-15 MED ORDER — LABETALOL HCL 100 MG PO TABS: 200.0000 mg | ORAL_TABLET | Freq: Two times a day (BID) | ORAL | 2 refills | Status: DC

## 2017-10-15 MED ORDER — ACETAMINOPHEN 500 MG PO TABS
1000.0000 mg | ORAL_TABLET | Freq: Once | ORAL | Status: AC
  Administered 2017-10-15: 1000 mg via ORAL
  Filled 2017-10-15: qty 2

## 2017-10-15 MED ORDER — ACETAMINOPHEN 325 MG PO TABS: 1000.0000 mg | ORAL_TABLET | Freq: Four times a day (QID) | ORAL | Status: DC | PRN

## 2017-10-15 MED ORDER — LABETALOL HCL 100 MG PO TABS
200.0000 mg | ORAL_TABLET | Freq: Once | ORAL | Status: AC
  Administered 2017-10-15: 200 mg via ORAL
  Filled 2017-10-15: qty 2

## 2017-10-15 NOTE — MAU Provider Note (Signed)
Chief Complaint: Hypertension (PP )  First Provider Initiated Contact with Patient 10/15/17 1040      SUBJECTIVE HPI: Gabrielle Zavala is a 31 y.o. [phone] PP patient that delivered vaginally on 12/22 who presents to maternity admissions reporting Hypertension. She reports having a headache for the past two days, has taken Ibuprofen 600 and Tylenol 325 for treatment of HA with no relief. She reports checking her BP today where it was elevated 163/110. Associated symptoms include vision changes two days ago, states "she was seeing stars and just doesn't feel well". She denies epigastric pain, palpitations, peripheral edema, SOB, sweats, anxiety and visual changes currently. She has a hx of preeclampsia with prior two pregnancies. Denies treatment for BP with this pregnancy. Is not currently on BP medication and no Magnesium treatment during labor or PP. She denies vaginal bleeding, vaginal itching/burning, urinary symptoms, dizziness, n/v, or fever/chills.      Past Medical History:  Diagnosis Date  . Anxiety   . 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 Hx of preeclampsia, prior pregnancy, currently pregnant   . 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 Pre-eclampsia   . Vaginal Pap smear, abnormal    Past Surgical History:  Procedure Laterality Date  . COLPOSCOPY  05/2011   lgsil  . NO PAST SURGERIES    . WISDOM TOOTH EXTRACTION     Social History   Socioeconomic History  . Marital status: Married    Spouse name: Not on file  . Number of children: Not on file  . Years of education: Not on file  . Highest education level: Not on file  Social Needs  . Financial resource strain: Not on file  . Food insecurity - worry: Not on file  . Food insecurity - inability: Not on file  . Transportation needs - medical: Not on file  . Transportation needs - non-medical: Not on file  Occupational  History  . Not on file  Tobacco Use  . Smoking status: Never Smoker  . Smokeless tobacco: Never Used  Substance and Sexual Activity  . Alcohol use: No    Alcohol/week: 0.0 oz    Frequency: Never    Comment: 3-4 times a week  . Drug use: No    Comment: past use  . Sexual activity: Yes    Partners: Male    Birth control/protection: None  Other Topics Concern  . Not on file  Social History Narrative  . Not on file   No current facility-administered medications on file prior to encounter.    Current Outpatient Medications on File Prior to Encounter  Medication Sig Dispense Refill  . acetaminophen (TYLENOL) 325 MG tablet Take 650 mg by mouth every 6 (six) hours as needed for moderate pain or headache.    . calcium carbonate (TUMS - DOSED IN MG ELEMENTAL CALCIUM) 500 MG chewable tablet Chew 2 tablets by mouth 3 (three) times daily as needed for indigestion or heartburn.    . Multiple Vitamins-Minerals (EMERGEN-C IMMUNE) PACK Take 1 packet by mouth daily as needed (For cold symptoms.).    [name] Prenatal Vit-Fe Fumarate-FA (PRENATAL MULTIVITAMIN) TABS tablet Take 1 tablet by mouth daily at 12 noon.      No Known Allergies  ROS:  Review of Systems  Constitutional: Negative.   Eyes: Negative.   Respiratory: Negative.   Cardiovascular: Negative.   Gastrointestinal: Negative.   Genitourinary: Negative.  Musculoskeletal: Negative.   Neurological: Positive for headaches.  Psychiatric/Behavioral: Negative.    I have reviewed patient's Past Medical Hx, Surgical Hx, Family Hx, Social Hx, medications and allergies.   Physical Exam   Patient Vitals for the past 24 hrs:  BP Pulse Resp SpO2  10/15/17 1126 135/84 72 - -  10/15/17 1115 135/84 72 - -  10/15/17 1101 (!) 128/91 65 - -  10/15/17 1046 (!) 141/97 69 - -  10/15/17 1030 (!) 148/93 78 - -  10/15/17 1020 (!) 143/95 75 - -  10/15/17 1010 (!) 165/106 74 17 99 %   Constitutional: Well-developed, well-nourished female in no acute  distress.  Cardiovascular: normal rate Respiratory: normal effort GI: Abd soft, non-tender. Pos BS x 4 MS: Extremities nontender, no edema, normal ROM, DTR +1  Neurologic: Alert and oriented x 4.  GU: Neg CVAT.  LAB RESULTS Results for orders placed or performed during the hospital encounter of 10/15/17 (from the past 24 hour(s))  Protein / creatinine ratio, urine     Status: None   Collection Time: 10/15/17 10:16 AM  Result Value Ref Range   Creatinine, Urine 35.00 mg/dL   Total Protein, Urine <6 mg/dL   Protein Creatinine Ratio        0.00 - 0.15 mg/mg[Cre]  CBC     Status: None   Collection Time: 10/15/17 10:18 AM  Result Value Ref Range   WBC 9.6 4.0 - 10.5 K/uL   RBC 5.02 3.87 - 5.11 MIL/uL   Hemoglobin 13.4 12.0 - 15.0 g/dL   HCT 95.641.5 21.336.0 - 08.646.0 %   MCV 82.7 78.0 - 100.0 fL   MCH 26.7 26.0 - 34.0 pg   MCHC 32.3 30.0 - 36.0 g/dL   RDW 57.815.5 46.911.5 - 62.915.5 %   Platelets 330 150 - 400 K/uL  Comprehensive metabolic panel     Status: Abnormal   Collection Time: 10/15/17 10:18 AM  Result Value Ref Range   Sodium 134 (L) 135 - 145 mmol/L   Potassium 4.0 3.5 - 5.1 mmol/L   Chloride 103 101 - 111 mmol/L   CO2 20 (L) 22 - 32 mmol/L   Glucose, Bld 98 65 - 99 mg/dL   BUN 12 6 - 20 mg/dL   Creatinine, Ser 5.280.72 0.44 - 1.00 mg/dL   Calcium 8.7 (L) 8.9 - 10.3 mg/dL   Total Protein 7.7 6.5 - 8.1 g/dL   Albumin 3.6 3.5 - 5.0 g/dL   AST 21 15 - 41 U/L   ALT 34 14 - 54 U/L   Alkaline Phosphatase 90 38 - 126 U/L   Total Bilirubin 0.5 0.3 - 1.2 mg/dL   GFR calc non Af Amer >60 >60 mL/min   GFR calc Af Amer >60 >60 mL/min   Anion gap 11 5 - 15    --/--/A POS (12/22 0210)  MAU Management/MDM: Orders Placed This Encounter  Procedures  . Protein / creatinine ratio, urine  . CBC  . Comprehensive metabolic panel    Meds ordered this encounter  Medications  . acetaminophen (TYLENOL) tablet 1,000 mg  . labetalol (NORMODYNE) tablet 200 mg  . acetaminophen (TYLENOL) 325 MG tablet     Sig: Take 3 tablets (975 mg total) by mouth every 6 (six) hours as needed for moderate pain or headache.    Order Specific Question:   Supervising Provider    Answer:   Willodean RosenthalHARRAWAY-SMITH, CAROLYN [[id number]]  . labetalol (NORMODYNE) 100 MG tablet    Sig: Take 2  tablets (200 mg total) by mouth 2 (two) times daily.    Dispense:  60 tablet    Refill:  2    Order Specific Question:   Supervising Provider    Answer:   Willodean RosenthalHARRAWAY-SMITH, CAROLYN [[id number]]    Consult Dr Dareen PianoAnderson with management and treatment.  Treatments in MAU included 1,000mg  of Tylenol for HA- relief of HA with medication and Labetalol 200mg . Pt discharged with strict preeclampsia precautions.  ASSESSMENT 1. Hypertension, postpartum condition or complication   2. Acute non intractable tension-type headache   3. Hypertension in pregnancy, preeclampsia, delivered     PLAN Discharge home Rx for Labetalol 200mg  BID for hypertension Follow up as scheduled, continue Labetalol until PP appointment  Return to MAU as needed for increased BP in severe range or symptoms of preeclampsia.    Allergies as of 10/15/2017   No Known Allergies     Medication List    TAKE these medications   acetaminophen 325 MG tablet Commonly known as:  TYLENOL Take 3 tablets (975 mg total) by mouth every 6 (six) hours as needed for moderate pain or headache. What changed:  how much to take   calcium carbonate 500 MG chewable tablet Commonly known as:  TUMS - dosed in mg elemental calcium Chew 2 tablets by mouth 3 (three) times daily as needed for indigestion or heartburn.   EMERGEN-C IMMUNE Pack Take 1 packet by mouth daily as needed (For cold symptoms.).   ibuprofen 200 MG tablet Commonly known as:  ADVIL,MOTRIN Take 600 mg by mouth every 6 (six) hours as needed for headache or moderate pain.   labetalol 100 MG tablet Commonly known as:  NORMODYNE Take 2 tablets (200 mg total) by mouth 2 (two) times daily.   prenatal multivitamin Tabs  tablet Take 1 tablet by mouth daily at 12 noon.       Steward DroneVeronica Tashauna Caisse  Certified Nurse-Midwife 10/15/2017  10:46 AM

## 2017-10-15 NOTE — Progress Notes (Addendum)
PP pt that delivered on 22 Dec. MD called to unit regards to pt coming in for BP issue. Pt states has not felt well for several days and has a ha. Medications both tylenol and motrin taken but pt states did not help.   Hx of previous HTN with past pregnancies. Pt states was on bp medications during PP period with subsequent pp period.    1029: lab at bs   1054: Tylenol 1000mg  PO given.   1107: Dr. Dareen PianoAnderson called to unit. Charge nurse received order to admin  200mg  Labetalol PO. Order placed in CC for MD.   1128: Labetalol given  1204: d/c instructions given with pt understanding. Pt left unit via ambulatory with SO and baby

## 2017-10-15 NOTE — MAU Note (Signed)
Dr. Dareen PianoAnderson called for BP readings, received order for Labetalol 200 mg.

## 2017-10-15 NOTE — MAU Note (Signed)
Patient presents for elevated blood pressure, 163/110, headache for several days.

## 2018-03-20 ENCOUNTER — Other Ambulatory Visit: Payer: Self-pay | Admitting: Internal Medicine

## 2018-03-20 DIAGNOSIS — R1011 Right upper quadrant pain: Secondary | ICD-10-CM

## 2018-03-23 ENCOUNTER — Ambulatory Visit
Admission: RE | Admit: 2018-03-23 | Discharge: 2018-03-23 | Disposition: A | Discharge status: Home / Self Care | Payer: Self-pay | Source: Ambulatory Visit | Attending: Internal Medicine | Admitting: Internal Medicine

## 2018-03-23 ENCOUNTER — Other Ambulatory Visit: Payer: Self-pay | Admitting: Internal Medicine

## 2018-03-23 DIAGNOSIS — G44209 Tension-type headache, unspecified, not intractable: Secondary | ICD-10-CM

## 2018-03-23 IMAGING — MR MR HEAD W/O CM
8 series · 48 of 48 positions shown · non-contrast
Comparison: Head CT 04/03/2008

CLINICAL DATA: Tension type headache, not intractable

EXAM:
MRI HEAD WITHOUT CONTRAST
TECHNIQUE: Multiplanar, multiecho pulse sequences of the brain and surrounding
structures were obtained without intravenous contrast.

[Series 2: T1 · sagittal · 5.0mm · 0.45mm/px · 3 of 21 slices shown]
[im 1/21]
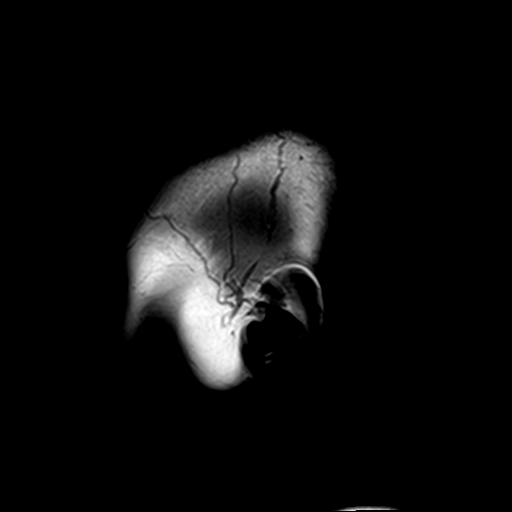
[im 11/21]
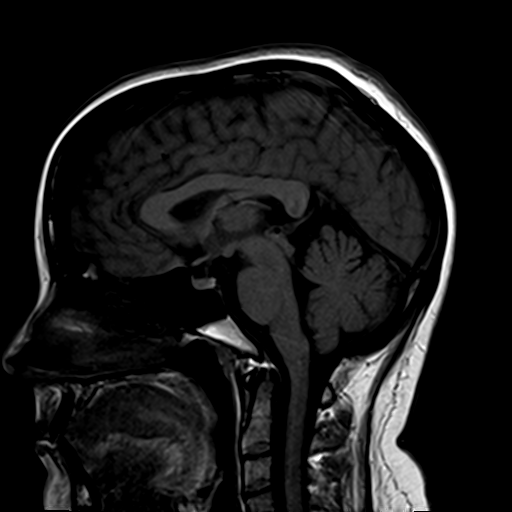
[im 21/21]
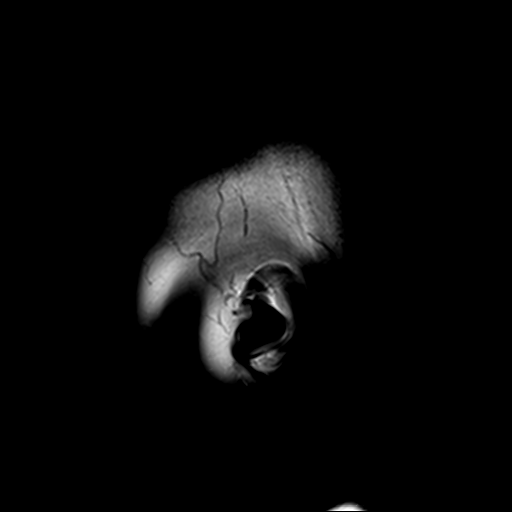

[Series 3: DWI · axial · 3.0mm · 1.80mm/px · z∈[-72,+75]mm · 12 of 100 slices shown (1 of 2)]
[im 1/100]
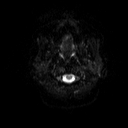
[im 10/100]
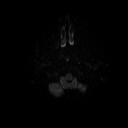
[im 19/100]
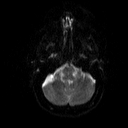
[im 28/100]
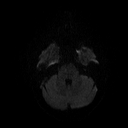
[im 37/100]
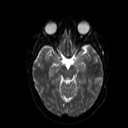
[im 46/100]
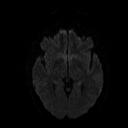
[im 55/100]
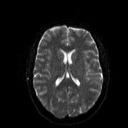
[im 64/100]
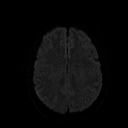
[im 73/100]
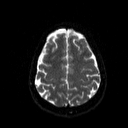
[im 82/100]
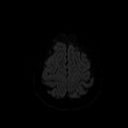
[im 91/100]
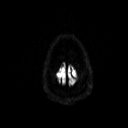
[im 100/100]
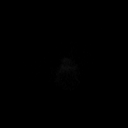

[Series 4: DWI · axial · 3.0mm · 1.80mm/px · z∈[-72,+75]mm · 5 of 48 slices shown (2 of 2)]
[im 1/48]
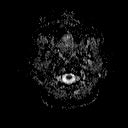
[im 12/48]
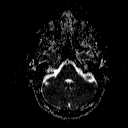
[im 24/48]
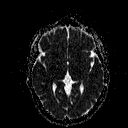
[im 36/48]
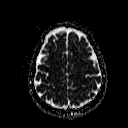
[im 48/48]
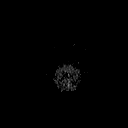

[Series 5: T2 · axial · 5.0mm · 0.51mm/px · z∈[-68,+79]mm · 2 of 22 slices shown (1 of 2)]
[im 1/22]
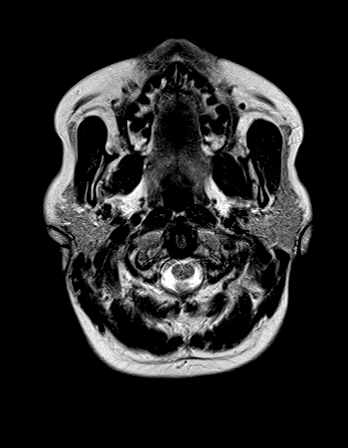
[im 22/22]
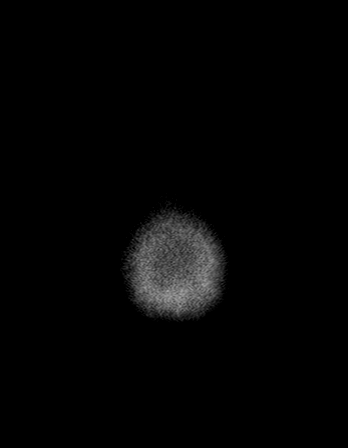

[Series 6: FLAIR · axial · 3.0mm · 0.45mm/px · z∈[-72,+71]mm · 4 of 32 slices shown]
[im 1/32]
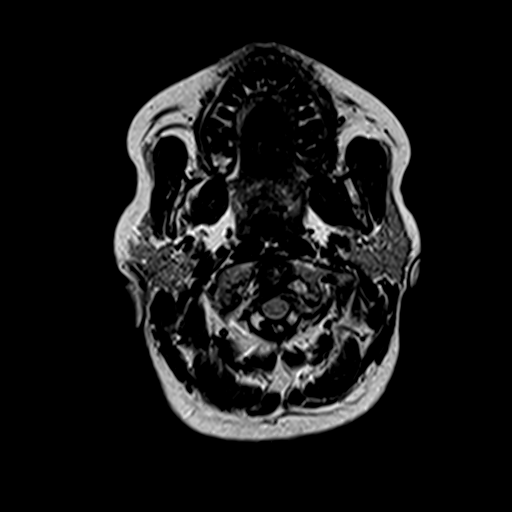
[im 11/32]
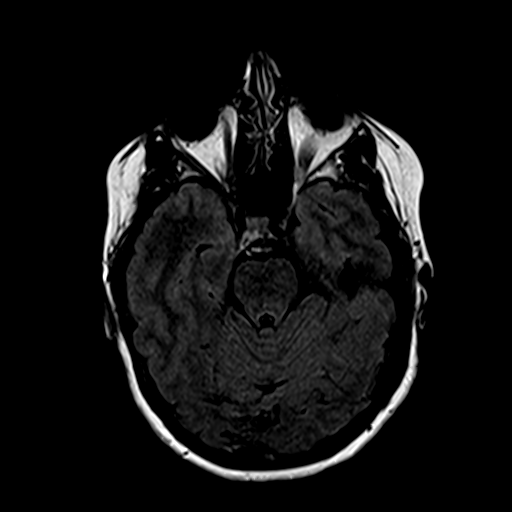
[im 21/32]
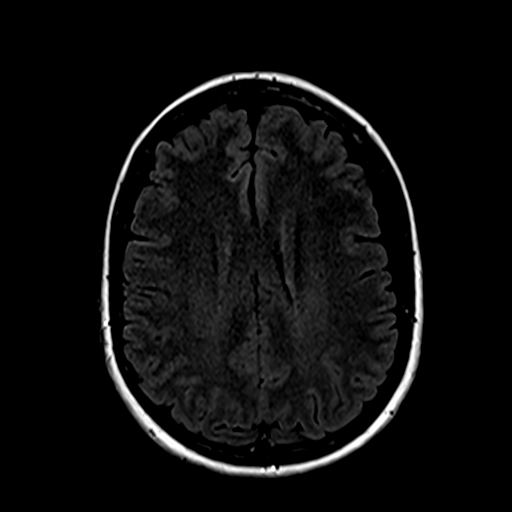
[im 32/32]
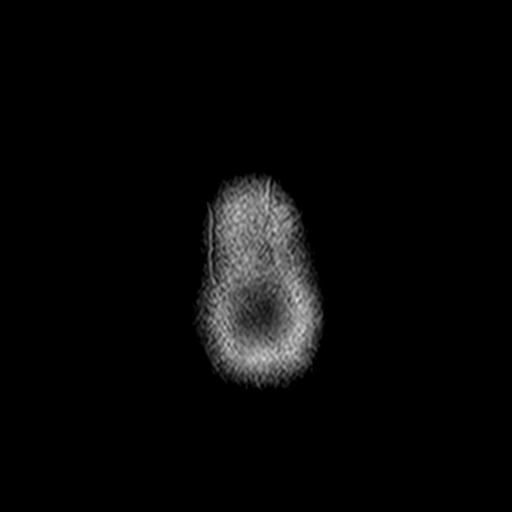

[Series 8: swi_images · axial · 5.0mm · 0.90mm/px · z∈[-73,+71]mm · 3 of 30 slices shown]
[im 1/30]
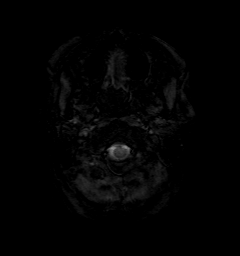
[im 15/30]
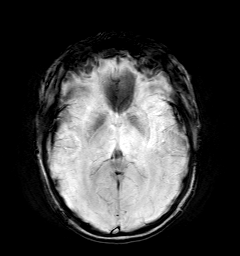
[im 30/30]
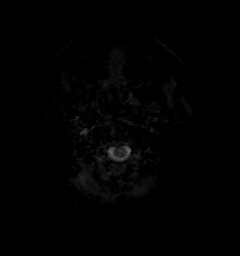

[Series 9: t1_mpr_tra · axial · 1.0mm · 0.71mm/px · z∈[-72,+70]mm · 16 of 144 slices shown]
[im 1/144]
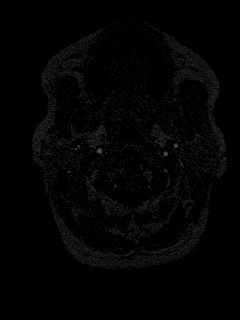
[im 10/144]
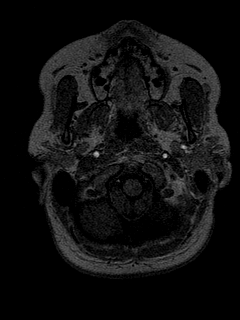
[im 20/144]
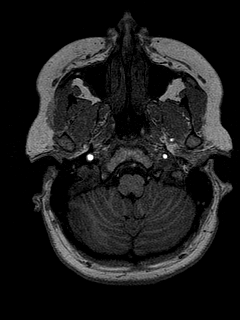
[im 29/144]
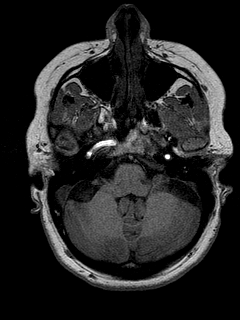
[im 39/144]
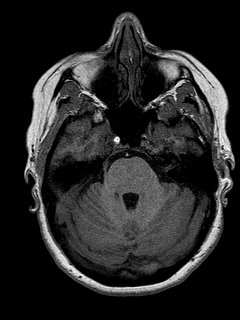
[im 48/144]
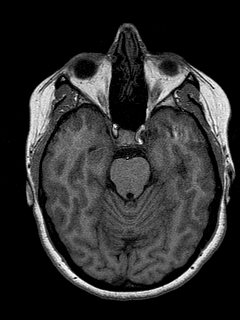
[im 58/144]
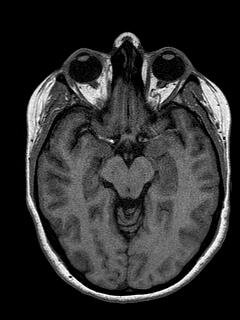
[im 67/144]
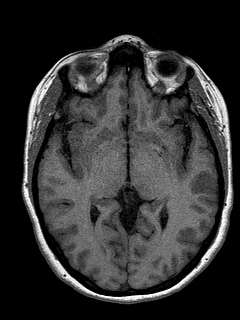
[im 77/144]
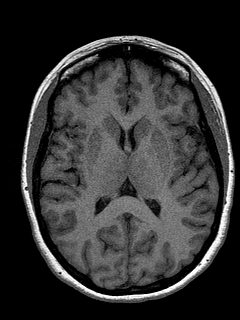
[im 86/144]
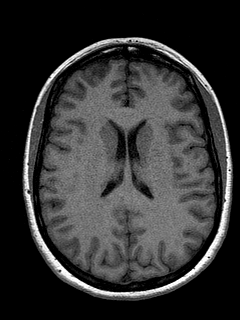
[im 96/144]
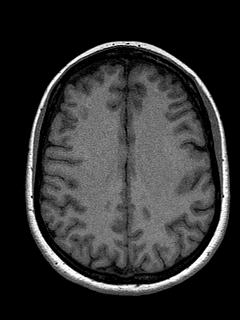
[im 105/144]
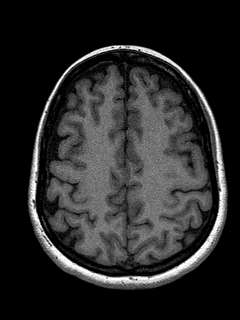
[im 115/144]
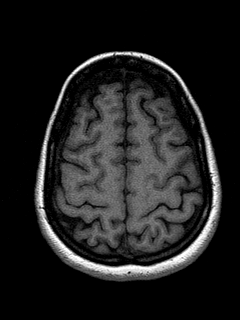
[im 124/144]
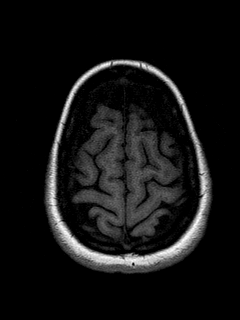
[im 134/144]
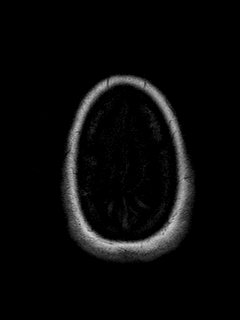
[im 144/144]
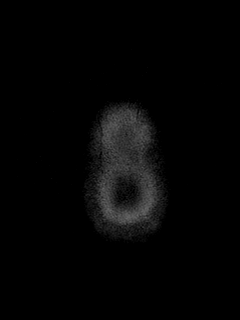

[Series 10: T2 · coronal · 5.0mm · 0.45mm/px · 3 of 29 slices shown (2 of 2)]
[im 1/29]
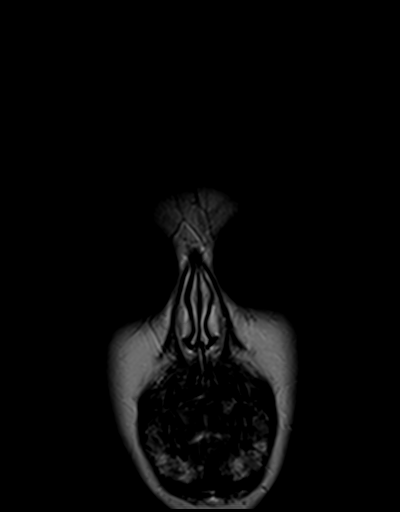
[im 15/29]
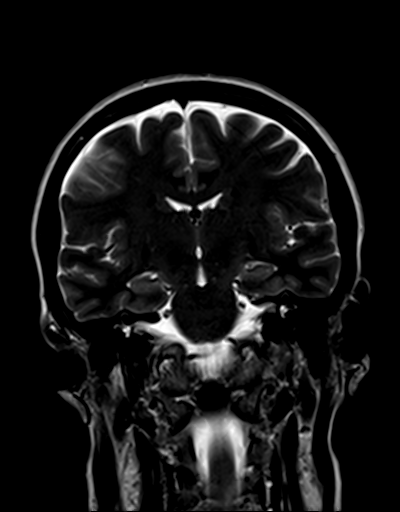
[im 29/29]
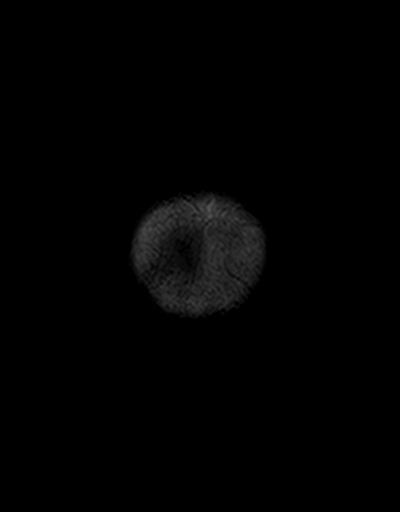

[48 of 48 positions shown; findings below may reference images not displayed]

FINDINGS: Brain: No infarction, hemorrhage, hydrocephalus, extra-axial
collection or mass lesion.

Vascular: Normal flow voids.

Skull and upper cervical spine: Normal marrow signal.

Sinuses/Orbits: Negative.
IMPRESSION: Normal exam.  No explanation for headache.

## 2018-03-26 ENCOUNTER — Other Ambulatory Visit: Payer: Self-pay | Admitting: Internal Medicine

## 2018-03-26 DIAGNOSIS — R51 Headache: Principal | ICD-10-CM

## 2018-03-26 DIAGNOSIS — R519 Headache, unspecified: Secondary | ICD-10-CM

## 2018-03-27 ENCOUNTER — Ambulatory Visit
Admission: RE | Admit: 2018-03-27 | Discharge: 2018-03-27 | Disposition: A | Discharge status: Home / Self Care | Payer: Managed Care, Other (non HMO) | Source: Ambulatory Visit | Attending: Internal Medicine | Admitting: Internal Medicine

## 2018-03-27 DIAGNOSIS — R1011 Right upper quadrant pain: Secondary | ICD-10-CM

## 2018-03-27 IMAGING — US US ABDOMEN LIMITED
1 series · 14 of 25 positions shown · non-contrast
Comparison: None.

CLINICAL DATA: Right upper quadrant pain for 5-6 months.

EXAM:
ULTRASOUND ABDOMEN LIMITED RIGHT UPPER QUADRANT

[Series 1: us abdomen limited · 0.22mm/px · 14 of 42 slices shown]
[im 1/42]
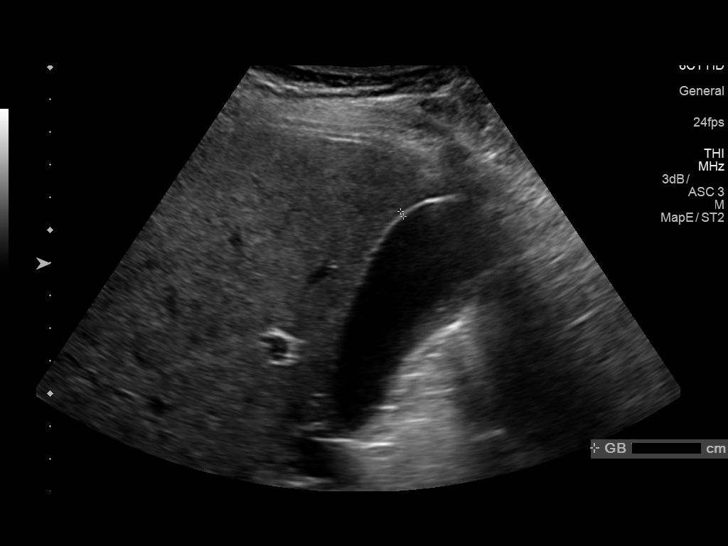
[im 4/42]
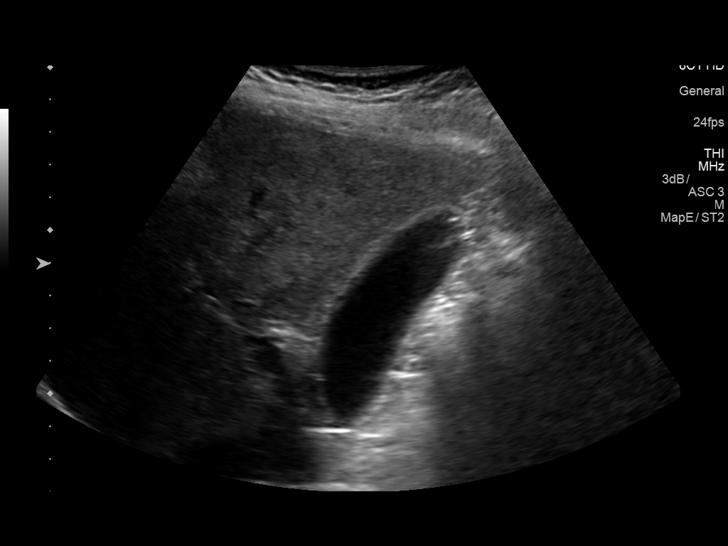
[im 7/42]
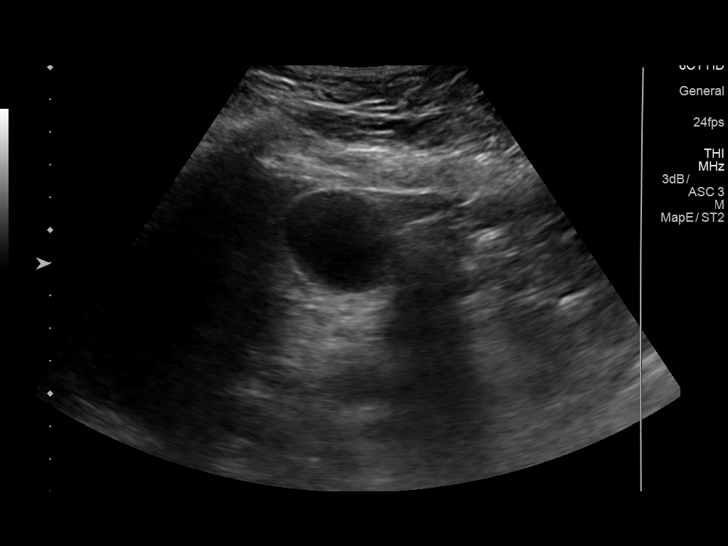
[im 11/42]
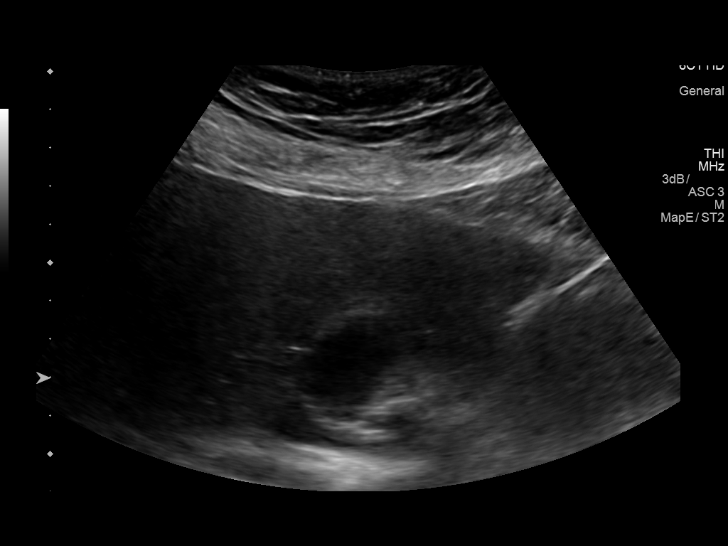
[im 14/42]
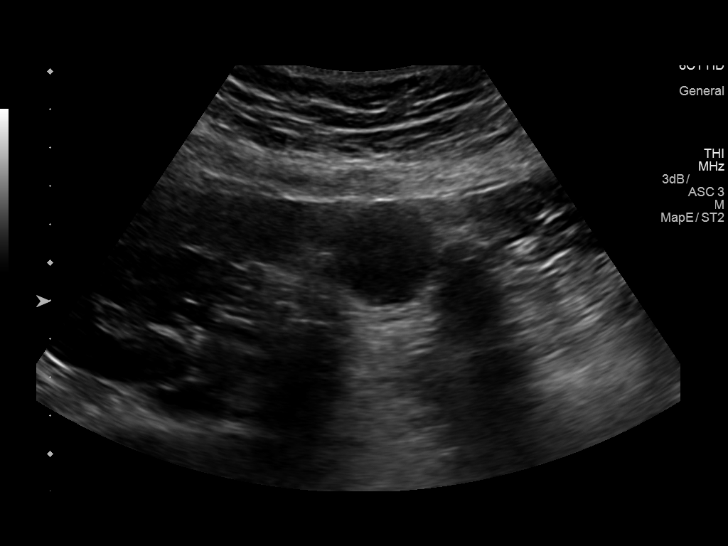
[im 16/42]
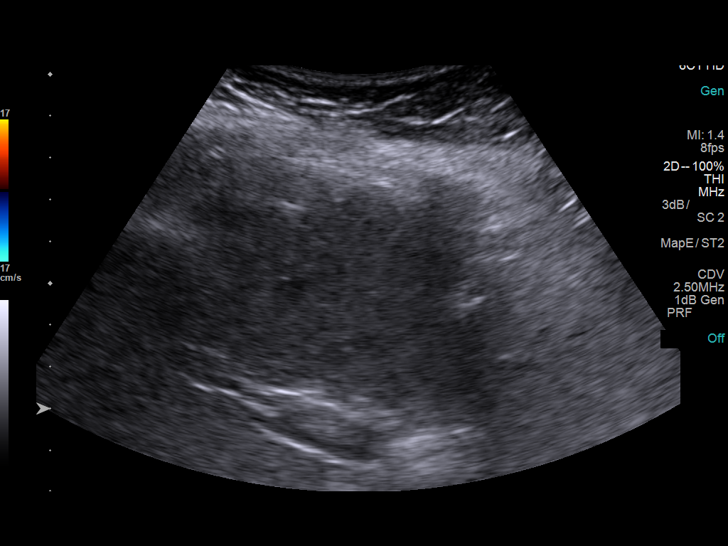
[im 19/42]
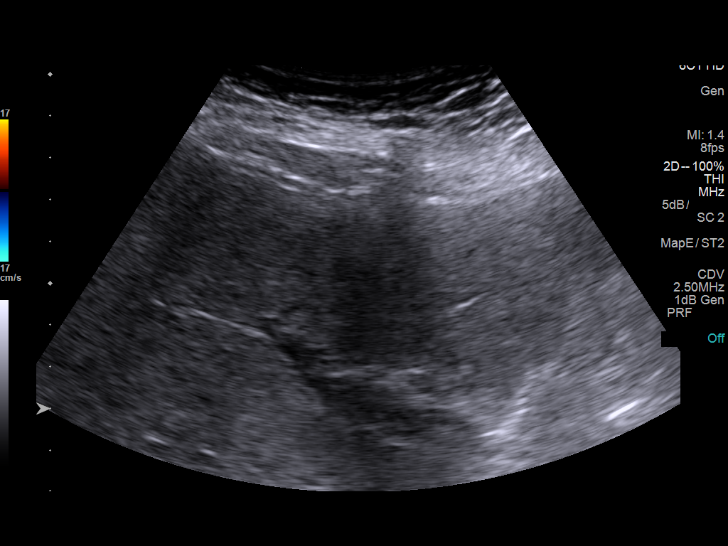
[im 23/42]
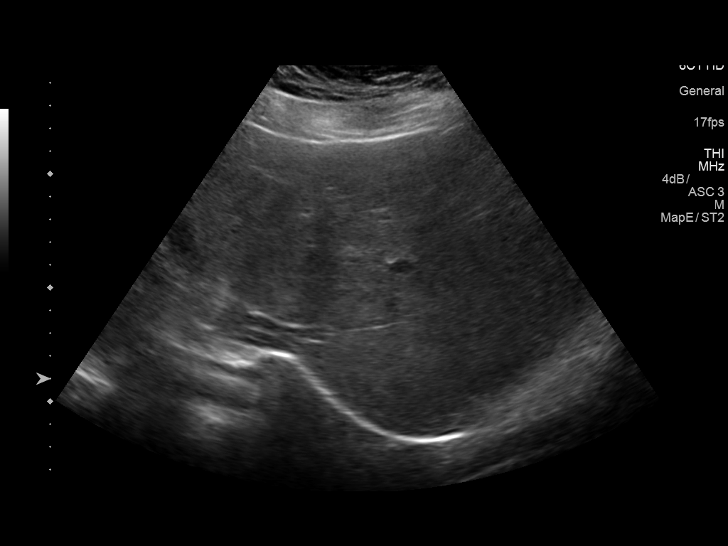
[im 26/42]
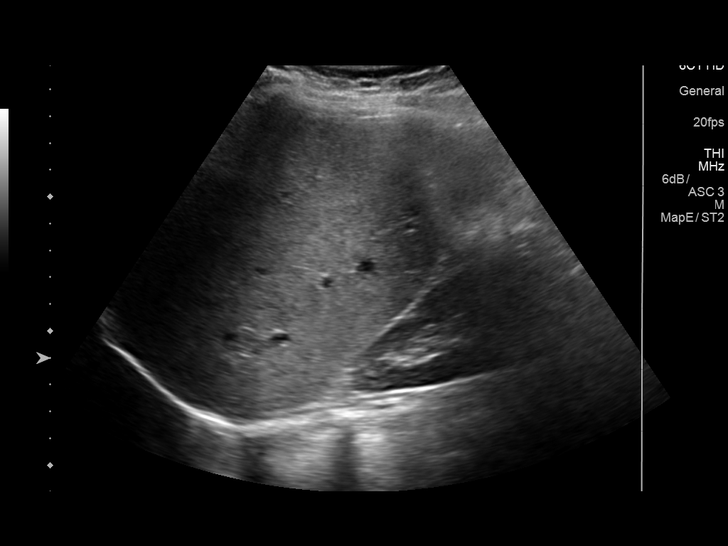
[im 28/42]
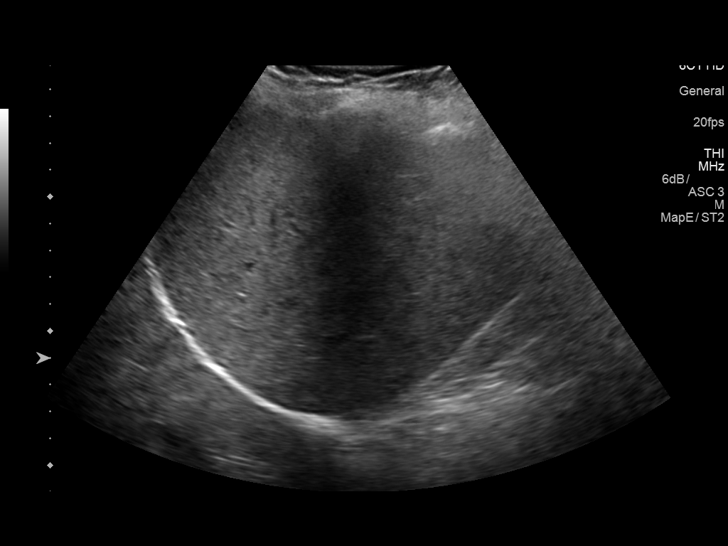
[im 31/42]
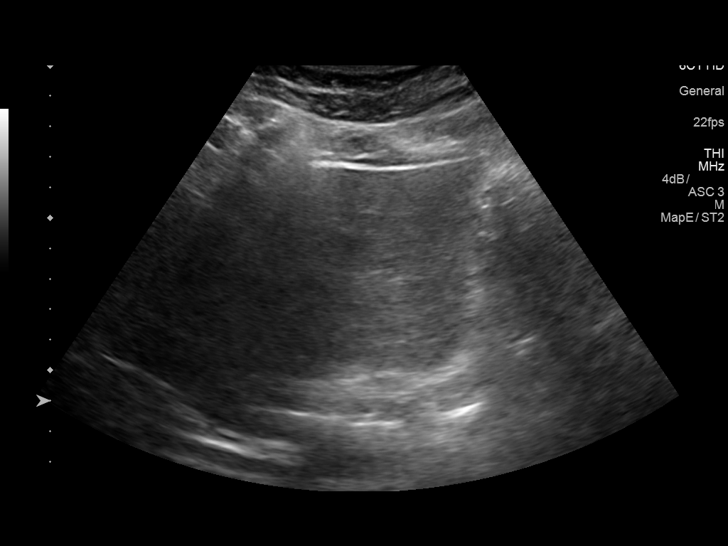
[im 35/42]
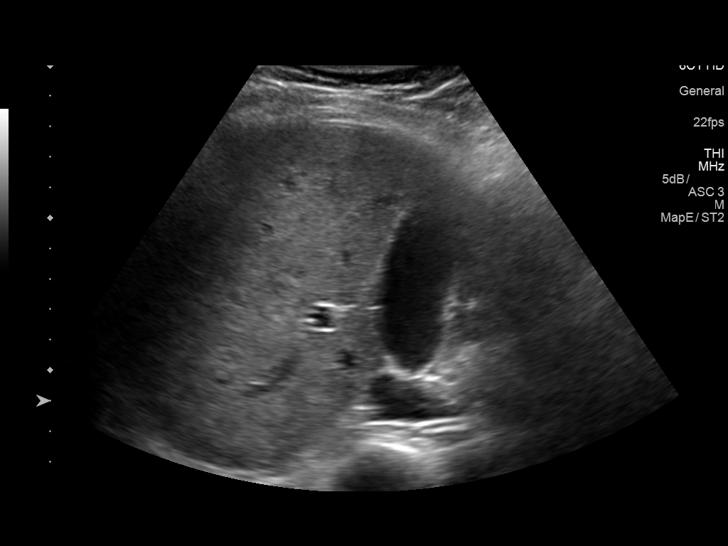
[im 38/42]
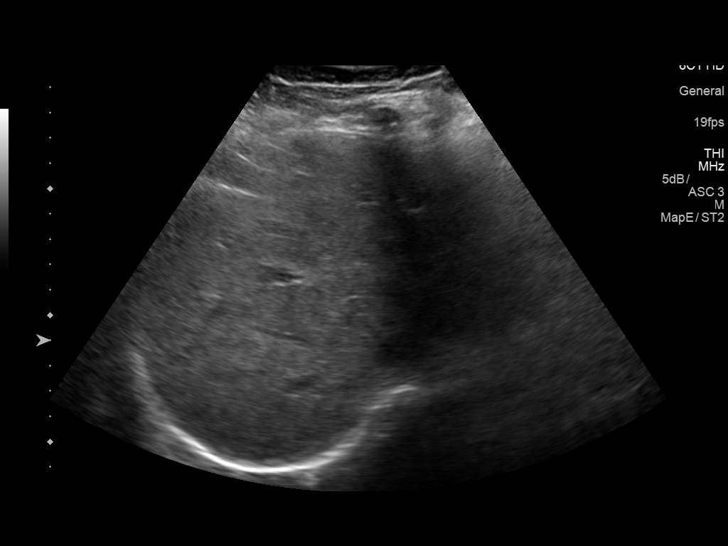
[im 42/42]
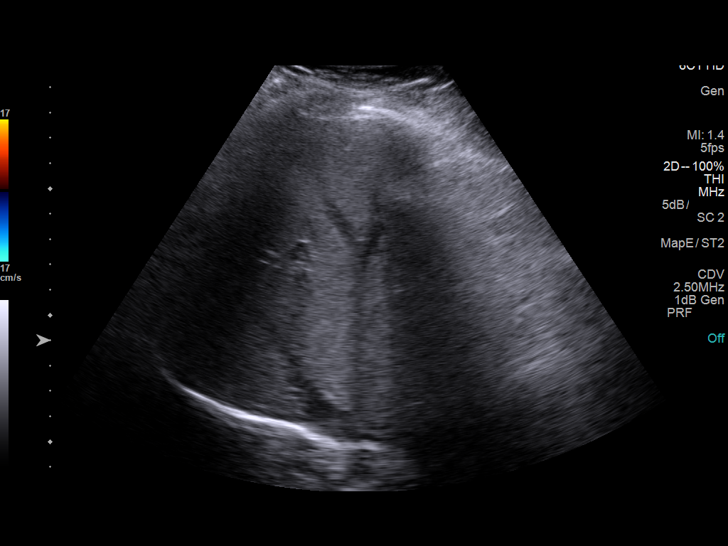

[14 of 25 positions shown; findings below may reference images not displayed]

FINDINGS: Gallbladder:

No gallstones or wall thickening visualized. No sonographic Murphy
sign noted by sonographer.

Common bile duct:

Diameter: 0.2 cm

Liver:

No focal lesion identified. Within normal limits in parenchymal
echogenicity. Portal vein is patent on color Doppler imaging with
normal direction of blood flow towards the liver.
IMPRESSION: Negative for gallstones.  Normal exam.

## 2018-03-28 ENCOUNTER — Other Ambulatory Visit: Payer: Self-pay | Admitting: Internal Medicine

## 2018-03-28 DIAGNOSIS — G4452 New daily persistent headache (NDPH): Secondary | ICD-10-CM

## 2018-12-18 ENCOUNTER — Other Ambulatory Visit: Payer: Self-pay | Admitting: Otolaryngology

## 2018-12-18 DIAGNOSIS — K116 Mucocele of salivary gland: Secondary | ICD-10-CM

## 2018-12-28 ENCOUNTER — Other Ambulatory Visit: Payer: Self-pay

## 2019-01-01 ENCOUNTER — Other Ambulatory Visit: Payer: Self-pay

## 2019-01-07 ENCOUNTER — Encounter: Payer: Self-pay | Admitting: Radiology

## 2019-01-07 ENCOUNTER — Ambulatory Visit
Admission: RE | Admit: 2019-01-07 | Discharge: 2019-01-07 | Disposition: A | Discharge status: Home / Self Care | Payer: Managed Care, Other (non HMO) | Source: Ambulatory Visit | Attending: Otolaryngology | Admitting: Otolaryngology

## 2019-01-07 DIAGNOSIS — K116 Mucocele of salivary gland: Secondary | ICD-10-CM

## 2019-01-07 IMAGING — CT CT NECK WITH CONTRAST
3 of 8 series · 10 of 33 positions shown, 12 images · IV contrast (iopamidol)
Comparison: MRI 03/23/2018. Head CT 04/03/2009. Neck CT 12/07/2006.

CLINICAL DATA: Swelling in the region of the left parotid gland. No
visible preauricular pit.

EXAM:
CT NECK WITH CONTRAST
TECHNIQUE: Multidetector CT imaging of the neck was performed using the
standard protocol following the bolus administration of intravenous
contrast.
CONTRAST:  75mL AH3ESR-B88 IOPAMIDOL (AH3ESR-B88) INJECTION 61%

[Series 8: neck 2.00 br40 s3 (person_name) · coronal · 0.46mm/px · 3 of 113 slices shown]
[im 23/113  bone]
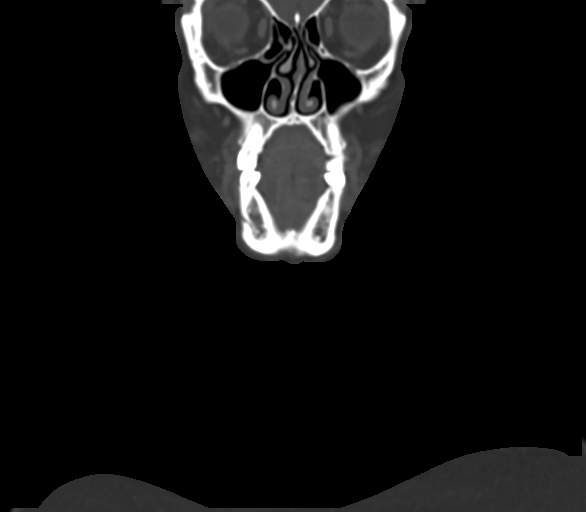
[im 45/113  bone]
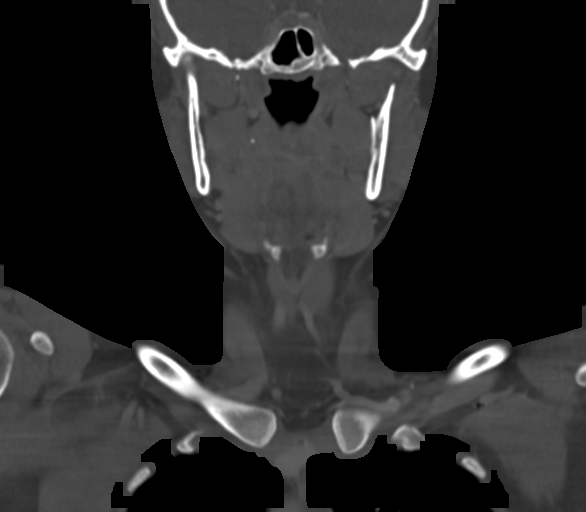
[im 68/113  bone]
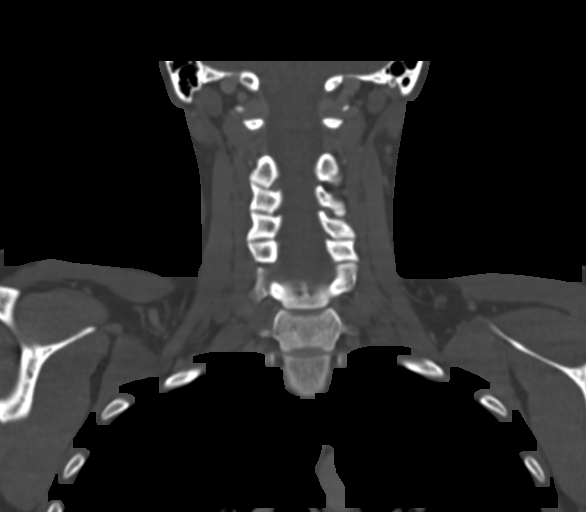

[Series 10: neck 2.00 br40 s3 sag st · sagittal · 0.39mm/px · 5 of 133 slices shown, 6 images]
[im 45/133  bone]
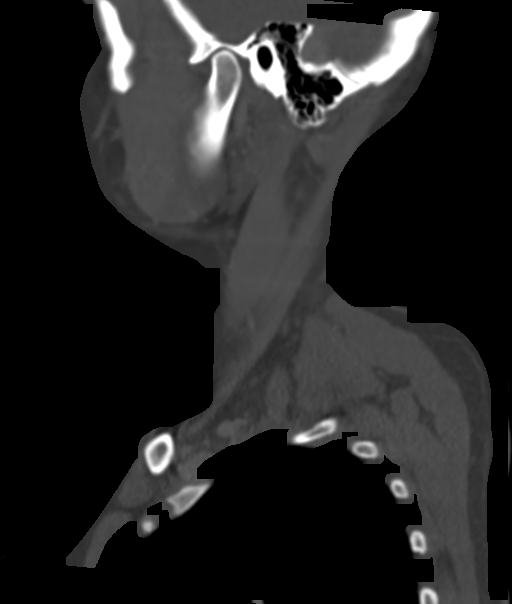
[im 56/133  bone]
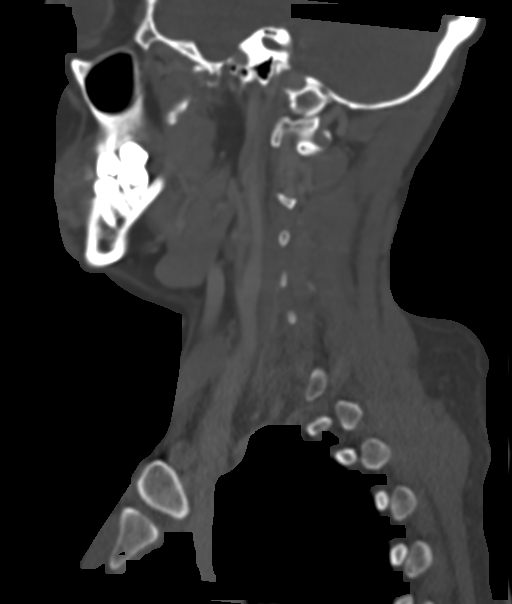
[im 67/133  soft-tissue]
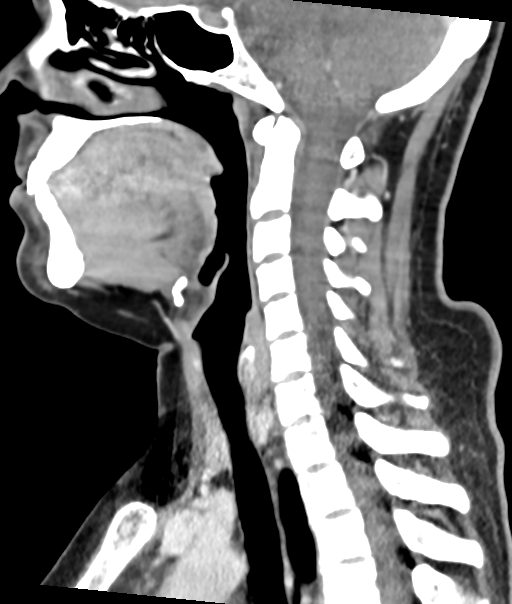
[im 67/133  bone]
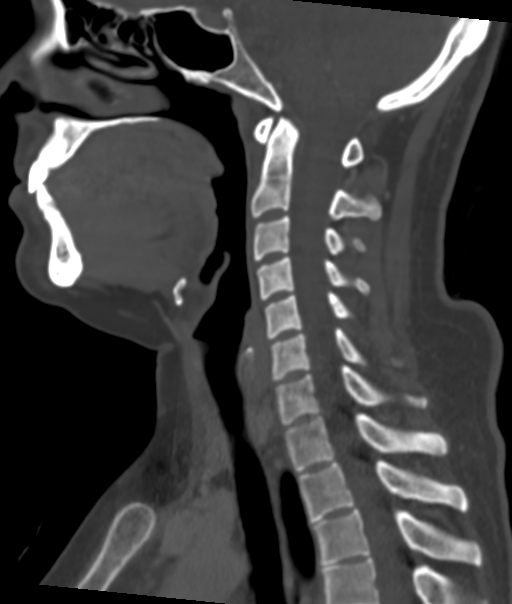
[im 78/133  bone]
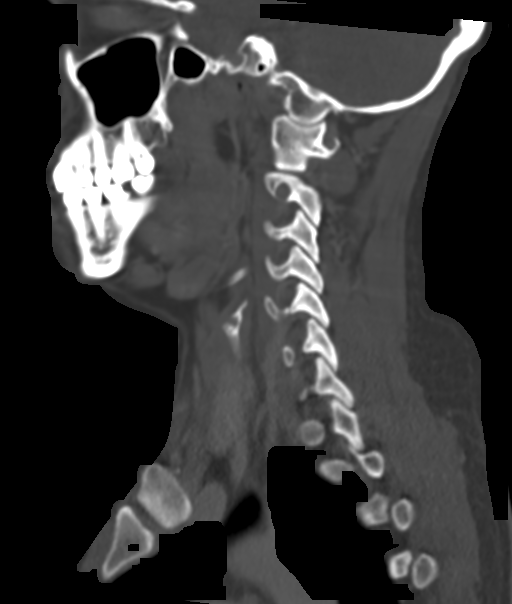
[im 89/133  bone]
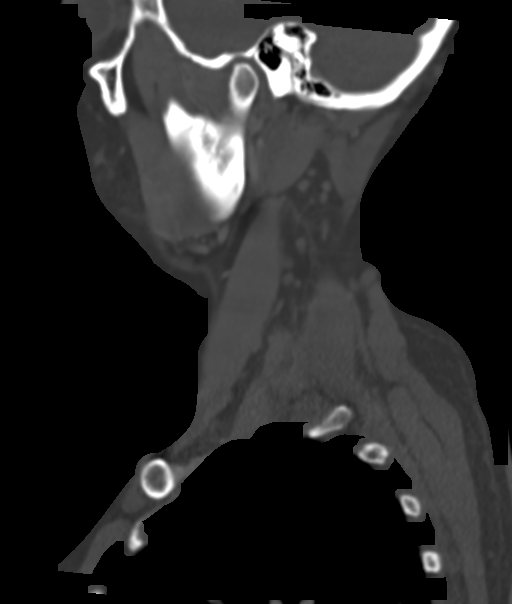

[Series 12: neck 2.00 br36 s3 (person_name) · axial · 0.44mm/px · z∈[-789,-714]mm · 2 of 232 slices shown, 3 images]
[im 78/232  soft-tissue]
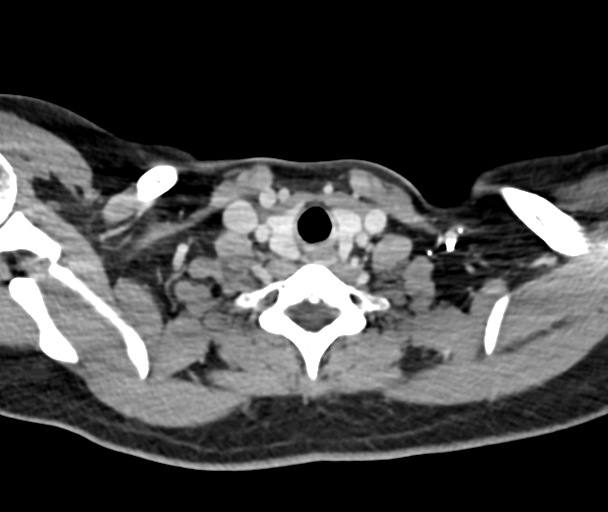
[im 78/232  bone]
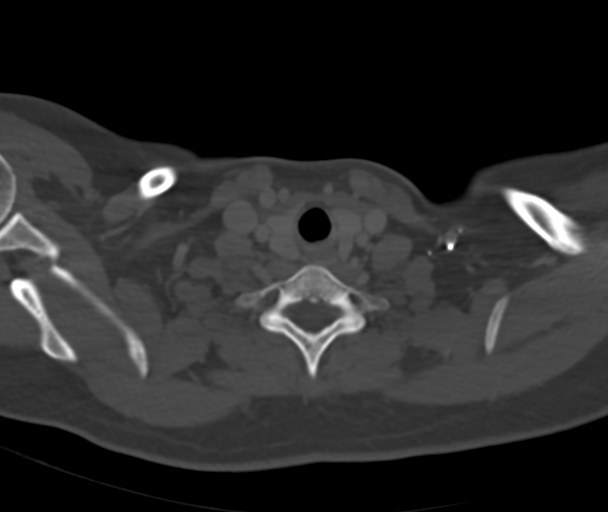
[im 155/232  bone]
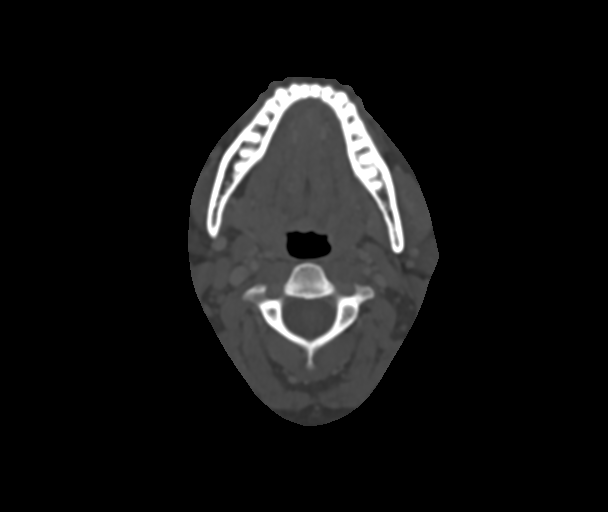

[10 of 33 positions shown; findings below may reference images not displayed]

FINDINGS: Pharynx and larynx: No mucosal or submucosal lesion.

Salivary glands: Parotid and submandibular glands are normal. Skin
marker on the left is in the preauricular region, cephalad to the
upper portion of the parotid gland. Specifically, there is no
evidence of parotid cyst or mass on this study or on any of the
previous studies.

Thyroid: Normal

Lymph nodes: No enlarged or low-density nodes on either side of the
neck. Normal bilateral nodes.

Vascular: No abnormal vascular finding.

Limited intracranial: Normal

Visualized orbits: Normal

Mastoids and visualized paranasal sinuses: Normal

Skeleton: No bone abnormality seen. Specifically, no abnormality of
the left temporal bone or external auditory canal.

Upper chest: Normal

Other: None
IMPRESSION: No abnormality seen to correlate with the clinical complaint. Skin
marker present on the left in the preauricular region, superior to
the parotid gland. There is no evidence of cyst or mass in this
region. Only normal appearing tissue is seen. We have multiple
previous imaging studies of this region going back as far as 4440,
and the appearance is unchanged.

## 2019-01-07 MED ORDER — IOPAMIDOL (ISOVUE-300) INJECTION 61%
75.0000 mL | Freq: Once | INTRAVENOUS | Status: AC | PRN
  Administered 2019-01-07: 75 mL via INTRAVENOUS

## 2020-04-30 ENCOUNTER — Other Ambulatory Visit (HOSPITAL_COMMUNITY): Payer: Self-pay | Admitting: Adult Health

## 2020-04-30 DIAGNOSIS — R0602 Shortness of breath: Secondary | ICD-10-CM

## 2020-07-07 ENCOUNTER — Ambulatory Visit (INDEPENDENT_AMBULATORY_CARE_PROVIDER_SITE_OTHER): Payer: Managed Care, Other (non HMO) | Admitting: Pulmonary Disease

## 2020-07-07 ENCOUNTER — Encounter: Payer: Self-pay | Admitting: Pulmonary Disease

## 2020-07-07 ENCOUNTER — Other Ambulatory Visit: Payer: Self-pay

## 2020-07-07 VITALS — BP 116/76 | HR 68 | Temp 97.4°F | Ht 66.0 in | Wt 177.6 lb

## 2020-07-07 DIAGNOSIS — R0600 Dyspnea, unspecified: Secondary | ICD-10-CM

## 2020-07-07 DIAGNOSIS — R0609 Other forms of dyspnea: Secondary | ICD-10-CM

## 2020-07-07 LAB — CBC WITH DIFFERENTIAL/PLATELET
Basophils Absolute: 0.1 10*3/uL (ref 0.0–0.1)
Basophils Relative: 1.3 % (ref 0.0–3.0)
Eosinophils Absolute: 0.1 10*3/uL (ref 0.0–0.7)
Eosinophils Relative: 1.5 % (ref 0.0–5.0)
HCT: 40.1 % (ref 36.0–46.0)
Hemoglobin: 13.3 g/dL (ref 12.0–15.0)
Lymphocytes Relative: 31.2 % (ref 12.0–46.0)
Lymphs Abs: 2.1 10*3/uL (ref 0.7–4.0)
MCHC: 33.2 g/dL (ref 30.0–36.0)
MCV: 88.6 fl (ref 78.0–100.0)
Monocytes Absolute: 0.5 10*3/uL (ref 0.1–1.0)
Monocytes Relative: 7.4 % (ref 3.0–12.0)
Neutro Abs: 3.9 10*3/uL (ref 1.4–7.7)
Neutrophils Relative %: 58.6 % (ref 43.0–77.0)
Platelets: 252 10*3/uL (ref 150.0–400.0)
RBC: 4.52 Mil/uL (ref 3.87–5.11)
RDW: 14.3 % (ref 11.5–15.5)
WBC: 6.7 10*3/uL (ref 4.0–10.5)

## 2020-07-07 NOTE — Patient Instructions (Addendum)
Protracted cough with mucus expectoration Failed multiple treatments  We will obtain a CT scan of the chest-about 3 negative chest x-rays Pulmonary function test hypersensitivity panel CBC with differential IgE levels  Follow-up in about 6 weeks

## 2020-07-07 NOTE — Progress Notes (Signed)
Gabrielle Zavala    [id number]    1985-12-31  Primary Care Physician:Perini, Loraine Leriche, MD  Referring Physician: Rodrigo Ran, MD [address] [place],  [place] [address]  Chief complaint:   Patient with Complaints of Chest Discomfort constantly bringing up mucus tightness in the upper chest of many months duration  HPI:  Patient has been treated with a course of antibiotics Because of steroids Inhalers, albuterol with no relief Symptoms started about 8 to 9 months ago with cough and congestion  Denies any nasal symptoms of congestion Brings up clear foamy secretions all the time Denies symptoms of postnasal drip  She is at about 3 x-rays in the course of the illness and they have all been negative Did not improve with inhalers as stated above  No recent changes in environment Stay-at-home mom She has a pet dog that she is on for 7 years  No recent travel  Dad asked NSIP  Remote smoking history over 6 years ago-socially Did try marijuana and trying to get off of antidepressants-no longer using this  No history of mold exposure-Home environment has been tested  She does feel some shortness of breath at rest but not with activity. Voice is a bit raSPIER THAN NORMAL  Outpatient Encounter Medications as of 07/07/2020  Medication Sig  . acetaminophen (TYLENOL) 325 MG tablet Take 3 tablets (975 mg total) by mouth every 6 (six) hours as needed for moderate pain or headache.  . albuterol (VENTOLIN HFA) 108 (90 Base) MCG/ACT inhaler Inhale 2 puffs into the lungs every 4 (four) hours as needed.  . ALPRAZolam (XANAX) 1 MG tablet Take 1 mg by mouth 3 (three) times daily as needed.  . cyclobenzaprine (FLEXERIL) 10 MG tablet Take 10 mg by mouth at bedtime as needed.  . hydroxychloroquine (PLAQUENIL) 200 MG tablet TAKE ONE TABLET BY MOUTH TWICE DAILY FOR FIVE DAYS with food  . Magnesium Hydroxide 2400 MG/10ML SUSP Take 1 tablet by mouth daily.  . Melatonin 10 MG TABS Take by  mouth.  . Multiple Vitamins-Minerals (EMERGEN-C IMMUNE) PACK Take 1 packet by mouth daily as needed (For cold symptoms.).  . [DISCONTINUED] calcium carbonate (TUMS - DOSED IN MG ELEMENTAL CALCIUM) 500 MG chewable tablet Chew 2 tablets by mouth 3 (three) times daily as needed for indigestion or heartburn.  . [DISCONTINUED] ibuprofen (ADVIL,MOTRIN) 200 MG tablet Take 600 mg by mouth every 6 (six) hours as needed for headache or moderate pain.  . [DISCONTINUED] labetalol (NORMODYNE) 100 MG tablet Take 2 tablets (200 mg total) by mouth 2 (two) times daily.  . [DISCONTINUED] Prenatal Vit-Fe Fumarate-FA (PRENATAL MULTIVITAMIN) TABS tablet Take 1 tablet by mouth daily at 12 noon.    No facility-administered encounter medications on file as of 07/07/2020.    Allergies as of 07/07/2020  . (No Known Allergies)    Past Medical History:  Diagnosis Date  . Anxiety   . 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 Hx of preeclampsia, prior pregnancy, currently pregnant   . 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 Pre-eclampsia   . Vaginal Pap smear, abnormal     Past Surgical History:  Procedure Laterality Date  . COLPOSCOPY  05/2011   lgsil  . NO PAST SURGERIES    . WISDOM TOOTH EXTRACTION      Family History  Problem Relation Age of Onset  . Liver  disease Mother   . Breast cancer Mother 55  . Diabetes Maternal Uncle     Social History   Socioeconomic History  . Marital status: Married    Spouse name: Not on file  . Number of children: Not on file  . Years of education: Not on file  . Highest education level: Not on file  Occupational History  . Not on file  Tobacco Use  . Smoking status: Never Smoker  . Smokeless tobacco: Never Used  Substance and Sexual Activity  . Alcohol use: No    Comment: 3-4 times a week  . Drug use: No    Comment: past use  . Sexual activity: Yes     Partners: Male    Birth control/protection: None  Other Topics Concern  . Not on file  Social History Narrative  . Not on file   Social Determinants of Health   Financial Resource Strain:   . Difficulty of Paying Living Expenses: Not on file  Food Insecurity:   . Worried About Programme researcher, broadcasting/film/video in the Last Year: Not on file  . Ran Out of Food in the Last Year: Not on file  Transportation Needs:   . Lack of Transportation (Medical): Not on file  . Lack of Transportation (Non-Medical): Not on file  Physical Activity:   . Days of Exercise per Week: Not on file  . Minutes of Exercise per Session: Not on file  Stress:   . Feeling of Stress : Not on file  Social Connections:   . Frequency of Communication with Friends and Family: Not on file  . Frequency of Social Gatherings with Friends and Family: Not on file  . Attends Religious Services: Not on file  . Active Member of Clubs or Organizations: Not on file  . Attends Banker Meetings: Not on file  . Marital Status: Not on file  Intimate Partner Violence:   . Fear of Current or Ex-Partner: Not on file  . Emotionally Abused: Not on file  . Physically Abused: Not on file  . Sexually Abused: Not on file    Review of Systems  Respiratory: Positive for cough and shortness of breath.     Vitals:   07/07/20 1020  BP: 116/76  Pulse: 68  Temp: (!) 97.4 F (36.3 C)  SpO2: 100%     Physical Exam Constitutional:      Appearance: Normal appearance.  HENT:     Head: Normocephalic.     Mouth/Throat:     Mouth: Mucous membranes are moist.  Cardiovascular:     Rate and Rhythm: Normal rate and regular rhythm.     Pulses: Normal pulses.     Heart sounds: Normal heart sounds. No murmur heard.  No friction rub.  Pulmonary:     Effort: Pulmonary effort is normal. No respiratory distress.     Breath sounds: Normal breath sounds. No stridor. No wheezing or rhonchi.  Musculoskeletal:     Cervical back: No rigidity  or tenderness.  Neurological:     General: No focal deficit present.     Mental Status: She is alert.  Psychiatric:        Mood and Affect: Mood normal.   Data Reviewed: Records from primary's office reviewed  Assessment:  Cough  Shortness of breath  Persistent mucus production  Plan/Recommendations: She has had 3 - chest x-rays- We will obtain a CT scan of the chest  Obtain a pulmonary function test  Obtain hypersensitivity panels  to see if this is contributing to symptoms  Obtain CBC and IgE levels  Will not initiate any inhalers at present as it did not help in the past  Follow-up in about 6 weeks   Virl Diamond MD [facility] 07/07/2020, 10:48 AM  CC: Rodrigo Ran, MD

## 2020-07-08 LAB — IGE: IgE (Immunoglobulin E), Serum: 17 kU/L (ref <=114)

## 2020-07-10 LAB — HYPERSENSITIVITY PNEUMONITIS
A. Pullulans Abs: NEGATIVE
A.Fumigatus #1 Abs: NEGATIVE
Micropolyspora faeni, IgG: NEGATIVE
Pigeon Serum Abs: NEGATIVE
Thermoact. Saccharii: NEGATIVE
Thermoactinomyces vulgaris, IgG: NEGATIVE

## 2020-07-22 ENCOUNTER — Ambulatory Visit
Admission: RE | Admit: 2020-07-22 | Discharge: 2020-07-22 | Disposition: A | Discharge status: Home / Self Care | Payer: Managed Care, Other (non HMO) | Source: Ambulatory Visit | Attending: Pulmonary Disease | Admitting: Pulmonary Disease

## 2020-07-22 ENCOUNTER — Other Ambulatory Visit: Payer: Self-pay

## 2020-07-22 DIAGNOSIS — R0609 Other forms of dyspnea: Secondary | ICD-10-CM

## 2020-07-22 DIAGNOSIS — R0600 Dyspnea, unspecified: Secondary | ICD-10-CM | POA: Diagnosis present

## 2020-07-22 IMAGING — CT CT CHEST W/O CM
2 of 4 series · 15 of 36 positions shown, 18 images · non-contrast
Comparison: 07/27/2007

CLINICAL DATA: Dyspnea, chronic cough

EXAM:
CT CHEST WITHOUT CONTRAST
TECHNIQUE: Multidetector CT imaging of the chest was performed following the
standard protocol without IV contrast.

[Series 2: chest 2.00 · axial · 0.67mm/px · z∈[-1162,-892]mm · 12 of 161 slices shown, 15 images]
[im 13/161  mediastinal]
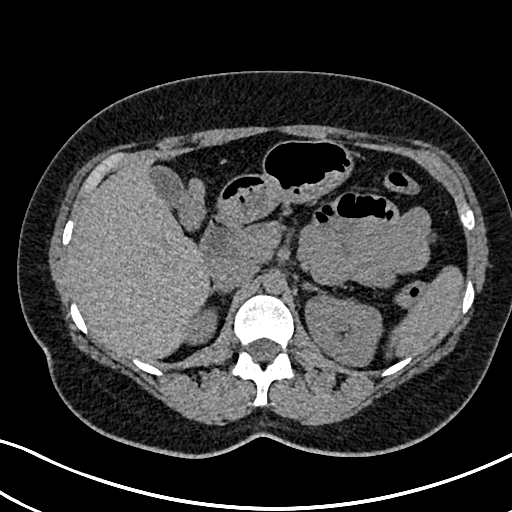
[im 13/161  lung]
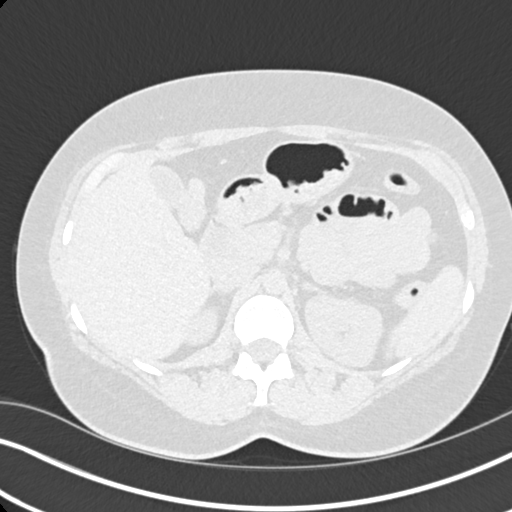
[im 25/161  lung]
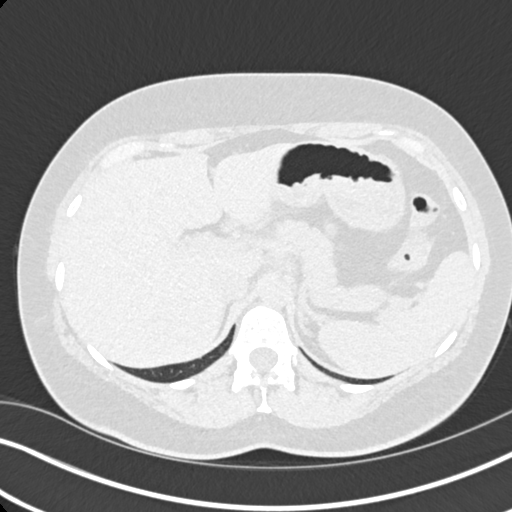
[im 37/161  lung]
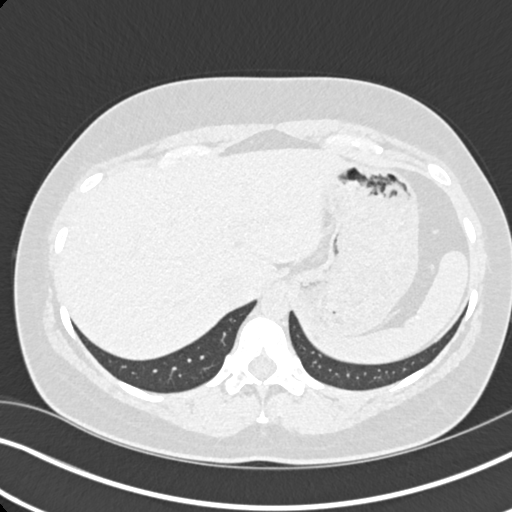
[im 50/161  lung]
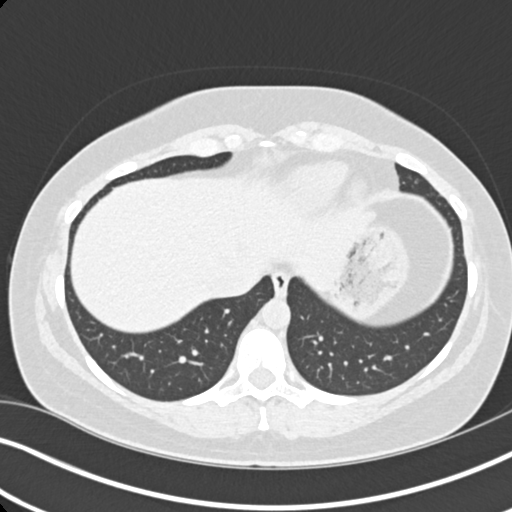
[im 62/161  mediastinal]
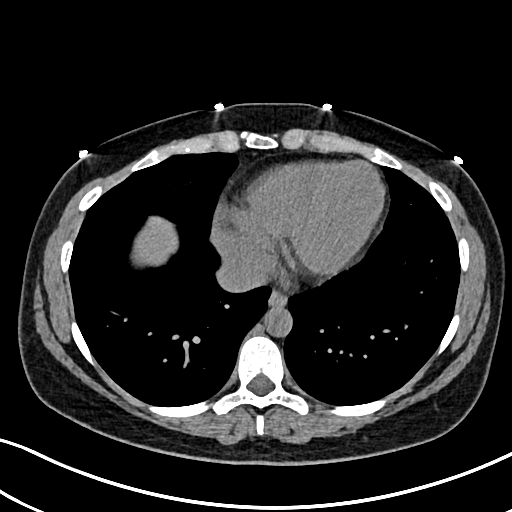
[im 62/161  lung]
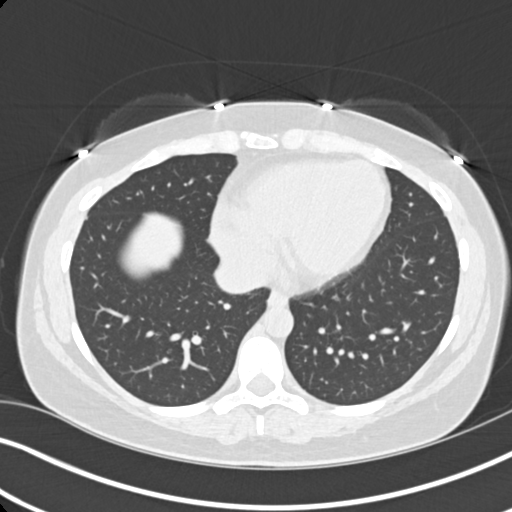
[im 74/161  lung]
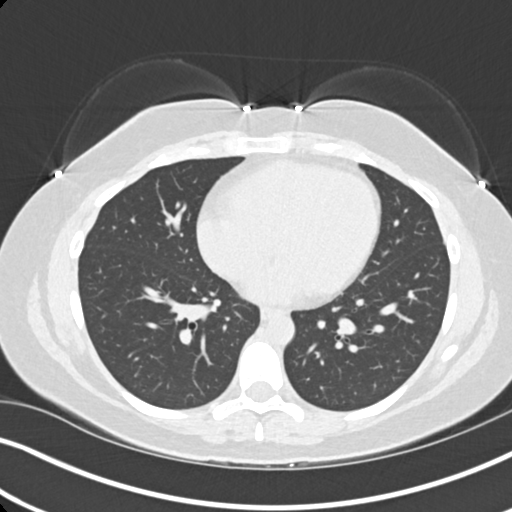
[im 87/161  lung]
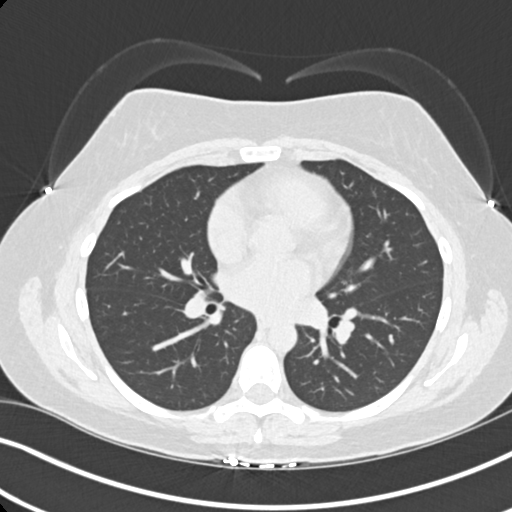
[im 99/161  lung]
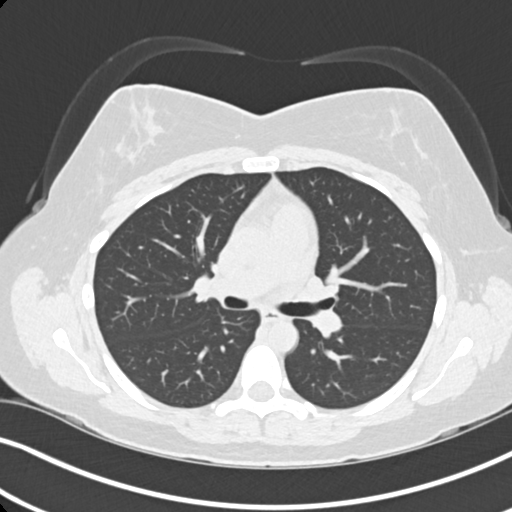
[im 111/161  mediastinal]
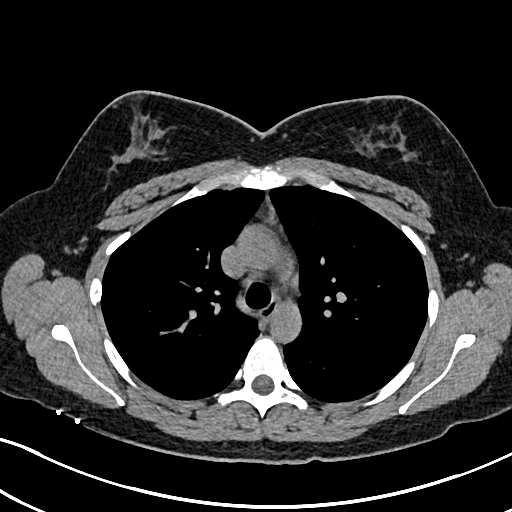
[im 111/161  lung]
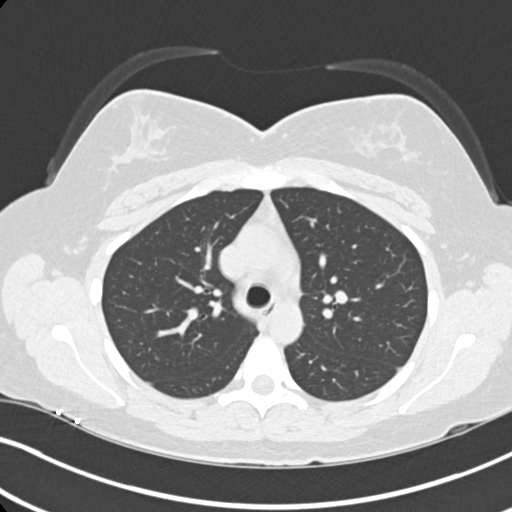
[im 124/161  lung]
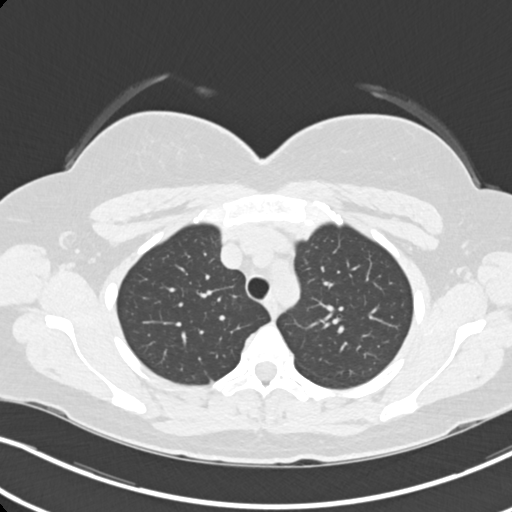
[im 136/161  lung]
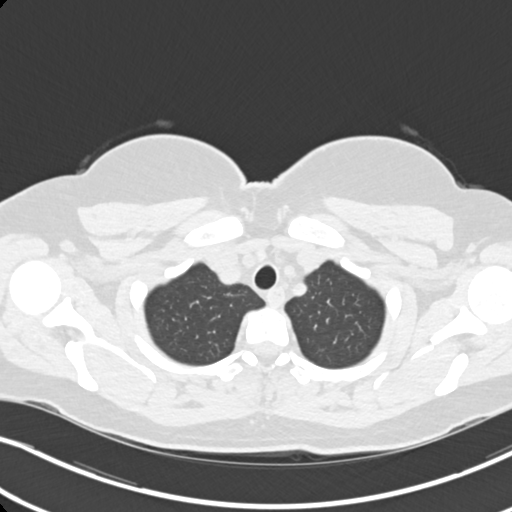
[im 148/161  lung]
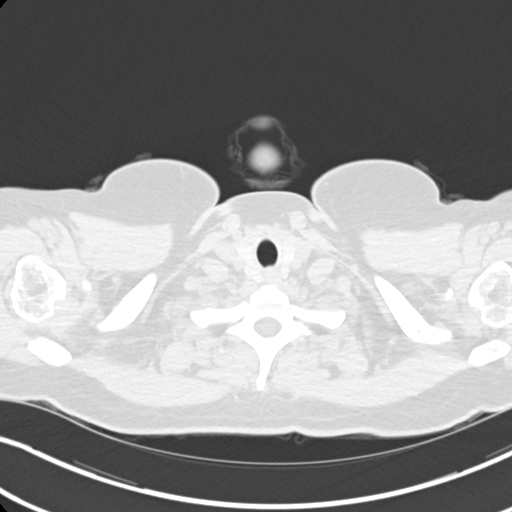

[Series 5: coronals chest 2.00 cor · coronal · 0.63mm/px · 3 of 153 slices shown]
[im 31/153  lung]
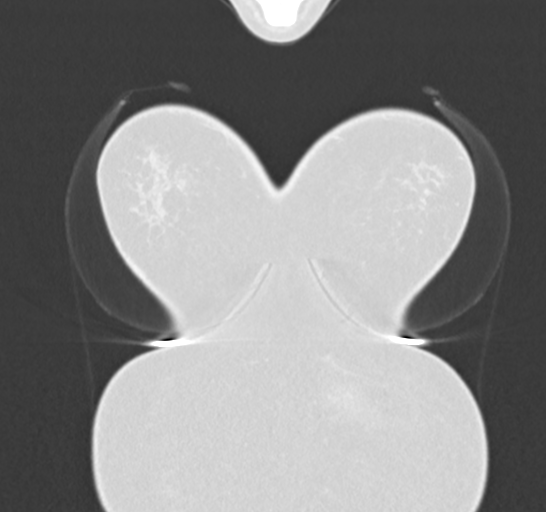
[im 61/153  lung]
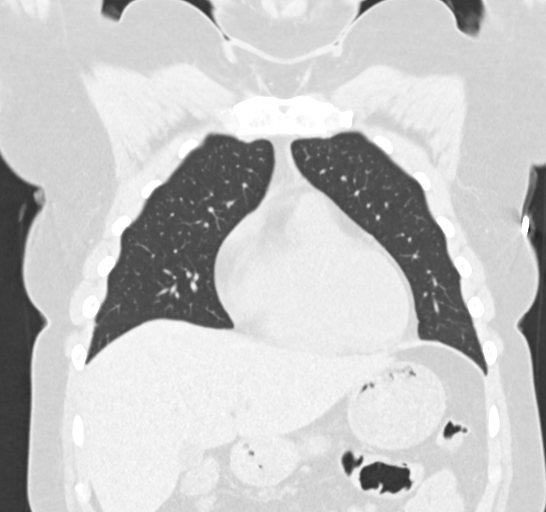
[im 92/153  lung]
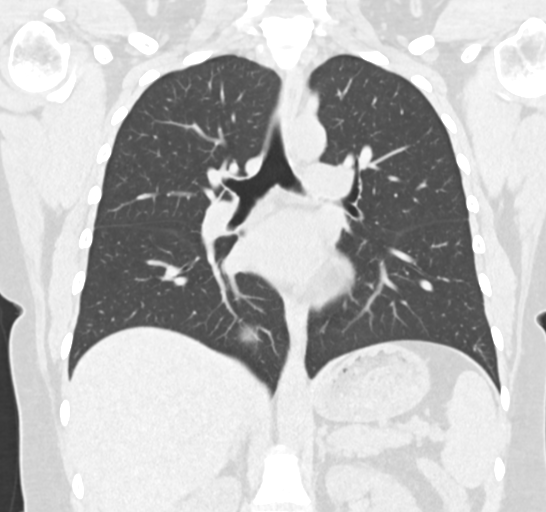

[15 of 36 positions shown; findings below may reference images not displayed]

FINDINGS: Cardiovascular: No significant vascular findings. Normal heart size.
No pericardial effusion. The central pulmonary arteries are of
normal caliber. The thoracic aorta is normal.

Mediastinum/Nodes: No enlarged mediastinal or axillary lymph nodes.
Thyroid gland, trachea, and esophagus demonstrate no significant
findings. Mild residual thymic tissue within the anterior
mediastinum.

Lungs/Pleura: Lungs are clear. No pleural effusion or pneumothorax.

Upper Abdomen: No acute abnormality.

Musculoskeletal: No chest wall mass or suspicious bone lesions
identified.
IMPRESSION: Normal examination. No radiographic explanation for the patient's
reported symptoms.

## 2020-08-03 ENCOUNTER — Other Ambulatory Visit (HOSPITAL_COMMUNITY)
Admission: RE | Admit: 2020-08-03 | Discharge: 2020-08-03 | Disposition: A | Discharge status: Home / Self Care | Payer: Managed Care, Other (non HMO) | Source: Ambulatory Visit | Attending: Pulmonary Disease | Admitting: Pulmonary Disease

## 2020-08-03 DIAGNOSIS — Z01812 Encounter for preprocedural laboratory examination: Secondary | ICD-10-CM | POA: Diagnosis present

## 2020-08-03 DIAGNOSIS — Z20822 Contact with and (suspected) exposure to covid-19: Secondary | ICD-10-CM | POA: Insufficient documentation

## 2020-08-03 LAB — SARS CORONAVIRUS 2 (TAT 6-24 HRS): SARS Coronavirus 2: NEGATIVE

## 2020-08-06 ENCOUNTER — Encounter: Payer: Self-pay | Admitting: Pulmonary Disease

## 2020-08-06 ENCOUNTER — Other Ambulatory Visit: Payer: Self-pay

## 2020-08-06 ENCOUNTER — Ambulatory Visit (INDEPENDENT_AMBULATORY_CARE_PROVIDER_SITE_OTHER): Payer: Managed Care, Other (non HMO) | Admitting: Pulmonary Disease

## 2020-08-06 VITALS — BP 118/80 | HR 85 | Temp 98.3°F | Ht 65.5 in | Wt 177.2 lb

## 2020-08-06 DIAGNOSIS — R0602 Shortness of breath: Secondary | ICD-10-CM

## 2020-08-06 DIAGNOSIS — R059 Cough, unspecified: Secondary | ICD-10-CM | POA: Diagnosis not present

## 2020-08-06 DIAGNOSIS — R0609 Other forms of dyspnea: Secondary | ICD-10-CM

## 2020-08-06 DIAGNOSIS — R0600 Dyspnea, unspecified: Secondary | ICD-10-CM | POA: Diagnosis not present

## 2020-08-06 LAB — PULMONARY FUNCTION TEST
DL/VA % pred: 138 %
DL/VA: 6.26 ml/min/mmHg/L
DLCO cor % pred: 122 %
DLCO cor: 28.81 ml/min/mmHg
DLCO unc % pred: 122 %
DLCO unc: 28.72 ml/min/mmHg
FEF 25-75 Post: 3.5 L/sec
FEF 25-75 Pre: 3 L/sec
FEF2575-%Change-Post: 16 %
FEF2575-%Pred-Post: 99 %
FEF2575-%Pred-Pre: 85 %
FEV1-%Change-Post: 4 %
FEV1-%Pred-Post: 96 %
FEV1-%Pred-Pre: 92 %
FEV1-Post: 3.19 L
FEV1-Pre: 3.06 L
FEV1FVC-%Change-Post: 3 %
FEV1FVC-%Pred-Pre: 97 %
FEV6-%Change-Post: 0 %
FEV6-%Pred-Post: 96 %
FEV6-%Pred-Pre: 95 %
FEV6-Post: 3.77 L
FEV6-Pre: 3.73 L
FEV6FVC-%Pred-Post: 101 %
FEV6FVC-%Pred-Pre: 101 %
FVC-%Change-Post: 0 %
FVC-%Pred-Post: 95 %
FVC-%Pred-Pre: 94 %
FVC-Post: 3.77 L
FVC-Pre: 3.74 L
Post FEV1/FVC ratio: 85 %
Post FEV6/FVC ratio: 100 %
Pre FEV1/FVC ratio: 82 %
Pre FEV6/FVC Ratio: 100 %
RV % pred: 84 %
RV: 1.25 L
TLC % pred: 92 %
TLC: 4.9 L

## 2020-08-06 NOTE — Progress Notes (Signed)
Gabrielle Zavala    [id number]    [birth year]  Primary Care Physician:Perini, Loraine Leriche, MD  Referring Physician: Rodrigo Ran, MD [address] [place],  [place] [address]  Chief complaint:   Patient with complaints of chest discomfort, mucus production   HPI: No change in symptoms Inhalers did not help  Had a CT scan of her chest-within normal limits Pulmonary function test today-within normal limits  Was treated with antibiotics and steroids in the past  Inhalers, albuterol with no relief Symptoms of been for about 9 to 10 months  Denies any nasal symptoms of congestion Brings up clear foamy secretions all the time Denies symptoms of postnasal drip Denies any symptoms of reflux  She is at about 3 x-rays in the course of the illness and they have all been negative Did not improve with inhalers as stated above  No recent changes in environment Stay-at-home mom She has a pet dog that she is on for 7 years  No recent travel  Dad asked NSIP  Remote smoking history over 6 years ago-socially Did try marijuana and trying to get off of antidepressants-no longer using this  No history of mold exposure-Home environment has been tested  She does feel some shortness of breath at rest but not with activity.  Outpatient Encounter Medications as of 08/06/2020  Medication Sig   acetaminophen (TYLENOL) 325 MG tablet Take 3 tablets (975 mg total) by mouth every 6 (six) hours as needed for moderate pain or headache.   albuterol (VENTOLIN HFA) 108 (90 Base) MCG/ACT inhaler Inhale 2 puffs into the lungs every 4 (four) hours as needed.   ALPRAZolam (XANAX) 1 MG tablet Take 1 mg by mouth 3 (three) times daily as needed.   cyclobenzaprine (FLEXERIL) 10 MG tablet Take 10 mg by mouth at bedtime as needed.   hydroxychloroquine (PLAQUENIL) 200 MG tablet TAKE ONE TABLET BY MOUTH TWICE DAILY FOR FIVE DAYS with food   Magnesium Hydroxide 2400 MG/10ML SUSP Take 1 tablet by mouth  daily.   Melatonin 10 MG TABS Take by mouth.   Multiple Vitamins-Minerals (EMERGEN-C IMMUNE) PACK Take 1 packet by mouth daily as needed (For cold symptoms.).   No facility-administered encounter medications on file as of 08/06/2020.    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   Colposcopic biopsy   Pre-eclampsia    Vaginal Pap smear, abnormal     Past Surgical History:  Procedure Laterality Date   COLPOSCOPY  05/2011   lgsil   NO PAST SURGERIES     WISDOM TOOTH EXTRACTION      Family History  Problem Relation Age of Onset   Liver disease Mother    Breast cancer Mother 70   Diabetes Maternal Uncle     Social History   Socioeconomic History   Marital status: Married    Spouse name: Not on file   Number of children: Not on file   Years of education: Not on file   Highest education level: Not on file  Occupational History   Not on file  Tobacco Use   Smoking status: Never Smoker   Smokeless tobacco: Never Used  Substance and Sexual  Activity   Alcohol use: No    Comment: 3-4 times a week   Drug use: No    Comment: past use   Sexual activity: Yes    Partners: Male    Birth control/protection: None  Other Topics Concern   Not on file  Social History Narrative   Not on file   Social Determinants of Health   Financial Resource Strain:    Difficulty of Paying Living Expenses: Not on file  Food Insecurity:    Worried About Programme researcher, broadcasting/film/video in the Last Year: Not on file   [employer] of Food in the Last Year: Not on file  Transportation Needs:    Lack of Transportation (Medical): Not on file   Lack of Transportation (Non-Medical): Not on file  Physical  Activity:    Days of Exercise per Week: Not on file   Minutes of Exercise per Session: Not on file  Stress:    Feeling of Stress : Not on file  Social Connections:    Frequency of Communication with Friends and Family: Not on file   Frequency of Social Gatherings with Friends and Family: Not on file   Attends Religious Services: Not on file   Active Member of Clubs or Organizations: Not on file   Attends Banker Meetings: Not on file   Marital Status: Not on file  Intimate Partner Violence:    Fear of Current or Ex-Partner: Not on file   Emotionally Abused: Not on file   Physically Abused: Not on file   Sexually Abused: Not on file    Review of Systems  Constitutional: Negative for activity change.  Respiratory: Positive for cough and shortness of breath.     Vitals:   08/06/20 1358  BP: 118/80  Pulse: 85  Temp: 98.3 F (36.8 C)  SpO2: 98%     Physical Exam Constitutional:      Appearance: Normal appearance.  HENT:     Head: Normocephalic.     Mouth/Throat:     Mouth: Mucous membranes are moist.  Eyes:     General:        Right eye: No discharge.        Left eye: No discharge.  Cardiovascular:     Rate and Rhythm: Normal rate and regular rhythm.     Pulses: Normal pulses.     Heart sounds: Normal heart sounds. No murmur heard.  No friction rub.  Pulmonary:     Effort: Pulmonary effort is normal. No respiratory distress.     Breath sounds: Normal breath sounds. No stridor. No wheezing or rhonchi.  Musculoskeletal:     Cervical back: No rigidity or tenderness.  Neurological:     Mental Status: She is alert.  Psychiatric:        Mood and Affect: Mood normal.   Data Reviewed: Records from primary's office reviewed  CT scan reviewed with the patient PFT reviewed with the patient  Blood work within normal limits  Assessment:  Cough -Stable   Shortness of breath -No significant change -No significant  improvement  Persistent mucus production  Plan/Recommendations:  No significant changes to be made at present  Encouraged to call with any significant concerns  She does not need to continue inhalers as they have not helped  I will be glad to see her again however -Graded exercises on regular activities should help symptoms -Encouraged to call with any significant concerns  We will see as needed  Gabrielle Zavala  Wynona Neat MD [facility] 08/06/2020, 2:16 PM  CC: Rodrigo Ran, MD

## 2020-08-06 NOTE — Patient Instructions (Signed)
Your breathing study was within normal limits Your CT scan as we reviewed was within normal limits  For cough and mucus production  Over-the-counter N-acetylcysteine may help with mucus thinning and assist with clearance  Mucinex may also help with mucus management  Call with significant concerns  I will see you as needed  It is reassuring that your CT scan and PFT were within normal limits

## 2020-08-06 NOTE — Progress Notes (Signed)
PFT done today. 

## 2021-03-09 ENCOUNTER — Other Ambulatory Visit: Payer: Self-pay

## 2021-03-09 ENCOUNTER — Ambulatory Visit (HOSPITAL_COMMUNITY)
Admission: EM | Admit: 2021-03-09 | Discharge: 2021-03-09 | Disposition: A | Discharge status: Home / Self Care | Payer: 59 | Attending: Psychiatry | Admitting: Psychiatry

## 2021-03-09 DIAGNOSIS — F411 Generalized anxiety disorder: Secondary | ICD-10-CM | POA: Diagnosis not present

## 2021-03-09 DIAGNOSIS — F410 Panic disorder [episodic paroxysmal anxiety] without agoraphobia: Secondary | ICD-10-CM

## 2021-03-09 DIAGNOSIS — G4700 Insomnia, unspecified: Secondary | ICD-10-CM | POA: Diagnosis not present

## 2021-03-09 MED ORDER — QUETIAPINE FUMARATE 50 MG PO TABS: 50.0000 mg | ORAL_TABLET | Freq: Every day | ORAL | 1 refills | Status: DC

## 2021-03-09 NOTE — ED Notes (Signed)
Discharge instructions provided and Pt stated understanding. Pt alert, orient and ambulatory. Pt escorted to the front lobby. Safety maintained.  

## 2021-03-09 NOTE — BH Assessment (Signed)
TTS triage: Patient presents to [facility] with her mother reporting increased anxiety and little to no sleep x 1 week. She denies SI/HI/AVH. She is tearful during triage and appears panicked. She reports that she takes Vybrid and Xanax daily. Her BP is high. She reports eating 2 weed brownies last night.  Patient is urgent.

## 2021-03-09 NOTE — Discharge Instructions (Addendum)

## 2021-03-09 NOTE — ED Provider Notes (Signed)
[facility] Medical Screening Exam  Patient Name: Gabrielle Zavala MRN: [id number] Date of Evaluation: 03/09/21 Chief Complaint:   Diagnosis:  Final diagnoses:  GAD (generalized anxiety disorder)  Panic attacks  Insomnia, unspecified type    History of Present illness: Gabrielle Zavala is a 35 y.o. female. Patient presents as a walk-in to the [facility] accompanied by her mother voluntarily.  Patient presents because she has been having panic attacks and severe anxiety.  Patient reports a history of anxiety, benzodiazepine dependence, GAD, postpartum depression.  She reports that in February her symptoms started getting a little bit worse because her daughter was in the hospital.  She states that she is prescribed Viibryd and was on 10 mg and then increased it to 20.  She states she restarted her medication and March.  Patient also reports that she was prescribed Xanax 1 mg p.o. 3 times daily that she does not take 1 mg at a dose.  She reports that normally she breaks it in half.  She denies taking this medication as prescribed.  Patient reports that she has not been sleeping, has ups and downs, has insomnia and that yesterday she ate 2 pot brownies.  She states that normally marijuana makes her calm down and she is able to sleep, however she states that she has not been sleeping at all.  She denies any suicidal or homicidal ideations and denies any hallucinations.  After a lengthy discussion with patient and her mother encouraged patient to take her Xanax as prescribed of 1 mg p.o. 3 times daily and to continue the Viibryd at the current dose.  Patient was also provided with a prescription for Seroquel 50 mg p.o. nightly and may take 2 tablets at bedtime to assist with sleep.  Patient was also informed to establish care with a therapist as well as a psychiatrist.  She states that she did have a therapist but is switching therapist and has an appointment on May 31.  There is a concern for the  patient possibly having bipolar disorder that has been undiagnosed based off of her symptoms.  The patient was educated on this and was requested to see a psychiatrist for better determination.  Psychiatric Specialty Exam  Presentation  General Appearance:Appropriate for Environment; Casual  Eye Contact:Good  Speech:Clear and Coherent; Normal Rate  Speech Volume:Normal  Handedness:Right   Mood and Affect  Mood:Anxious; Dysphoric  Affect:Appropriate; Congruent   Thought Process  Thought Processes:Coherent  Descriptions of Associations:Intact  Orientation:Full (Time, Place and Person)  Thought Content:Rumination    Hallucinations:None  Ideas of Reference:None  Suicidal Thoughts:No  Homicidal Thoughts:No   Sensorium  Memory:Immediate Good; Recent Good; Remote Good  Judgment:Fair  Insight:Fair   Executive Functions  Concentration:Fair  Attention Span:Good  Recall:Good  Fund of Knowledge:Good  Language:Good   Psychomotor Activity  Psychomotor Activity:Normal   Assets  Assets:Communication Skills; Desire for Improvement; Financial Resources/Insurance; Housing; Physical Health; Social Support; Transportation   Sleep  Sleep:Poor  Number of hours: No data recorded  No data recorded  Physical Exam: Physical Exam Vitals and nursing note reviewed.  Constitutional:      Appearance: She is well-developed.  HENT:     Head: Normocephalic.  Eyes:     Pupils: Pupils are equal, round, and reactive to light.  Cardiovascular:     Rate and Rhythm: Normal rate.  Pulmonary:     Effort: Pulmonary effort is normal.  Musculoskeletal:        General: Normal range  of motion.  Neurological:     Mental Status: She is alert and oriented to person, place, and time.    Review of Systems  Constitutional: Negative.   HENT: Negative.   Eyes: Negative.   Respiratory: Negative.   Cardiovascular: Negative.   Gastrointestinal: Negative.   Genitourinary:  Negative.   Musculoskeletal: Negative.   Skin: Negative.   Neurological: Negative.   Endo/Heme/Allergies: Negative.   Psychiatric/Behavioral: The patient is nervous/anxious.    Blood pressure (!) 149/106, pulse 83, temperature 97.8 F (36.6 C), temperature source Oral, resp. rate 16, SpO2 100 %. There is no height or weight on file to calculate BMI.  Musculoskeletal: Strength & Muscle Tone: within normal limits Gait & Station: normal Patient leans: N/A   [facility] MSE Discharge Disposition for Follow up and Recommendations: Based on my evaluation the patient does not appear to have an emergency medical condition and can be discharged with resources and follow up care in outpatient services for Medication Management and Individual Therapy   Maryfrances Bunnell, FNP 03/09/2021, 12:08 PM

## 2021-03-25 ENCOUNTER — Other Ambulatory Visit: Payer: Self-pay

## 2021-03-25 ENCOUNTER — Emergency Department (HOSPITAL_BASED_OUTPATIENT_CLINIC_OR_DEPARTMENT_OTHER)
Admission: EM | Admit: 2021-03-25 | Discharge: 2021-03-25 | Disposition: A | Discharge status: Home / Self Care | Payer: Managed Care, Other (non HMO) | Attending: Emergency Medicine | Admitting: Emergency Medicine

## 2021-03-25 ENCOUNTER — Encounter (HOSPITAL_BASED_OUTPATIENT_CLINIC_OR_DEPARTMENT_OTHER): Payer: Self-pay | Admitting: Emergency Medicine

## 2021-03-25 DIAGNOSIS — B349 Viral infection, unspecified: Secondary | ICD-10-CM | POA: Diagnosis not present

## 2021-03-25 DIAGNOSIS — R509 Fever, unspecified: Secondary | ICD-10-CM | POA: Diagnosis present

## 2021-03-25 MED ORDER — KETOROLAC TROMETHAMINE 15 MG/ML IJ SOLN
15.0000 mg | Freq: Once | INTRAMUSCULAR | Status: AC
  Administered 2021-03-25: 17:00:00 15 mg via INTRAMUSCULAR
  Filled 2021-03-25: qty 1

## 2021-03-25 NOTE — ED Provider Notes (Signed)
[facility] EMERGENCY DEPT Provider Note   CSN: [id number] Arrival date & time: 03/25/21  1638     History Chief Complaint  Patient presents with   Fever    Gabrielle Zavala is a 35 y.o. female.  35 yo F with a chief complaints of fevers chills sore throat going on for a couple days.  Her children have hand-foot-and-mouth disease and she is worried that she may have contracted it.  She is having severe myalgias mostly to her low back and legs.  She feels like she is in severe pain and needed urgent evaluation.  She called her family doctor who suggested she come to the ED.  She had COVID in January and thinks this feels worse.  No trouble breathing no cough no nausea or vomiting.  Had a couple loose stools yesterday.  The history is provided by the patient.  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   2015 positive high risk HPV negative subtype 16/18/45   Hx of preeclampsia, prior pregnancy, currently pregnant    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   Colposcopic biopsy   Pre-eclampsia    Vaginal Pap smear, abnormal     Patient Active Problem List   Diagnosis Date Noted   Preeclampsia 10/07/2017   Postpartum state 10/07/2017   Palpitations 05/25/2017   Preeclampsia, severe 03/21/2016   Pre-eclampsia during pregnancy in third trimester, antepartum 03/18/2016   BMI 28.0-28.9,adult  01/29/2015   Eczema 09/03/2014   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   ADD (attention deficit disorder) 05/09/2012   GDM (gestational diabetes mellitus) 06/09/2011   Anxiety 06/09/2011   Pre-eclampsia 06/09/2011    Past Surgical History:  Procedure Laterality Date   COLPOSCOPY  05/2011   lgsil   NO PAST SURGERIES     WISDOM TOOTH EXTRACTION       OB History     Gravida  3   Para  3   Term  1   Preterm  2   AB      Living  3      SAB  0   IAB      Ectopic      Multiple  0   Live Births  3        Obstetric Comments  IOL for G1 & G2 d/t preeclampsia         Family History  Problem Relation Age of Onset   Liver disease Mother    Breast cancer Mother 60   Diabetes Maternal Uncle     Social History   Tobacco Use   Smoking status: Never   Smokeless tobacco: Never  Substance Use Topics   Alcohol use: No    Comment: 3-4 times a week   Drug use: No    Comment: past use    Home Medications Prior to Admission medications   Medication Sig Start Date End Date Taking? Authorizing Provider  acetaminophen (TYLENOL) 325 MG tablet Take 3 tablets (975 mg total) by mouth every 6 (six) hours as needed for moderate pain or headache. 10/15/17   Sharyon Cable, CNM  albuterol (VENTOLIN HFA) 108 (90 Base) MCG/ACT inhaler Inhale 2 puffs into the lungs every 4 (four) hours as needed. 02/26/20   [provider]  ALPRAZolam Prudy Feeler) 1 MG tablet Take 1 mg by mouth 3 (three) times daily as needed. 05/15/20   [provider]  cyclobenzaprine (FLEXERIL) 10 MG tablet Take 10 mg by mouth at bedtime as needed. 05/18/20   [provider]  hydroxychloroquine (PLAQUENIL) 200 MG tablet TAKE ONE TABLET BY MOUTH TWICE DAILY FOR FIVE DAYS with food 06/12/20   [provider]  Magnesium Hydroxide 2400 MG/10ML SUSP Take 1 tablet by mouth daily.    [provider]  Melatonin 10 MG TABS Take by mouth. 11/19/18   [provider]  Multiple  Vitamins-Minerals (EMERGEN-C IMMUNE) PACK Take 1 packet by mouth daily as needed (For cold symptoms.).    [provider]  QUEtiapine (SEROQUEL) 50 MG tablet Take 1 tablet (50 mg total) by mouth at bedtime. May take 2 to assist with sleep 03/09/21   Money, Gerlene Burdock, FNP    Allergies    Patient has no known allergies.  Review of Systems   Review of Systems  Constitutional:  Positive for chills and fever.  HENT:  Positive for sore throat. Negative for congestion and rhinorrhea.   Eyes:  Negative for redness and visual disturbance.  Respiratory:  Negative for shortness of breath and wheezing.   Cardiovascular:  Negative for chest pain and palpitations.  Gastrointestinal:  Negative for nausea and vomiting.  Genitourinary:  Negative for dysuria and urgency.  Musculoskeletal:  Positive for myalgias. Negative for arthralgias.  Skin:  Negative for pallor and wound.  Neurological:  Negative for dizziness and headaches.   Physical Exam Updated Vital Signs BP 129/88 (BP Location: Right Arm)   Pulse 93   Temp 99.6 F (37.6 C) (Oral)   Resp 18   Ht 5\' 5"  (1.651 m)   Wt 69.4 kg   SpO2 98%   BMI 25.46 kg/m   Physical Exam Vitals and nursing note reviewed.  Constitutional:      General: She is not in acute distress.    Appearance: She is well-developed. She is not diaphoretic.  HENT:     Head: Normocephalic and atraumatic.     Comments: Swollen turbinates, posterior nasal drip.   Eyes:     Pupils: Pupils are equal, round, and reactive to light.  Cardiovascular:     Rate and Rhythm: Normal rate and regular rhythm.     Heart sounds: No murmur heard.   No friction rub. No gallop.  Pulmonary:     Effort: Pulmonary effort is normal.     Breath sounds: No wheezing or rales.  Abdominal:     General: There is no distension.     Palpations: Abdomen is soft.     Tenderness: There is no abdominal tenderness.  Musculoskeletal:        General: No tenderness.     Cervical back:  Normal range of motion and neck supple.  Skin:    General: Skin is warm and dry.  Neurological:     Mental Status: She is alert and oriented to person, place, and time.  Psychiatric:        Behavior: Behavior normal.    ED Results / Procedures / Treatments  Labs (all labs ordered are listed, but only abnormal results are displayed) Labs Reviewed - No data to display  EKG None  Radiology No results found.  Procedures Procedures   Medications Ordered in ED Medications  ketorolac (TORADOL) 15 MG/ML injection 15 mg (has no administration in time range)    ED Course  I have reviewed the triage vital signs and the nursing notes.  Pertinent labs & imaging results that were available during my care of the patient were reviewed by me and considered in my medical decision making (see chart for details).    MDM Rules/Calculators/A&P                          35 yo F with a chief complaints of what sounds like a viral syndrome.  Patient's children also have hand-foot and mouth disease.  Possible that she has the same no obvious lesions in the posterior oropharynx or in the hands or the feet.  She is complaining mostly of back pain though no has no midline tenderness seems to be mostly localized to the musculature.  We will have her treat supportively.  PCP follow-up.  5:03 PM:  I have discussed the diagnosis/risks/treatment options with the patient and believe the pt to be eligible for discharge home to follow-up with PCP. We also discussed returning to the ED immediately if new or worsening sx occur. We discussed the sx which are most concerning (e.g., sudden worsening pain,  inability to tolerate by mouth) that necessitate immediate return. Medications administered to the patient during their visit and any new prescriptions provided to the patient are listed below.  Medications given during this visit Medications  ketorolac (TORADOL) 15 MG/ML injection 15 mg (has no administration in  time range)     The patient appears reasonably screen and/or stabilized for discharge and I doubt any other medical condition or other [facility] requiring further screening, evaluation, or treatment in the ED at this time prior to discharge.   Final Clinical Impression(s) / ED Diagnoses Final diagnoses:  Acute viral syndrome    Rx / DC Orders ED Discharge Orders     None        Melene Plan, DO 03/25/21 1703

## 2021-03-25 NOTE — Discharge Instructions (Addendum)
Take tylenol 2 pills 4 times a day and motrin 4 pills 3 times a day.  Drink plenty of fluids.  Return for worsening shortness of breath, headache, confusion. Follow up with your family doctor.   

## 2021-03-25 NOTE — ED Triage Notes (Addendum)
Fever and body aches  started last night last tylenol at 2 pm , children have hand , foot and mouth, , some diarrhea  yesterday and she has a sore throat also has sever anxiety and husband is out of town , her mom has the kids

## 2022-04-07 ENCOUNTER — Other Ambulatory Visit: Payer: Self-pay | Admitting: Obstetrics and Gynecology

## 2022-04-07 DIAGNOSIS — N644 Mastodynia: Secondary | ICD-10-CM

## 2022-05-11 ENCOUNTER — Ambulatory Visit
Admission: RE | Admit: 2022-05-11 | Discharge: 2022-05-11 | Disposition: A | Discharge status: Home / Self Care | Payer: Managed Care, Other (non HMO) | Source: Ambulatory Visit | Attending: Obstetrics and Gynecology | Admitting: Obstetrics and Gynecology

## 2022-05-11 DIAGNOSIS — N644 Mastodynia: Secondary | ICD-10-CM

## 2022-09-15 ENCOUNTER — Encounter: Payer: Self-pay | Admitting: Internal Medicine

## 2022-10-27 ENCOUNTER — Ambulatory Visit: Payer: Managed Care, Other (non HMO) | Admitting: Internal Medicine

## 2022-12-22 ENCOUNTER — Ambulatory Visit: Payer: Managed Care, Other (non HMO) | Admitting: Internal Medicine

## 2023-11-21 ENCOUNTER — Emergency Department (HOSPITAL_COMMUNITY): Payer: Managed Care, Other (non HMO)

## 2023-11-21 ENCOUNTER — Other Ambulatory Visit: Payer: Self-pay

## 2023-11-21 ENCOUNTER — Emergency Department (HOSPITAL_COMMUNITY)
Admission: EM | Admit: 2023-11-21 | Discharge: 2023-11-22 | Disposition: A | Discharge status: Home / Self Care | Payer: Managed Care, Other (non HMO) | Attending: Emergency Medicine | Admitting: Emergency Medicine

## 2023-11-21 ENCOUNTER — Encounter (HOSPITAL_COMMUNITY): Payer: Self-pay | Admitting: Emergency Medicine

## 2023-11-21 DIAGNOSIS — R079 Chest pain, unspecified: Secondary | ICD-10-CM | POA: Insufficient documentation

## 2023-11-21 DIAGNOSIS — R202 Paresthesia of skin: Secondary | ICD-10-CM | POA: Diagnosis not present

## 2023-11-21 DIAGNOSIS — R002 Palpitations: Secondary | ICD-10-CM | POA: Diagnosis not present

## 2023-11-21 LAB — COMPREHENSIVE METABOLIC PANEL
ALT: 26 U/L (ref 0–44)
AST: 22 U/L (ref 15–41)
Albumin: 4.3 g/dL (ref 3.5–5.0)
Alkaline Phosphatase: 48 U/L (ref 38–126)
Anion gap: 11 (ref 5–15)
BUN: 11 mg/dL (ref 6–20)
CO2: 25 mmol/L (ref 22–32)
Calcium: 9.5 mg/dL (ref 8.9–10.3)
Chloride: 102 mmol/L (ref 98–111)
Creatinine, Ser: 0.73 mg/dL (ref 0.44–1.00)
GFR, Estimated: >60 mL/min (ref >60)
Glucose, Bld: 102 mg/dL — ABNORMAL HIGH (ref 70–99)
Potassium: 3.7 mmol/L (ref 3.5–5.1)
Sodium: 138 mmol/L (ref 135–145)
Total Bilirubin: 0.4 mg/dL (ref 0.0–1.2)
Total Protein: 7.1 g/dL (ref 6.5–8.1)

## 2023-11-21 LAB — CBC WITH DIFFERENTIAL/PLATELET
Abs Immature Granulocytes: 0.01 10*3/uL (ref 0.00–0.07)
Basophils Absolute: 0.1 10*3/uL (ref 0.0–0.1)
Basophils Relative: 2 %
Eosinophils Absolute: 0.2 10*3/uL (ref 0.0–0.5)
Eosinophils Relative: 4 %
HCT: 41.2 % (ref 36.0–46.0)
Hemoglobin: 13.6 g/dL (ref 12.0–15.0)
Immature Granulocytes: 0 %
Lymphocytes Relative: 34 %
Lymphs Abs: 2.2 10*3/uL (ref 0.7–4.0)
MCH: 30.5 pg (ref 26.0–34.0)
MCHC: 33 g/dL (ref 30.0–36.0)
MCV: 92.4 fL (ref 80.0–100.0)
Monocytes Absolute: 0.4 10*3/uL (ref 0.1–1.0)
Monocytes Relative: 7 %
Neutro Abs: 3.4 10*3/uL (ref 1.7–7.7)
Neutrophils Relative %: 53 %
Platelets: 213 10*3/uL (ref 150–400)
RBC: 4.46 MIL/uL (ref 3.87–5.11)
RDW: 12.6 % (ref 11.5–15.5)
WBC: 6.3 10*3/uL (ref 4.0–10.5)
nRBC: 0 % (ref 0.0–0.2)

## 2023-11-21 LAB — TROPONIN I (HIGH SENSITIVITY): Troponin I (High Sensitivity): <2 ng/L (ref <18)

## 2023-11-21 LAB — TSH: TSH: 2.067 u[IU]/mL (ref 0.350–4.500)

## 2023-11-21 NOTE — ED Triage Notes (Signed)
Patient complaining of left sided chest pain describes as dull since friday. Patient also reports intermittent tingling in thumb since Friday. States that it feels like her heart is skipping beats. Denies cardiac hx.

## 2023-11-21 NOTE — ED Provider Triage Note (Signed)
 Emergency Medicine Provider Triage Evaluation Note  Gabrielle Zavala , a 38 y.o. female  was evaluated in triage.  Pt complains of intermittent left-sided chest pain for 5 days.  Also reports some intermittent tingling in her thumb since Friday.  States it feels like her heart sometimes skips beats.  Denies any history of cardiac issues.  Review of Systems  Positive: As above Negative: As above  Physical Exam  BP 139/84 (BP Location: Left Arm)   Pulse 85   Temp 98.3 F (36.8 C) (Oral)   Resp 18   Ht 5' 5 (1.651 m)   Wt 63.5 kg   LMP 11/09/2023 (Approximate)   SpO2 100%   BMI 23.30 kg/m  Gen:   Awake, no distress   Resp:  Normal effort  MSK:   Moves extremities without difficulty    Medical Decision Making  Medically screening exam initiated at 9:26 PM.  Appropriate orders placed.  Gabrielle Zavala was informed that the remainder of the evaluation will be completed by another provider, this initial triage assessment does not replace that evaluation, and the importance of remaining in the ED until their evaluation is complete.     Veta Palma, PA-C 11/21/23 2127

## 2023-11-22 LAB — TROPONIN I (HIGH SENSITIVITY): Troponin I (High Sensitivity): <2 ng/L (ref <18)

## 2023-11-22 NOTE — ED Notes (Signed)
 Pt seen in Peds triage area for fast track.

## 2023-11-22 NOTE — Discharge Instructions (Signed)
 You were seen in the emergency department for chest pain. Your blood work EKG and chest x-ray all looked okay You are not having a heart attack based on our test today It is important that you follow-up with your primary care doctor and cardiologist to discuss today's visit and your symptoms Return to the Emergency Department for severe chest pain trouble breathing or any other concerns

## 2023-11-22 NOTE — ED Provider Notes (Signed)
 [facility] AT [facility] Provider Note   CSN: [id number] Arrival date & time: 11/21/23  2007     History  Chief Complaint  Patient presents with   Chest Pain    Gabrielle Zavala is a 38 y.o. female.  With a history of anxiety presents to the ED for chest pain.  Patient reports that pain began in her left chest on Friday and has a dull aching quality.  She also notes tingling in her left thumb and left index finger that began around that time.  Has noticed some palpitations.  No shortness of breath nausea vomiting diaphoresis fevers chills or recent illness.  Has upcoming appointment with a cardiologist next month   Chest Pain      Home Medications Prior to Admission medications   Medication Sig Start Date End Date Taking? Authorizing Provider  acetaminophen  (TYLENOL ) 325 MG tablet Take 3 tablets (975 mg total) by mouth every 6 (six) hours as needed for moderate pain or headache. 10/15/17   Rogers, Veronica C, CNM  albuterol (VENTOLIN HFA) 108 (90 Base) MCG/ACT inhaler Inhale 2 puffs into the lungs every 4 (four) hours as needed. 02/26/20   [provider]  ALPRAZolam  (XANAX ) 1 MG tablet Take 1 mg by mouth 3 (three) times daily as needed. 05/15/20   [provider]  cyclobenzaprine  (FLEXERIL ) 10 MG tablet Take 10 mg by mouth at bedtime as needed. 05/18/20   [provider]  hydroxychloroquine (PLAQUENIL) 200 MG tablet TAKE ONE TABLET BY MOUTH TWICE DAILY FOR FIVE DAYS with food 06/12/20   [provider]  Magnesium  Hydroxide 2400 MG/10ML SUSP Take 1 tablet by mouth daily.    [provider]  Melatonin 10 MG TABS Take by mouth. 11/19/18   [provider]  Multiple Vitamins-Minerals (EMERGEN-C IMMUNE) PACK Take 1 packet by mouth daily as needed (For cold symptoms.).    [provider]  QUEtiapine  (SEROQUEL ) 50 MG tablet Take 1 tablet (50 mg total) by mouth at bedtime. May take 2 to assist with sleep  03/09/21   Money, Caron NOVAK, FNP      Allergies    Patient has no known allergies.    Review of Systems   Review of Systems  Cardiovascular:  Positive for chest pain.    Physical Exam Updated Vital Signs BP 120/83 (BP Location: Left Arm)   Pulse 69   Temp 98.2 F (36.8 C) (Oral)   Resp 16   Ht 5' 5 (1.651 m)   Wt 63.5 kg   LMP 11/09/2023 (Approximate)   SpO2 100%   BMI 23.30 kg/m  Physical Exam Vitals and nursing note reviewed.  HENT:     Head: Normocephalic and atraumatic.  Eyes:     Pupils: Pupils are equal, round, and reactive to light.  Cardiovascular:     Rate and Rhythm: Normal rate and regular rhythm.  Pulmonary:     Effort: Pulmonary effort is normal.     Breath sounds: Normal breath sounds.  Abdominal:     Palpations: Abdomen is soft.     Tenderness: There is no abdominal tenderness.  Skin:    General: Skin is warm and dry.  Neurological:     Mental Status: She is alert.  Psychiatric:        Mood and Affect: Mood normal.     ED Results / Procedures / Treatments   Labs (all labs ordered are listed, but only abnormal results are displayed) Labs Reviewed  COMPREHENSIVE METABOLIC PANEL - Abnormal; Notable for the following components:      Result Value   Glucose, Bld 102 (*)    All other components within normal limits  CBC WITH DIFFERENTIAL/PLATELET  TSH  TROPONIN I (HIGH SENSITIVITY)  TROPONIN I (HIGH SENSITIVITY)    EKG EKG Interpretation Date/Time:  Tuesday November 21 2023 21:48:29 EST Ventricular Rate:  80 PR Interval:  128 QRS Duration:  86 QT Interval:  378 QTC Calculation: 435 R Axis:   72  Text Interpretation: Normal sinus rhythm Nonspecific ST abnormality Abnormal ECG When compared with ECG of 24-Feb-2005 21:33, HEART RATE has decreased Confirmed by Raford Lenis ([id number]) on 11/21/2023 11:56:30 PM  Radiology DG Chest 2 View Result Date: 11/21/2023 CLINICAL DATA:  Chest pain. Intermittent left chest pain for 5 days. EXAM: CHEST - 2  VIEW COMPARISON:  12/11/2013 FINDINGS: The heart size and mediastinal contours are within normal limits. Both lungs are clear. The visualized skeletal structures are unremarkable. IMPRESSION: No active cardiopulmonary disease. Electronically Signed   By: Elsie Gravely M.D.   On: 11/21/2023 21:55    Procedures Procedures    Medications Ordered in ED Medications - No data to display  ED Course/ Medical Decision Making/ A&P                                 Medical Decision Making 38 year old female with history as below presenting for chest pain that began 5 days ago.  Dull aching quality.  Afebrile and normotensive.  Heart rate in the 60s on my assessment.  Low suspicion for embolism PERC negative.  Low suspicion for ACS as EKG shows no ischemic changes and troponin and delta troponin both less than 2.  Remainder of laboratory workup unremarkable.  States she is not pregnant as her husband has had a vasectomy.  Unclear cause of her chest pain today.  Appropriate for discharge with plan for PCP and cardiology follow-up next month           Final Clinical Impression(s) / ED Diagnoses Final diagnoses:  Chest pain, unspecified type    Rx / DC Orders ED Discharge Orders     None         Pamella Ozell LABOR, DO 11/22/23 [id number]

## 2023-12-04 ENCOUNTER — Ambulatory Visit: Payer: Managed Care, Other (non HMO) | Admitting: Internal Medicine

## 2023-12-05 ENCOUNTER — Encounter: Payer: Self-pay | Admitting: Cardiology

## 2023-12-05 ENCOUNTER — Ambulatory Visit: Payer: Managed Care, Other (non HMO) | Attending: Cardiology | Admitting: Cardiology

## 2023-12-05 VITALS — BP 136/76 | HR 89 | Resp 16 | Ht 65.0 in | Wt 145.8 lb

## 2023-12-05 DIAGNOSIS — H0260 Xanthelasma of unspecified eye, unspecified eyelid: Secondary | ICD-10-CM

## 2023-12-05 DIAGNOSIS — E7800 Pure hypercholesterolemia, unspecified: Secondary | ICD-10-CM

## 2023-12-05 DIAGNOSIS — R072 Precordial pain: Secondary | ICD-10-CM | POA: Diagnosis not present

## 2023-12-05 DIAGNOSIS — R002 Palpitations: Secondary | ICD-10-CM | POA: Diagnosis not present

## 2023-12-05 MED ORDER — METOPROLOL TARTRATE 100 MG PO TABS: ORAL_TABLET | ORAL | 0 refills | Status: DC

## 2023-12-05 NOTE — Progress Notes (Signed)
 Cardiology Office Note:  .   Date:  12/05/2023  ID:  Gabrielle Zavala, DOB 10/24/1985, MRN [id number] PCP: Rodrigo Ran, MD  [facility] Providers Cardiologist:  Yates Decamp, MD   History of Present Illness: Gabrielle Zavala   Gabrielle Zavala is a 38 y.o. female with very mild hypercholesterolemia and xanthoma left eyelid noted about 2 years ago referred to me for cardiac risk stratification as she has been extremely concerned.  In the interim patient has read extensively regarding increased risk of CV disease.  About a week ago she started having sharp chest pain on the left side of the chest and has had 2 ED visits at [facility] and also 1 ED visit at [facility] and discharged home.  She has also noticed occasional palpitations lasting a few seconds she has been extremely concerned as she is a mother of 3 and was tearful stating that she is going to have a cardiac event  Discussed the use of AI scribe software for clinical note transcription with the patient, who gave verbal consent to proceed.  History of Present Illness   The patient, a 38 year old with no known past medical history, presents with chest pain and palpitations that have been ongoing for almost three weeks. The pain is localized to one spot on the chest and is described as constant. The patient reports that the pain is not affected by movement or deep breathing. The palpitations are described as a feeling of the heart skipping a beat, which comes and goes. The patient has sought emergency care multiple times due to these symptoms, but no active heart attack was detected.  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 The patient has read that xanthelasma can be an indicator of heart disease and is concerned about this. The patient denies any family history of premature coronary artery disease. The patient is a non-smoker and does not have diabetes or high blood pressure. The patient is  anxious about her symptoms and the possibility of having heart disease.     Labs    Lab Results  Component Value Date   NA 138 11/21/2023   K 3.7 11/21/2023   CO2 25 11/21/2023   GLUCOSE 102 (H) 11/21/2023   BUN 11 11/21/2023   CREATININE 0.73 11/21/2023   CALCIUM 9.5 11/21/2023   GFRNONAA >60 11/21/2023      Latest Ref Rng & Units 11/21/2023    9:38 PM 10/15/2017   10:18 AM 10/07/2017    7:56 PM  BMP  Glucose 70 - 99 mg/dL 409  98  811   BUN 6 - 20 mg/dL 11  12  11    Creatinine 0.44 - 1.00 mg/dL 9.14  7.82  9.56   Sodium 135 - 145 mmol/L 138  134  136   Potassium 3.5 - 5.1 mmol/L 3.7  4.0  3.6   Chloride 98 - 111 mmol/L 102  103  109   CO2 22 - 32 mmol/L 25  20  20    Calcium 8.9 - 10.3 mg/dL 9.5  8.7  8.5       Latest Ref Rng & Units 11/21/2023    9:38 PM 07/07/2020   10:56 AM 10/15/2017   10:18 AM  CBC  WBC 4.0 - 10.5 K/uL 6.3  6.7  9.6   Hemoglobin 12.0 - 15.0 g/dL 21.3  08.6  57.8   Hematocrit 36.0 - 46.0 % 41.2  40.1  41.5  Platelets 150 - 400 K/uL 213  252.0  330    No results found for: "HGBA1C"  Lab Results  Component Value Date   TSH 2.067 11/21/2023    External Labs:  PCP labs 03/27/2023 from referral records  LDL particle #1223. Small LDL 416 (Normal) Total cholesterol 192, triglycerides 52, HDL 69, LDL 113.  Lp(a) 25.5.  Serum glucose 96 mg, creatinine 0.8, EGFR 82 mL, potassium 4.5, BUN 10.  Hb 13.9/HCT 43.9, platelets 222, normal indicis.  Labs 02/22/2023:  Total cholesterol 238, triglycerides 61, HDL 51, LDL 175.  Non-HDL cholesterol 187.  TSH normal at 0.97.  Review of Systems  Cardiovascular:  Positive for chest pain and palpitations. Negative for dyspnea on exertion and leg swelling.   Physical Exam:   VS:  BP 136/76 (BP Location: Left Arm, Patient Position: Sitting, Cuff Size: Normal)   Pulse 89   Resp 16   Ht 5\' 5"  (1.651 m)   Wt 145 lb 12.8 oz (66.1 kg)   LMP 11/09/2023 (Approximate)   SpO2 97%   BMI 24.26 kg/m    Wt Readings  from Last 3 Encounters:  12/05/23 145 lb 12.8 oz (66.1 kg)  11/21/23 140 lb (63.5 kg)  03/25/21 153 lb (69.4 kg)    Physical Exam Eyes:     Comments: Left medial upper eyelid Xanthoma noted  Neck:     Vascular: No carotid bruit or JVD.  Cardiovascular:     Rate and Rhythm: Normal rate and regular rhythm.     Pulses: Intact distal pulses.     Heart sounds: Normal heart sounds. No murmur heard.    No gallop.  Pulmonary:     Effort: Pulmonary effort is normal.     Breath sounds: Normal breath sounds.  Abdominal:     General: Bowel sounds are normal.     Palpations: Abdomen is soft.  Musculoskeletal:     Right lower leg: No edema.     Left lower leg: No edema.    Studies Reviewed: Gabrielle Zavala    Chest x-ray two-view 11/21/2023: The heart size and mediastinal contours are within normal limits. Both lungs are clear. The visualized skeletal structures are unremarkable.  EKG:    EKG Interpretation Date/Time:  Tuesday December 05 2023 14:28:27 EST Ventricular Rate:  78 PR Interval:  124 QRS Duration:  74 QT Interval:  374 QTC Calculation: 426 R Axis:   48  Text Interpretation: EKG 12/05/2023: Normal sinus rhythm at rate of 78 bpm, normal EKG.  No significant change from 01/19/2024 Confirmed by Delrae Rend [phone]) on 12/05/2023 2:53:00 PM    Medications and allergies    Allergies  Allergen Reactions   Desvenlafaxine     Other Reaction(s): hot flashes, nausea   Fluoxetine Anxiety     Current Outpatient Medications:    Magnesium Glycinate 120 MG CAPS, Take 1 capsule by mouth daily., Disp: , Rfl:    metoprolol tartrate (LOPRESSOR) 100 MG tablet, Take one tablet (100 mg) by mouth 2 hours prior to your cardiac CT., Disp: 1 tablet, Rfl: 0   Multiple Vitamin (MULTIVITAMIN) tablet, Take 1 tablet by mouth daily., Disp: , Rfl:    ASSESSMENT AND PLAN: .      ICD-10-CM   1. Xanthoma of eyelid  H02.60 CT CORONARY MORPH W/CTA COR W/SCORE W/CA W/CM &/OR WO/CM    2. Mild  hypercholesterolemia  E78.00 EKG 12-Lead    CT CORONARY MORPH W/CTA COR W/SCORE W/CA W/CM &/OR WO/CM    3. Precordial  pain  R07.2 CT CORONARY MORPH W/CTA COR W/SCORE W/CA W/CM &/OR WO/CM    4. Palpitations  R00.2 CT CORONARY MORPH W/CTA COR W/SCORE W/CA W/CM &/OR WO/CM      Assessment and Plan    Precordial pain most indicated of costochondritis or musculoskeletal pain. Chronic chest pain at the costosternal junction has persisted for nearly three weeks, constant and worsened by movement. Multiple ER visits showed normal EKG and chest X-ray, with no signs of acute coronary syndrome. Diagnosis confirmed by clinical presentation and absence of cardiac issues. Explained that costochondritis involves inflammation of the cartilage between the ribs and breastbone, likely from minor injury or strain. Pain may persist with periods of exacerbation and relief but is not life-threatening. Recommend Tylenol for pain relief and Aleve daily for the next three to four days to reduce inflammation. Patient extremely concerned about ongoing chest pain and has had 3 ED visits, in view of mild hyperlipidemia, xanthoma, patient's continued symptoms and anxiety and concern about lipid core and coronary arteries, will obtain coronary CTA.  Coronary calcium score will not be helpful as she is very young.  Best option is to obtain plaque burden by coronary CT angiogram.  I discussed starting her on statin therapy.  Patient prefers not to be on therapy unless she has proven plaque burden.  Palpitations Intermittent palpitations described as heart skipping a beat, with no symptoms of acute coronary syndrome, likely benign PACs or PVCs. Explained that palpitations can result from stress, caffeine, lack of sleep, and hormonal changes. Reassured that these are common and not indicative of heart disease. Advise reducing stress and avoiding excessive caffeine.  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 Lipid profile shows LDL at 113 mg/dL, within acceptable range. No familial hypercholesterolemia or significant family history of premature coronary artery disease. Discussed that xanthelasma can indicate elevated lipids but not necessarily heart disease. Explained that statins or PCSK9 inhibitors like Repatha can lower LDL and potentially reduce xanthelasma, but patient prefers to avoid medication. Consider PCSK9 inhibitors like Repatha if necessary in the future. It is also noteworthy that in July 2024, her LDL was 164 but was only 113 by recent NMR LipoProfile.  NMR LipoProfile although measures LDL directly and may be more accurate but will further evaluate discrepancy if her coronary CTA does indeed reveal increased plaque volume.  General Health Maintenance 38 year old with no significant family history of heart disease, non-smoker, and no other major risk factors for coronary artery disease. Encourage maintaining a healthy lifestyle with regular exercise and a balanced diet.  Follow-up Order CT coronary angiogram to assess plaque morphology. Schedule follow-up appointment to discuss results of the CT coronary angiogram.           Signed,  Yates Decamp, MD, [facility] 12/05/2023, 8:45 PM [facility] [address] [place], [place] [address] Phone: [phone]. Fax:  [phone]

## 2023-12-05 NOTE — Patient Instructions (Addendum)
 Medication Instructions:  Your physician recommends that you continue on your current medications as directed. Please refer to the Current Medication list given to you today.  *If you need a refill on your cardiac medications before your next appointment, please call your pharmacy*   Lab Work:  If you have labs (blood work) drawn today and your tests are completely normal, you will receive your results only by: MyChart Message (if you have MyChart) OR A paper copy in the mail If you have any lab test that is abnormal or we need to change your treatment, we will call you to review the results.   Testing/Procedures:    Your cardiac CT will be scheduled at one of the below locations:   [facility] [address] [place], [place] [address] [phone]  OR  [facility] [address] [place], [place] [address] [phone]  OR   [facility] [address] [place], [place] [address] [phone]  OR   [facility] [address] [place], [place] [address] [phone]  If scheduled at [facility], please arrive at the [facility] and Children's Entrance (Entrance C2) of [facility] 30 minutes prior to test start time. You can use the FREE valet parking offered at entrance C (encouraged to control the heart rate for the test)  Proceed to the [facility] Radiology Department (first floor) to check-in and test prep.  All radiology patients and guests should use entrance C2 at [facility], accessed from [facility], even though the hospital's physical address listed is [address].    If scheduled at [facility] or [facility], please arrive 15 mins early for check-in and test prep.  There is spacious parking and easy access to the radiology department from the [facility] V[facility]  entrance. Please enter here and check-in with the desk attendant.   If scheduled at [facility], please arrive 30 minutes early for check-in and test prep.  Please follow these instructions carefully (unless otherwise directed):  An IV will be required for this test and Nitroglycerin will be given.  Hold all erectile dysfunction medications at least 3 days (72 hrs) prior to test. (Ie viagra, cialis, sildenafil, tadalafil, etc)   On the Night Before the Test: Be sure to Drink plenty of water. Do not consume any caffeinated/decaffeinated beverages or chocolate 12 hours prior to your test. Do not take any antihistamines 12 hours prior to your test.   On the Day of the Test: Drink plenty of water until 1 hour prior to the test. Do not eat any food 1 hour prior to test. You may take your regular medications prior to the test.  Take metoprolol (Lopressor) two hours prior to test. If you heart rate is above 70.. if it is not take it with you to the CT   After the Test: Drink plenty of water. After receiving IV contrast, you may experience a mild flushed feeling. This is normal. On occasion, you may experience a mild rash up to 24 hours after the test. This is not dangerous. If this occurs, you can take Benadryl 25 mg, Zyrtec, Claritin, or Allegra and increase your fluid intake. (Patients taking Tikosyn should avoid Benadryl, and may take Zyrtec, Claritin, or Allegra) If you experience trouble breathing, this can be serious. If it is severe call 911 IMMEDIATELY. If it is mild,  please call our office.  We will call to schedule your test 2-4 weeks out understanding that some insurance companies will need an authorization prior to the service being performed.   For more information and frequently asked questions, please visit our website : [email or web address]  For non-scheduling related questions, please contact the cardiac imaging nurse navigator should you have any  questions/concerns: Cardiac Imaging Nurse Navigators Direct Office Dial: [phone]   For scheduling needs, including cancellations and rescheduling, please call [place], [phone].     Follow-Up: At [facility], you and your health needs are our priority.  As part of our continuing mission to provide you with exceptional heart care, we have created designated Provider Care Teams.  These Care Teams include your primary Cardiologist (physician) and Advanced Practice Providers (APPs -  Physician Assistants and Nurse Practitioners) who all work together to provide you with the care you need, when you need it.  We recommend signing up for the patient portal called "MyChart".  Sign up information is provided on this After Visit Summary.  MyChart is used to connect with patients for Virtual Visits (Telemedicine).  Patients are able to view lab/test results, encounter notes, upcoming appointments, etc.  Non-urgent messages can be sent to your provider as well.   To learn more about what you can do with MyChart, go to [email or web address].    Your next appointment:   3 month(s)  Provider:   Yates Decamp, MD     Other Instructions

## 2023-12-07 ENCOUNTER — Encounter (HOSPITAL_COMMUNITY): Payer: Self-pay

## 2023-12-07 DIAGNOSIS — R072 Precordial pain: Secondary | ICD-10-CM

## 2023-12-08 ENCOUNTER — Telehealth (HOSPITAL_COMMUNITY): Payer: Self-pay | Admitting: *Deleted

## 2023-12-08 ENCOUNTER — Telehealth: Payer: Self-pay | Admitting: Cardiology

## 2023-12-08 NOTE — Telephone Encounter (Signed)
 Pt is requesting CTA scheduled for 12/11/23 be changed to only a calcium score CT as she does not want to be injected with dye. Pt advised will forward to Dr Jacinto Halim for review and further recommendation.  Pt verbalizes understanding and agrees with current plan.

## 2023-12-08 NOTE — Telephone Encounter (Signed)
 Pt would like a c/b regarding upcoming CT on Monday. Please advise

## 2023-12-08 NOTE — Telephone Encounter (Signed)
 Reaching out to patient to offer assistance regarding upcoming cardiac imaging study;  patient request to cancel CT.   Patient concerned about receiving contrast and would prefer alternative testing.  Patient has discussed with cardiology and aware to follow up. Johney Frame RN Navigator Cardiac Imaging [facility] [phone] office [phone] cell

## 2023-12-08 NOTE — Telephone Encounter (Signed)
 Keep CTA for now please, I have sent her message and do not agree with calcium scoring

## 2023-12-10 NOTE — Telephone Encounter (Signed)
 Patient is willing to go through the CTA

## 2023-12-11 ENCOUNTER — Ambulatory Visit: Payer: Managed Care, Other (non HMO)

## 2023-12-14 ENCOUNTER — Encounter (HOSPITAL_COMMUNITY): Payer: Self-pay

## 2023-12-15 NOTE — Telephone Encounter (Signed)
 Called and spoke with patient. She states that she is having left sided jaw pain. Rates it 5/10 without any radiating to other areas such as back, neck etc. States has not taken any over the counter pain meds like tylenol or ibuprofen. No precipitating events like an injury. Is just very concerned that this might be heart related.  She has a CT scheduled on Monday 12/18/23. Advised I will forward her message to Dr Jacinto Halim to advise.

## 2023-12-16 ENCOUNTER — Telehealth: Payer: Self-pay | Admitting: Cardiology

## 2023-12-16 MED ORDER — OXYCODONE-ACETAMINOPHEN 5-325 MG PO TABS: 1.0000 | ORAL_TABLET | Freq: Three times a day (TID) | ORAL | 0 refills | Status: DC | PRN

## 2023-12-16 NOTE — Telephone Encounter (Signed)
 ICD-10-CM   1. Precordial pain  R07.2 oxyCODONE-acetaminophen (PERCOCET) 5-325 MG tablet     Meds ordered this encounter  Medications   oxyCODONE-acetaminophen (PERCOCET) 5-325 MG tablet    Sig: Take 1 tablet by mouth every 8 (eight) hours as needed for severe pain (pain score 7-10).    Dispense:  12 tablet    Refill:  0

## 2023-12-16 NOTE — Telephone Encounter (Signed)
 Patient called the answering service this afternoon complaining of left jaw pain that has been going on for 3 days. Reports that she messaged Dr. Jacinto Halim yesterday for further instruction, and he told her that he would send in pain medications. I cannot view their messages in mychart.   Patient reports that her jaw pain has been going on for 3 days. Worse with swallowing. Does improve if she takes ibuprofen and tylenol. She has been seen in the ED multiple times recently for chest pain. Workup has always been negative. She saw Dr. Jacinto Halim on 2/18 and he suspected possible costochondritis. He did order coronary CTA as patient is very anxious about her symptoms. Currently, her jaw pain is not concerning for a cardiac source as it is related to swallowing and has been relieved by tylenol and NSAIDs.   I discussed case with Dr. Jacinto Halim who plans to send her in a prescription for pain medication. I also encouraged her to use aleve and tylenol to manage her symptoms.   Jonita Albee, PA-C 12/16/2023 3:34 PM

## 2023-12-16 NOTE — Addendum Note (Signed)
 Addended by: Delrae Rend on: 12/16/2023 06:03 PM   Modules accepted: Orders

## 2023-12-18 ENCOUNTER — Ambulatory Visit
Admission: RE | Admit: 2023-12-18 | Discharge: 2023-12-18 | Disposition: A | Discharge status: Home / Self Care | Payer: Managed Care, Other (non HMO) | Source: Ambulatory Visit | Attending: Cardiology | Admitting: Cardiology

## 2023-12-18 ENCOUNTER — Encounter: Payer: Self-pay | Admitting: Cardiology

## 2023-12-18 DIAGNOSIS — R072 Precordial pain: Secondary | ICD-10-CM | POA: Insufficient documentation

## 2023-12-18 DIAGNOSIS — E7800 Pure hypercholesterolemia, unspecified: Secondary | ICD-10-CM | POA: Insufficient documentation

## 2023-12-18 DIAGNOSIS — R002 Palpitations: Secondary | ICD-10-CM | POA: Diagnosis present

## 2023-12-18 DIAGNOSIS — H0260 Xanthelasma of unspecified eye, unspecified eyelid: Secondary | ICD-10-CM | POA: Insufficient documentation

## 2023-12-18 MED ORDER — METOPROLOL TARTRATE 5 MG/5ML IV SOLN
10.0000 mg | Freq: Once | INTRAVENOUS | Status: DC | PRN
  Filled 2023-12-18: qty 10

## 2023-12-18 MED ORDER — NITROGLYCERIN 0.4 MG SL SUBL
0.8000 mg | SUBLINGUAL_TABLET | Freq: Once | SUBLINGUAL | Status: AC
  Administered 2023-12-18: 0.8 mg via SUBLINGUAL
  Filled 2023-12-18: qty 25

## 2023-12-18 MED ORDER — NITROGLYCERIN 0.4 MG SL SUBL
SUBLINGUAL_TABLET | SUBLINGUAL | Status: AC
  Filled 2023-12-18: qty 2

## 2023-12-18 MED ORDER — DILTIAZEM HCL 25 MG/5ML IV SOLN
10.0000 mg | INTRAVENOUS | Status: DC | PRN
  Filled 2023-12-18: qty 5

## 2023-12-18 MED ORDER — IOHEXOL 350 MG/ML SOLN
80.0000 mL | Freq: Once | INTRAVENOUS | Status: AC | PRN
  Administered 2023-12-18: 80 mL via INTRAVENOUS

## 2023-12-18 NOTE — Progress Notes (Signed)
 Normal coronary CTA

## 2024-03-05 ENCOUNTER — Ambulatory Visit: Payer: Managed Care, Other (non HMO) | Admitting: Cardiology

## 2024-04-16 ENCOUNTER — Ambulatory Visit (INDEPENDENT_AMBULATORY_CARE_PROVIDER_SITE_OTHER): Admitting: Physician Assistant

## 2024-04-16 ENCOUNTER — Encounter: Payer: Self-pay | Admitting: Physician Assistant

## 2024-04-16 VITALS — BP 138/80 | HR 90 | Ht 65.0 in | Wt 144.2 lb

## 2024-04-16 DIAGNOSIS — R140 Abdominal distension (gaseous): Secondary | ICD-10-CM | POA: Diagnosis not present

## 2024-04-16 DIAGNOSIS — R194 Change in bowel habit: Secondary | ICD-10-CM | POA: Diagnosis not present

## 2024-04-16 DIAGNOSIS — R4589 Other symptoms and signs involving emotional state: Secondary | ICD-10-CM

## 2024-04-16 MED ORDER — NA SULFATE-K SULFATE-MG SULF 17.5-3.13-1.6 GM/177ML PO SOLN: 1.0000 | Freq: Once | ORAL | 0 refills | Status: AC

## 2024-04-16 NOTE — Progress Notes (Signed)
 Ellouise Console, PA-C [address] [place], [place]  [address] Phone: [phone]   Gastroenterology Consultation  Referring Provider:     Shayne Anes, MD Primary Care Physician:  Shayne Anes, MD Primary Gastroenterologist:  Ellouise Console, PA-C / Norleen Kiang, MD  Reason for Consultation:     Abdominal bloating, Thin stools        HPI:   Gabrielle Zavala is a 38 y.o. y/o female referred for consultation & management  by Shayne Anes, MD. Patient is referred to evaluate abdominal bloating and thin stools.  Patient is requesting Dr. Kiang.  No previous GI evaluation or colonoscopy.  Patient saw her PCP 04/03/2024 to evaluate change in bowel habits.  Patient noted pencil thin stools.  Digital rectal exam was normal.  Current symptoms: Patient states for the past month she has noticed a very narrow small pencil thin stools.  She is fearful about having colon cancer or a bowel blockage.  She tried magnesium  citrate for a few nights which made her stools loose and mushy.  She declines to take MiraLAX.  She has not had any hard stools or straining.  She has a feeling of incomplete bowel evacuation.  She denies rectal bleeding, black stools, abdominal pain, weight loss.  No family history of colon cancer.  She also admits to increased belching, bloating, and gas.  She is extremely anxious about her health.  She does not take any regular medications.  No family history of colon cancer.  11/2023 labs: Normal TSH, CMP, and CBC.  Hgb 13.6, no anemia.  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 Authorizing Provider  Magnesium  Glycinate 120 MG CAPS Take 1 capsule by mouth daily.    [provider]  metoprolol  tartrate (LOPRESSOR ) 100 MG tablet Take one tablet (100 mg) by mouth 2 hours prior to your cardiac CT. 12/05/23   Ladona Heinz, MD  Multiple Vitamin (MULTIVITAMIN) tablet Take 1 tablet by mouth daily.    [provider]  oxyCODONE -acetaminophen  (PERCOCET) 5-325 MG tablet Take 1 tablet by mouth every 8 (eight) hours as needed for severe pain (pain score 7-10). 12/16/23 12/15/24  Ladona Heinz, MD    Family History  Problem Relation Age of Onset   Liver disease Mother    Breast cancer Mother 53   Diabetes Maternal Uncle      Social History   Tobacco Use   Smoking status: Never   Smokeless tobacco: Never  Substance Use Topics   Alcohol use: No    Comment: 3-4 times a week   Drug use: No    Comment: past use    Allergies as of 04/16/2024 - Review Complete 04/16/2024  Allergen Reaction Noted   Desvenlafaxine  10/30/2023   Fluoxetine Anxiety 10/30/2023    Review of Systems:    All systems reviewed and negative except where noted in HPI.  Physical Exam:  BP 138/80   Pulse 90   Ht 5' 5 (1.651 m)   Wt 144 lb 3.2 oz (65.4 kg)   LMP 04/01/2024 (Approximate)   BMI 24.00 kg/m  Patient's last menstrual period was 04/01/2024 (approximate).  General:   Alert,  Well-developed, well-nourished, pleasant and cooperative in NAD Lungs:  Respirations even and unlabored.  Clear throughout to auscultation.   No wheezes, crackles, or rhonchi. No acute distress. Heart:  Regular rate and rhythm; no murmurs, clicks, rubs, or gallops. Abdomen:  Normal bowel sounds.  No bruits.  Soft, and non-distended without masses, hepatosplenomegaly or hernias noted.  No Tenderness.  No guarding or rebound tenderness.    Neurologic:  Alert and oriented x3;  grossly normal  neurologically. Psych:  Alert and cooperative.  Extremely anxious and tearful mood and affect.  Imaging Studies: No results found.  Labs: CBC    Component Value Date/Time   WBC 6.3 11/21/2023 2138   RBC 4.46 11/21/2023 2138   HGB 13.6 11/21/2023 2138   HCT 41.2 11/21/2023 2138   PLT 213 11/21/2023 2138   MCV 92.4 11/21/2023 2138   MCH 30.5 11/21/2023 2138   MCHC 33.0 11/21/2023 2138   RDW 12.6 11/21/2023 2138   LYMPHSABS 2.2 11/21/2023 2138   MONOABS 0.4 11/21/2023 2138   EOSABS 0.2 11/21/2023 2138   BASOSABS 0.1 11/21/2023 2138    CMP     Component Value Date/Time   NA 138 11/21/2023 2138   K 3.7 11/21/2023 2138   CL 102 11/21/2023 2138   CO2 25 11/21/2023 2138   GLUCOSE 102 (H) 11/21/2023 2138   BUN 11 11/21/2023 2138   CREATININE 0.73 11/21/2023 2138   CREATININE 0.76 07/31/2014 1146   CALCIUM  9.5 11/21/2023 2138   PROT 7.1 11/21/2023 2138   ALBUMIN 4.3 11/21/2023 2138   AST 22 11/21/2023 2138   ALT 26 11/21/2023 2138   ALKPHOS 48 11/21/2023 2138   BILITOT 0.4 11/21/2023 2138   GFRNONAA >60 11/21/2023 2138   GFRAA >60 10/15/2017 1018    Assessment and Plan:   Gabrielle Zavala is a 38 y.o. y/o female has been referred for:   1.  Abdominal bloating 2.  Change in bowel habits; thin stools 3.  Anxiety about her health  Plan: - Scheduling Colonoscopy to evaluate change in bowel habits. - Schedule colonoscopy with Dr. Abran per patient request. I discussed risks of colonoscopy with patient to include risk of bleeding, colon perforation, and risk of sedation.  Patient expressed understanding and agrees to proceed with colonoscopy.  - I recommended she take OTC fiber supplement such as Benefiber daily. - Gave patient reassurance regarding normal lab results.  Follow up will be based on colonoscopy results.  Ellouise Console, PA-C

## 2024-04-16 NOTE — Progress Notes (Signed)
 Noted

## 2024-04-16 NOTE — Patient Instructions (Signed)
 You have been scheduled for a colonoscopy. Please follow written instructions given to you at your visit today.   If you use inhalers (even only as needed), please bring them with you on the day of your procedure.  DO NOT TAKE 7 DAYS PRIOR TO TEST- Trulicity (dulaglutide) Ozempic, Wegovy (semaglutide) Mounjaro (tirzepatide) Bydureon Bcise (exanatide extended release)  DO NOT TAKE 1 DAY PRIOR TO YOUR TEST Rybelsus (semaglutide) Adlyxin (lixisenatide) Victoza (liraglutide) Byetta (exanatide) ___________________________________________________________________________

## 2024-05-09 ENCOUNTER — Telehealth: Payer: Self-pay | Admitting: Internal Medicine

## 2024-05-09 NOTE — Telephone Encounter (Signed)
 error

## 2024-05-20 ENCOUNTER — Ambulatory Visit (AMBULATORY_SURGERY_CENTER): Admitting: Internal Medicine

## 2024-05-20 ENCOUNTER — Encounter: Admitting: Internal Medicine

## 2024-05-20 ENCOUNTER — Encounter: Payer: Self-pay | Admitting: Internal Medicine

## 2024-05-20 VITALS — BP 127/67 | HR 74 | Temp 99.3°F | Resp 16 | Ht 65.0 in | Wt 144.0 lb

## 2024-05-20 DIAGNOSIS — R195 Other fecal abnormalities: Secondary | ICD-10-CM | POA: Diagnosis not present

## 2024-05-20 DIAGNOSIS — R140 Abdominal distension (gaseous): Secondary | ICD-10-CM | POA: Diagnosis not present

## 2024-05-20 DIAGNOSIS — R194 Change in bowel habit: Secondary | ICD-10-CM | POA: Diagnosis present

## 2024-05-20 MED ORDER — SODIUM CHLORIDE 0.9 % IV SOLN: 500.0000 mL | Freq: Once | INTRAVENOUS | Status: DC

## 2024-05-20 NOTE — Patient Instructions (Addendum)
Resume previous diet Continue present medications There were no colon polyps seen today!   You will need another screening colonoscopy in 10 years, you will receive a letter at that time when you are due for the procedure.  Please call us at [phone] if you have a change in bowel habits, change in family history of colo-rectal cancer, rectal bleeding or other GI concern before that time.  YOU HAD AN ENDOSCOPIC PROCEDURE TODAY AT THE  ENDOSCOPY CENTER:   Refer to the procedure report that was given to you for any specific questions about what was found during the examination.  If the procedure report does not answer your questions, please call your gastroenterologist to clarify.  If you requested that your care partner not be given the details of your procedure findings, then the procedure report has been included in a sealed envelope for you to review at your convenience later.  YOU SHOULD EXPECT: Some feelings of bloating in the abdomen. Passage of more gas than usual.  Walking can help get rid of the air that was put into your GI tract during the procedure and reduce the bloating. If you had a lower endoscopy (such as a colonoscopy or flexible sigmoidoscopy) you may notice spotting of blood in your stool or on the toilet paper. If you underwent a bowel prep for your procedure, you may not have a normal bowel movement for a few days.  Please Note:  You might notice some irritation and congestion in your nose or some drainage.  This is from the oxygen used during your procedure.  There is no need for concern and it should clear up in a day or so.  SYMPTOMS TO REPORT IMMEDIATELY:  Following lower endoscopy (colonoscopy):  Excessive amounts of blood in the stool  Significant tenderness or worsening of abdominal pains  Swelling of the abdomen that is new, acute  Fever of 100F or higher  For urgent or emergent issues, a gastroenterologist can be reached at any hour by calling [phone]  [phone]. Do not use MyChart messaging for urgent concerns.   DIET:  We do recommend a small meal at first, but then you may proceed to your regular diet.  Drink plenty of fluids but you should avoid alcoholic beverages for 24 hours.  ACTIVITY:  You should plan to take it easy for the rest of today and you should NOT DRIVE or use heavy machinery until tomorrow (because of the sedation medicines used during the test).    FOLLOW UP: Our staff will call the number listed on your records the next business day following your procedure.  We will call around 7:15- 8:00 am to check on you and address any questions or concerns that you may have regarding the information given to you following your procedure. If we do not reach you, we will leave a message.     SIGNATURES/CONFIDENTIALITY: You and/or your care partner have signed paperwork which will be entered into your electronic medical record.  These signatures attest to the fact that that the information above on your After Visit Summary has been reviewed and is understood.  Full responsibility of the confidentiality of this discharge information lies with you and/or your care-partner.

## 2024-05-20 NOTE — Progress Notes (Signed)
 Expand All Collapse All       Ellouise Console, PA-C [address] [place], [place]  [address] Phone: [phone]   Gastroenterology Consultation   Referring Provider:     Shayne Anes, MD Primary Care Physician:  Shayne Anes, MD Primary Gastroenterologist:  Ellouise Console, PA-C / Norleen Kiang, MD  Reason for Consultation:     Abdominal bloating, Thin stools        HPI:   Gabrielle Zavala is a 38 y.o. y/o female referred for consultation & management  by Shayne Anes, MD. Patient is referred to evaluate abdominal bloating and thin stools.  Patient is requesting Dr. Kiang.  No previous GI evaluation or colonoscopy.   Patient saw her PCP 04/03/2024 to evaluate change in bowel habits.  Patient noted pencil thin stools.  Digital rectal exam was normal.   Current symptoms: Patient states for the past month she has noticed a very narrow small pencil thin stools.  She is fearful about having colon cancer or a bowel blockage.  She tried magnesium  citrate for a few nights which made her stools loose and mushy.  She declines to take MiraLAX.  She has not had any hard stools or straining.  She has a feeling of incomplete bowel evacuation.  She denies rectal bleeding, black stools, abdominal pain, weight loss.  No family history of colon cancer.  She also admits to increased belching, bloating, and gas.  She is extremely anxious about her health.  She does not take any regular medications.   No family history of colon cancer.   11/2023 labs: Normal TSH, CMP, and CBC.  Hgb 13.6, no anemia.       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 Authorizing Provider  Magnesium  Glycinate 120 MG CAPS Take 1 capsule by mouth daily.       [provider]  metoprolol  tartrate (LOPRESSOR ) 100 MG tablet Take one tablet (100 mg) by mouth 2 hours prior to your cardiac CT. 12/05/23     Ladona Heinz, MD  Multiple Vitamin (MULTIVITAMIN) tablet Take 1 tablet by mouth daily.       [provider]  oxyCODONE -acetaminophen  (PERCOCET) 5-325 MG tablet Take 1 tablet by mouth every 8 (eight) hours as needed for severe pain (pain score 7-10). 12/16/23 12/15/24   Ladona Heinz, MD           Family History  Problem Relation Age of Onset   Liver disease Mother     Breast cancer Mother 80   Diabetes Maternal Uncle            Social History  Social History         Tobacco Use  Smoking status: Never   Smokeless tobacco: Never  Substance Use Topics   Alcohol use: No      Comment: 3-4 times a week   Drug use: No      Comment: past use             Allergies as of 04/16/2024 - Review Complete 04/16/2024  Allergen Reaction Noted   Desvenlafaxine   10/30/2023   Fluoxetine Anxiety 10/30/2023      Review of Systems:    All systems reviewed and negative except where noted in HPI.    Physical Exam:  BP 138/80   Pulse 90   Ht 5' 5 (1.651 m)   Wt 144 lb 3.2 oz (65.4 kg)   LMP 04/01/2024 (Approximate)   BMI 24.00 kg/m  Patient's last menstrual period was 04/01/2024 (approximate).   General:   Alert,  Well-developed, well-nourished, pleasant and cooperative in NAD Lungs:  Respirations even and unlabored.  Clear throughout to auscultation.   No wheezes, crackles, or rhonchi. No acute distress. Heart:  Regular rate and rhythm; no murmurs, clicks, rubs, or gallops. Abdomen:  Normal bowel sounds.  No bruits.  Soft, and non-distended without masses,  hepatosplenomegaly or hernias noted.  No Tenderness.  No guarding or rebound tenderness.    Neurologic:  Alert and oriented x3;  grossly normal neurologically. Psych:  Alert and cooperative.  Extremely anxious and tearful mood and affect.   Imaging Studies: Imaging Results  No results found.     Labs: CBC Labs (Brief)          Component Value Date/Time    WBC 6.3 11/21/2023 2138    RBC 4.46 11/21/2023 2138    HGB 13.6 11/21/2023 2138    HCT 41.2 11/21/2023 2138    PLT 213 11/21/2023 2138    MCV 92.4 11/21/2023 2138    MCH 30.5 11/21/2023 2138    MCHC 33.0 11/21/2023 2138    RDW 12.6 11/21/2023 2138    LYMPHSABS 2.2 11/21/2023 2138    MONOABS 0.4 11/21/2023 2138    EOSABS 0.2 11/21/2023 2138    BASOSABS 0.1 11/21/2023 2138        CMP     Labs (Brief)          Component Value Date/Time    NA 138 11/21/2023 2138    K 3.7 11/21/2023 2138    CL 102 11/21/2023 2138    CO2 25 11/21/2023 2138    GLUCOSE 102 (H) 11/21/2023 2138    BUN 11 11/21/2023 2138    CREATININE 0.73 11/21/2023 2138    CREATININE 0.76 07/31/2014 1146    CALCIUM  9.5 11/21/2023 2138    PROT 7.1 11/21/2023 2138    ALBUMIN 4.3 11/21/2023 2138    AST 22 11/21/2023 2138    ALT 26 11/21/2023 2138    ALKPHOS 48 11/21/2023 2138    BILITOT 0.4 11/21/2023 2138    GFRNONAA >60 11/21/2023 2138    GFRAA >60 10/15/2017 1018        Assessment and Plan:    Gabrielle Zavala is a 38 y.o. y/o female has been referred for:    1.  Abdominal bloating 2.  Change in bowel habits; thin stools 3.  Anxiety about her health   Plan: - Scheduling Colonoscopy to evaluate change in bowel habits. - Schedule colonoscopy with Dr. Abran per patient request. I discussed risks of colonoscopy with patient to include risk of bleeding, colon perforation, and risk of sedation.  Patient expressed understanding  and agrees to proceed with colonoscopy.  - I recommended she take OTC fiber supplement such as Benefiber daily. - Gave  patient reassurance regarding normal lab results.   Follow up will be based on colonoscopy results.   Ellouise Console, PA-C

## 2024-05-20 NOTE — Progress Notes (Signed)
 A/o x 3, VSS, gd SR's, pleased with anesthesia, report to RN

## 2024-05-20 NOTE — Op Note (Signed)
 [facility] Patient Name: Gabrielle Zavala Procedure Date: 05/20/2024 9:39 AM MRN: [id number] Endoscopist: Norleen SAILOR. Abran , MD, [id number] Age: 38 Referring MD:  Date of Birth: August 07, 1986 Gender: Female Account #: [id number] Procedure:                Colonoscopy Indications:              Change in bowel habits, change in stool caliber,                            abdominal bloating Medicines:                Monitored Anesthesia Care Procedure:                Pre-Anesthesia Assessment:                           - Prior to the procedure, a History and Physical                            was performed, and patient medications and                            allergies were reviewed. The patient's tolerance of                            previous anesthesia was also reviewed. The risks                            and benefits of the procedure and the sedation                            options and risks were discussed with the patient.                            All questions were answered, and informed consent                            was obtained. Prior Anticoagulants: The patient has                            taken no anticoagulant or antiplatelet agents. ASA                            Grade Assessment: I - A normal, healthy patient.                            After reviewing the risks and benefits, the patient                            was deemed in satisfactory condition to undergo the                            procedure.  After obtaining informed consent, the colonoscope                            was passed under direct vision. Throughout the                            procedure, the patient's blood pressure, pulse, and                            oxygen saturations were monitored continuously. The                            Olympus Scope SN: [id number] was introduced through                            the anus and advanced to the the cecum, identified                             by appendiceal orifice and ileocecal valve. The                            ileocecal valve, appendiceal orifice, and rectum                            were photographed. The quality of the bowel                            preparation was excellent. The colonoscopy was                            performed without difficulty. The patient tolerated                            the procedure well. The bowel preparation used was                            SUPREP via split dose instruction. Scope In: 9:47:02 AM Scope Out: 9:59:55 AM Scope Withdrawal Time: 0 hours 9 minutes 33 seconds  Total Procedure Duration: 0 hours 12 minutes 53 seconds  Findings:                 The entire examined colon appeared normal on direct                            and retroflexion views. Complications:            No immediate complications. Estimated blood loss:                            None. Estimated Blood Loss:     Estimated blood loss: none. Impression:               - The entire examined colon is normal on direct and  retroflexion views.                           - No specimens collected. Recommendation:           - Repeat colonoscopy in 10 years for screening                            purposes.                           - Patient has a contact number available for                            emergencies. The signs and symptoms of potential                            delayed complications were discussed with the                            patient. Return to normal activities tomorrow.                            Written discharge instructions were provided to the                            patient.                           - Resume previous diet.                           - Continue present medications. Norleen SAILOR. Abran, MD 05/20/2024 10:18:55 AM This report has been signed electronically.

## 2024-05-21 ENCOUNTER — Telehealth: Payer: Self-pay

## 2024-05-21 NOTE — Telephone Encounter (Signed)
  Follow up Call-     05/20/2024    8:58 AM  Call back number  Post procedure Call Back phone  # [phone]  Permission to leave phone message Yes     Patient questions:  Do you have a fever, pain , or abdominal swelling? No. Pain Score  0 *  Have you tolerated food without any problems? Yes.    Have you been able to return to your normal activities? Yes.    Do you have any questions about your discharge instructions: Diet   No. Medications  No. Follow up visit  No.  Do you have questions or concerns about your Care? No.  Actions: * If pain score is 4 or above: No action needed, pain <4.

## 2024-06-11 ENCOUNTER — Encounter: Admitting: Internal Medicine
